# Patient Record
Sex: Female | Born: 1955 | Race: Black or African American | Hispanic: No | Smoking: Former smoker
Health system: Southern US, Community
[De-identification: ages and names within clinical notes are randomized; demographics above are authoritative.]

## PROBLEM LIST (undated history)

## (undated) DIAGNOSIS — E119 Type 2 diabetes mellitus without complications: Secondary | ICD-10-CM

## (undated) DIAGNOSIS — I10 Essential (primary) hypertension: Secondary | ICD-10-CM

## (undated) DIAGNOSIS — H269 Unspecified cataract: Secondary | ICD-10-CM

## (undated) DIAGNOSIS — G40909 Epilepsy, unspecified, not intractable, without status epilepticus: Secondary | ICD-10-CM

## (undated) DIAGNOSIS — Z9289 Personal history of other medical treatment: Secondary | ICD-10-CM

## (undated) DIAGNOSIS — M109 Gout, unspecified: Secondary | ICD-10-CM

## (undated) DIAGNOSIS — R569 Unspecified convulsions: Secondary | ICD-10-CM

## (undated) DIAGNOSIS — E785 Hyperlipidemia, unspecified: Secondary | ICD-10-CM

## (undated) HISTORY — PX: CATARACT EXTRACTION: SUR2

## (undated) HISTORY — PX: CATARACT EXTRACTION W/ INTRAOCULAR LENS  IMPLANT, BILATERAL: SHX1307

## (undated) HISTORY — PX: APPENDECTOMY: SHX54

## (undated) HISTORY — DX: Hyperlipidemia, unspecified: E78.5

## (undated) HISTORY — PX: TUBAL LIGATION: SHX77

## (undated) HISTORY — PX: ABDOMINAL HYSTERECTOMY: SHX81

## (undated) HISTORY — DX: Unspecified cataract: H26.9

---

## 1998-09-15 ENCOUNTER — Ambulatory Visit (HOSPITAL_COMMUNITY): Admission: RE | Admit: 1998-09-15 | Discharge: 1998-09-15 | Payer: Self-pay | Admitting: Internal Medicine

## 1998-09-15 ENCOUNTER — Encounter: Payer: Self-pay | Admitting: Internal Medicine

## 2001-05-31 ENCOUNTER — Emergency Department (HOSPITAL_COMMUNITY): Admission: EM | Admit: 2001-05-31 | Discharge: 2001-05-31 | Payer: Self-pay | Admitting: Emergency Medicine

## 2002-06-29 ENCOUNTER — Emergency Department (HOSPITAL_COMMUNITY): Admission: EM | Admit: 2002-06-29 | Discharge: 2002-06-30 | Payer: Self-pay | Admitting: Emergency Medicine

## 2003-03-01 ENCOUNTER — Other Ambulatory Visit: Admission: RE | Admit: 2003-03-01 | Discharge: 2003-03-01 | Payer: Self-pay | Admitting: Obstetrics and Gynecology

## 2005-04-05 ENCOUNTER — Inpatient Hospital Stay (HOSPITAL_COMMUNITY): Admission: EM | Admit: 2005-04-05 | Discharge: 2005-04-06 | Payer: Self-pay | Admitting: Emergency Medicine

## 2005-04-06 ENCOUNTER — Encounter (INDEPENDENT_AMBULATORY_CARE_PROVIDER_SITE_OTHER): Payer: Self-pay | Admitting: Cardiovascular Disease

## 2008-01-25 ENCOUNTER — Observation Stay (HOSPITAL_COMMUNITY): Admission: EM | Admit: 2008-01-25 | Discharge: 2008-01-26 | Payer: Self-pay | Admitting: Emergency Medicine

## 2008-02-02 ENCOUNTER — Ambulatory Visit: Payer: Self-pay | Admitting: *Deleted

## 2008-02-09 ENCOUNTER — Encounter (INDEPENDENT_AMBULATORY_CARE_PROVIDER_SITE_OTHER): Payer: Self-pay | Admitting: Internal Medicine

## 2008-02-09 ENCOUNTER — Ambulatory Visit: Payer: Self-pay | Admitting: Family Medicine

## 2008-03-10 ENCOUNTER — Ambulatory Visit: Payer: Self-pay | Admitting: Family Medicine

## 2008-03-12 ENCOUNTER — Ambulatory Visit: Payer: Self-pay | Admitting: Family Medicine

## 2008-04-01 ENCOUNTER — Ambulatory Visit: Payer: Self-pay | Admitting: Internal Medicine

## 2008-04-19 ENCOUNTER — Encounter: Payer: Self-pay | Admitting: Family Medicine

## 2008-04-19 ENCOUNTER — Ambulatory Visit: Payer: Self-pay | Admitting: Internal Medicine

## 2008-04-19 LAB — CONVERTED CEMR LAB
ALT: 10 units/L (ref 0–35)
Albumin: 4.2 g/dL (ref 3.5–5.2)
Basophils Relative: 0 % (ref 0–1)
Calcium: 8.7 mg/dL (ref 8.4–10.5)
Carbamazepine Lvl: 2.8 ug/mL — ABNORMAL LOW (ref 4.0–12.0)
Creatinine, Ser: 0.59 mg/dL (ref 0.40–1.20)
HCT: 37.3 % (ref 36.0–46.0)
Hemoglobin: 12 g/dL (ref 12.0–15.0)
Lymphs Abs: 2.3 10*3/uL (ref 0.7–4.0)
MCHC: 32.2 g/dL (ref 30.0–36.0)
MCV: 84.8 fL (ref 78.0–100.0)
Monocytes Absolute: 0.4 10*3/uL (ref 0.1–1.0)
Monocytes Relative: 6 % (ref 3–12)
Neutro Abs: 3.6 10*3/uL (ref 1.7–7.7)
Platelets: 298 10*3/uL (ref 150–400)
Potassium: 3.8 meq/L (ref 3.5–5.3)
RDW: 13.9 % (ref 11.5–15.5)
Sodium: 141 meq/L (ref 135–145)
Total Protein: 7 g/dL (ref 6.0–8.3)
WBC: 6.6 10*3/uL (ref 4.0–10.5)

## 2008-04-20 ENCOUNTER — Encounter: Payer: Self-pay | Admitting: Family Medicine

## 2008-06-14 ENCOUNTER — Ambulatory Visit: Payer: Self-pay | Admitting: *Deleted

## 2008-10-22 ENCOUNTER — Ambulatory Visit: Payer: Self-pay | Admitting: Family Medicine

## 2009-01-13 ENCOUNTER — Ambulatory Visit: Payer: Self-pay | Admitting: Family Medicine

## 2009-01-13 LAB — CONVERTED CEMR LAB
ALT: 25 units/L (ref 0–35)
Albumin: 4.4 g/dL (ref 3.5–5.2)
Alkaline Phosphatase: 71 units/L (ref 39–117)
BUN: 12 mg/dL (ref 6–23)
Basophils Absolute: 0 10*3/uL (ref 0.0–0.1)
Basophils Relative: 1 % (ref 0–1)
CO2: 25 meq/L (ref 19–32)
Calcium: 9.5 mg/dL (ref 8.4–10.5)
Carbamazepine Lvl: <0.3 ug/mL — ABNORMAL LOW (ref 4.0–12.0)
Chloride: 104 meq/L (ref 96–112)
Chloride: 107 meq/L (ref 96–112)
Eosinophils Relative: 2 % (ref 0–5)
Eosinophils Relative: 7 % — ABNORMAL HIGH (ref 0–5)
HCT: 41.2 % (ref 36.0–46.0)
HDL: 60 mg/dL (ref >39)
HDL: 79 mg/dL (ref >39)
Hemoglobin: 12.4 g/dL (ref 12.0–15.0)
LDL Cholesterol: 141 mg/dL — ABNORMAL HIGH (ref 0–99)
Lymphs Abs: 2.3 10*3/uL (ref 0.7–4.0)
MCHC: 30.1 g/dL (ref 30.0–36.0)
Monocytes Absolute: 0.3 10*3/uL (ref 0.1–1.0)
Monocytes Absolute: 0.5 10*3/uL (ref 0.1–1.0)
Monocytes Relative: 6 % (ref 3–12)
Neutro Abs: 2.4 10*3/uL (ref 1.7–7.7)
Neutro Abs: 3.1 10*3/uL (ref 1.7–7.7)
Neutrophils Relative %: 44 % (ref 43–77)
Neutrophils Relative %: 52 % (ref 43–77)
RBC: 4.4 M/uL (ref 3.87–5.11)
RDW: 13.6 % (ref 11.5–15.5)
RDW: 14.1 % (ref 11.5–15.5)
Sodium: 143 meq/L (ref 135–145)
TSH: 1.806 microintl units/mL (ref 0.350–4.50)
Total Bilirubin: 0.4 mg/dL (ref 0.3–1.2)
Total Protein: 7.2 g/dL (ref 6.0–8.3)
Triglycerides: 111 mg/dL (ref <150)
Triglycerides: 151 mg/dL — ABNORMAL HIGH (ref <150)
WBC: 6.1 10*3/uL (ref 4.0–10.5)

## 2009-02-04 ENCOUNTER — Ambulatory Visit: Payer: Self-pay | Admitting: Internal Medicine

## 2009-03-03 ENCOUNTER — Emergency Department (HOSPITAL_COMMUNITY): Admission: EM | Admit: 2009-03-03 | Discharge: 2009-03-03 | Payer: Self-pay | Admitting: Emergency Medicine

## 2009-03-18 ENCOUNTER — Ambulatory Visit: Payer: Self-pay | Admitting: Family Medicine

## 2009-03-24 HISTORY — PX: CHOLECYSTECTOMY: SHX55

## 2009-03-30 ENCOUNTER — Encounter (INDEPENDENT_AMBULATORY_CARE_PROVIDER_SITE_OTHER): Payer: Self-pay | Admitting: General Surgery

## 2009-03-30 ENCOUNTER — Inpatient Hospital Stay (HOSPITAL_COMMUNITY): Admission: EM | Admit: 2009-03-30 | Discharge: 2009-04-05 | Payer: Self-pay | Admitting: Emergency Medicine

## 2009-04-22 ENCOUNTER — Ambulatory Visit: Payer: Self-pay | Admitting: Internal Medicine

## 2009-06-29 ENCOUNTER — Ambulatory Visit (HOSPITAL_COMMUNITY): Admission: RE | Admit: 2009-06-29 | Discharge: 2009-06-29 | Payer: Self-pay | Admitting: Family Medicine

## 2009-06-29 ENCOUNTER — Ambulatory Visit: Payer: Self-pay | Admitting: Cardiovascular Disease

## 2009-06-29 ENCOUNTER — Encounter (INDEPENDENT_AMBULATORY_CARE_PROVIDER_SITE_OTHER): Payer: Self-pay | Admitting: Obstetrics & Gynecology

## 2009-07-06 ENCOUNTER — Ambulatory Visit: Payer: Self-pay | Admitting: Internal Medicine

## 2009-07-14 ENCOUNTER — Ambulatory Visit: Payer: Self-pay | Admitting: Internal Medicine

## 2009-07-18 ENCOUNTER — Ambulatory Visit: Payer: Self-pay | Admitting: Internal Medicine

## 2009-09-12 ENCOUNTER — Ambulatory Visit: Payer: Self-pay | Admitting: Family Medicine

## 2009-09-21 ENCOUNTER — Ambulatory Visit: Payer: Self-pay | Admitting: *Deleted

## 2009-12-19 ENCOUNTER — Emergency Department (HOSPITAL_COMMUNITY): Admission: EM | Admit: 2009-12-19 | Discharge: 2009-12-19 | Payer: Self-pay | Admitting: Emergency Medicine

## 2009-12-26 ENCOUNTER — Emergency Department (HOSPITAL_COMMUNITY): Admission: EM | Admit: 2009-12-26 | Discharge: 2009-12-26 | Payer: Self-pay | Admitting: Family Medicine

## 2009-12-26 ENCOUNTER — Observation Stay (HOSPITAL_COMMUNITY): Admission: EM | Admit: 2009-12-26 | Discharge: 2009-12-27 | Payer: Self-pay | Admitting: Emergency Medicine

## 2009-12-28 ENCOUNTER — Emergency Department (HOSPITAL_COMMUNITY): Admission: EM | Admit: 2009-12-28 | Discharge: 2009-12-28 | Payer: Self-pay | Admitting: Emergency Medicine

## 2010-01-10 ENCOUNTER — Ambulatory Visit: Payer: Self-pay | Admitting: Family Medicine

## 2010-01-12 ENCOUNTER — Ambulatory Visit: Payer: Self-pay | Admitting: Internal Medicine

## 2010-01-16 ENCOUNTER — Encounter (INDEPENDENT_AMBULATORY_CARE_PROVIDER_SITE_OTHER): Payer: Self-pay | Admitting: Adult Health

## 2010-01-16 ENCOUNTER — Ambulatory Visit: Payer: Self-pay | Admitting: Family Medicine

## 2010-01-16 LAB — CONVERTED CEMR LAB
AST: 36 units/L (ref 0–37)
Albumin: 4.8 g/dL (ref 3.5–5.2)
Alkaline Phosphatase: 75 units/L (ref 39–117)
BUN: 17 mg/dL (ref 6–23)
Chloride: 104 meq/L (ref 96–112)
Creatinine, Ser: 0.7 mg/dL (ref 0.40–1.20)
Hemoglobin: 12.2 g/dL (ref 12.0–15.0)
LDL Cholesterol: 70 mg/dL (ref 0–99)
Lymphs Abs: 2.7 10*3/uL (ref 0.7–4.0)
MCV: 83 fL (ref 78.0–100.0)
Monocytes Absolute: 0.4 10*3/uL (ref 0.1–1.0)
Neutrophils Relative %: 40 % — ABNORMAL LOW (ref 43–77)
Platelets: 344 10*3/uL (ref 150–400)
Potassium: 4.2 meq/L (ref 3.5–5.3)
RBC: 4.7 M/uL (ref 3.87–5.11)
RDW: 13.3 % (ref 11.5–15.5)
TSH: 0.731 microintl units/mL (ref 0.350–4.500)
WBC: 5.5 10*3/uL (ref 4.0–10.5)

## 2010-02-13 ENCOUNTER — Ambulatory Visit: Payer: Self-pay | Admitting: Internal Medicine

## 2010-03-01 ENCOUNTER — Encounter (INDEPENDENT_AMBULATORY_CARE_PROVIDER_SITE_OTHER): Payer: Self-pay | Admitting: Adult Health

## 2010-03-01 ENCOUNTER — Ambulatory Visit: Payer: Self-pay | Admitting: Internal Medicine

## 2010-03-01 LAB — CONVERTED CEMR LAB: TSH: 0.726 microintl units/mL (ref 0.350–4.500)

## 2010-03-22 ENCOUNTER — Ambulatory Visit: Payer: Self-pay | Admitting: Internal Medicine

## 2010-06-16 ENCOUNTER — Emergency Department (HOSPITAL_COMMUNITY): Admission: EM | Admit: 2010-06-16 | Discharge: 2010-06-16 | Payer: Self-pay | Admitting: Emergency Medicine

## 2010-07-19 ENCOUNTER — Emergency Department (HOSPITAL_COMMUNITY): Admission: EM | Admit: 2010-07-19 | Discharge: 2010-07-20 | Payer: Self-pay | Admitting: Emergency Medicine

## 2010-07-22 ENCOUNTER — Inpatient Hospital Stay (HOSPITAL_COMMUNITY): Admission: EM | Admit: 2010-07-22 | Discharge: 2010-07-23 | Payer: Self-pay | Admitting: Emergency Medicine

## 2010-08-14 ENCOUNTER — Ambulatory Visit: Payer: Self-pay | Admitting: Internal Medicine

## 2010-09-05 ENCOUNTER — Ambulatory Visit: Payer: Self-pay | Admitting: Internal Medicine

## 2010-09-05 LAB — CONVERTED CEMR LAB
BUN: 11 mg/dL (ref 6–23)
CO2: 27 meq/L (ref 19–32)
Calcium: 9.3 mg/dL (ref 8.4–10.5)
Chloride: 102 meq/L (ref 96–112)
Glucose, Bld: 143 mg/dL — ABNORMAL HIGH (ref 70–99)
Magnesium: 1.6 mg/dL (ref 1.5–2.5)
Potassium: 4.2 meq/L (ref 3.5–5.3)
Sodium: 141 meq/L (ref 135–145)

## 2010-09-09 ENCOUNTER — Emergency Department (HOSPITAL_COMMUNITY): Admission: EM | Admit: 2010-09-09 | Discharge: 2010-09-09 | Payer: Self-pay | Admitting: Family Medicine

## 2010-10-10 ENCOUNTER — Ambulatory Visit: Payer: Self-pay | Admitting: Internal Medicine

## 2011-01-15 ENCOUNTER — Encounter: Payer: Self-pay | Admitting: Family Medicine

## 2011-01-22 ENCOUNTER — Encounter (INDEPENDENT_AMBULATORY_CARE_PROVIDER_SITE_OTHER): Payer: Self-pay | Admitting: *Deleted

## 2011-01-22 LAB — CONVERTED CEMR LAB
AST: 13 units/L (ref 0–37)
Alkaline Phosphatase: 65 units/L (ref 39–117)
Calcium: 9.2 mg/dL (ref 8.4–10.5)
Carbamazepine Lvl: <0.3 ug/mL — ABNORMAL LOW (ref 4.0–12.0)
Chloride: 102 meq/L (ref 96–112)
Cholesterol: 214 mg/dL — ABNORMAL HIGH (ref 0–200)
Creatinine, Ser: 0.58 mg/dL (ref 0.40–1.20)
HDL: 57 mg/dL (ref >39)
Total Bilirubin: 0.3 mg/dL (ref 0.3–1.2)
Total CHOL/HDL Ratio: 3.8

## 2011-02-17 ENCOUNTER — Emergency Department (HOSPITAL_COMMUNITY)
Admission: EM | Admit: 2011-02-17 | Discharge: 2011-02-17 | Disposition: A | Discharge status: Home / Self Care | Payer: Medicare Other | Attending: Emergency Medicine | Admitting: Emergency Medicine

## 2011-02-17 DIAGNOSIS — E119 Type 2 diabetes mellitus without complications: Secondary | ICD-10-CM | POA: Insufficient documentation

## 2011-02-17 DIAGNOSIS — Z79899 Other long term (current) drug therapy: Secondary | ICD-10-CM | POA: Insufficient documentation

## 2011-02-17 DIAGNOSIS — K117 Disturbances of salivary secretion: Secondary | ICD-10-CM | POA: Insufficient documentation

## 2011-02-17 DIAGNOSIS — F329 Major depressive disorder, single episode, unspecified: Secondary | ICD-10-CM | POA: Insufficient documentation

## 2011-02-17 DIAGNOSIS — I10 Essential (primary) hypertension: Secondary | ICD-10-CM | POA: Insufficient documentation

## 2011-02-17 DIAGNOSIS — R5381 Other malaise: Secondary | ICD-10-CM | POA: Insufficient documentation

## 2011-02-17 DIAGNOSIS — R5383 Other fatigue: Secondary | ICD-10-CM | POA: Insufficient documentation

## 2011-02-17 DIAGNOSIS — E7800 Pure hypercholesterolemia, unspecified: Secondary | ICD-10-CM | POA: Insufficient documentation

## 2011-02-17 DIAGNOSIS — F3289 Other specified depressive episodes: Secondary | ICD-10-CM | POA: Insufficient documentation

## 2011-02-17 DIAGNOSIS — G40909 Epilepsy, unspecified, not intractable, without status epilepticus: Secondary | ICD-10-CM | POA: Insufficient documentation

## 2011-02-17 LAB — CBC
HCT: 35.9 % — ABNORMAL LOW (ref 36.0–46.0)
Hemoglobin: 11.7 g/dL — ABNORMAL LOW (ref 12.0–15.0)
MCHC: 32.6 g/dL (ref 30.0–36.0)
MCV: 81.2 fL (ref 78.0–100.0)
Platelets: 304 10*3/uL (ref 150–400)
WBC: 6.1 10*3/uL (ref 4.0–10.5)

## 2011-02-17 LAB — BASIC METABOLIC PANEL
BUN: 10 mg/dL (ref 6–23)
CO2: 23 mEq/L (ref 19–32)
GFR calc Af Amer: >60 mL/min (ref >60)
GFR calc non Af Amer: >60 mL/min (ref >60)

## 2011-02-17 LAB — POCT CARDIAC MARKERS
CKMB, poc: <1 ng/mL — ABNORMAL LOW (ref 1.0–8.0)
Troponin i, poc: <0.05 ng/mL (ref 0.00–0.09)

## 2011-02-17 LAB — GLUCOSE, CAPILLARY: Glucose-Capillary: 265 mg/dL — ABNORMAL HIGH (ref 70–99)

## 2011-02-17 LAB — CARBAMAZEPINE LEVEL, TOTAL: Carbamazepine Lvl: <2 ug/mL — ABNORMAL LOW (ref 4.0–12.0)

## 2011-02-17 LAB — DIFFERENTIAL: Neutrophils Relative %: 41 % — ABNORMAL LOW (ref 43–77)

## 2011-03-08 LAB — GLUCOSE, CAPILLARY: Glucose-Capillary: 153 mg/dL — ABNORMAL HIGH (ref 70–99)

## 2011-03-10 LAB — BASIC METABOLIC PANEL
Calcium: 9.6 mg/dL (ref 8.4–10.5)
GFR calc Af Amer: >60 mL/min (ref >60)
GFR calc non Af Amer: >60 mL/min (ref >60)
Sodium: 137 mEq/L (ref 135–145)

## 2011-03-10 LAB — GLUCOSE, CAPILLARY
Glucose-Capillary: 174 mg/dL — ABNORMAL HIGH (ref 70–99)
Glucose-Capillary: 180 mg/dL — ABNORMAL HIGH (ref 70–99)
Glucose-Capillary: 189 mg/dL — ABNORMAL HIGH (ref 70–99)
Glucose-Capillary: 210 mg/dL — ABNORMAL HIGH (ref 70–99)
Glucose-Capillary: 233 mg/dL — ABNORMAL HIGH (ref 70–99)

## 2011-03-10 LAB — CULTURE, BLOOD (ROUTINE X 2)

## 2011-03-10 LAB — HEMOGLOBIN A1C
Hgb A1c MFr Bld: 8.3 % — ABNORMAL HIGH (ref <5.7)
Mean Plasma Glucose: 192 mg/dL — ABNORMAL HIGH (ref <117)

## 2011-03-10 LAB — CARDIAC PANEL(CRET KIN+CKTOT+MB+TROPI)
CK, MB: 1.6 ng/mL (ref 0.3–4.0)
CK, MB: 1.7 ng/mL (ref 0.3–4.0)
Relative Index: 1.7 (ref 0.0–2.5)
Total CK: 103 U/L (ref 7–177)
Troponin I: 0.02 ng/mL (ref 0.00–0.06)

## 2011-03-10 LAB — URINALYSIS, ROUTINE W REFLEX MICROSCOPIC
Bilirubin Urine: NEGATIVE
Specific Gravity, Urine: 1.021 (ref 1.005–1.030)
Urobilinogen, UA: 0.2 mg/dL (ref 0.0–1.0)
pH: 5.5 (ref 5.0–8.0)

## 2011-03-10 LAB — HEPATIC FUNCTION PANEL
AST: 26 U/L (ref 0–37)
Albumin: 3.7 g/dL (ref 3.5–5.2)
Alkaline Phosphatase: 62 U/L (ref 39–117)
Total Bilirubin: 0.4 mg/dL (ref 0.3–1.2)

## 2011-03-10 LAB — DIFFERENTIAL
Lymphs Abs: 5.6 10*3/uL — ABNORMAL HIGH (ref 0.7–4.0)
Monocytes Relative: 10 % (ref 3–12)
Neutro Abs: 10.3 10*3/uL — ABNORMAL HIGH (ref 1.7–7.7)
Neutrophils Relative %: 57 % (ref 43–77)

## 2011-03-10 LAB — CBC
Hemoglobin: 10.9 g/dL — ABNORMAL LOW (ref 12.0–15.0)
RBC: 3.84 MIL/uL — ABNORMAL LOW (ref 3.87–5.11)
WBC: 18.1 10*3/uL — ABNORMAL HIGH (ref 4.0–10.5)

## 2011-03-10 LAB — URINE MICROSCOPIC-ADD ON

## 2011-03-10 LAB — CK TOTAL AND CKMB (NOT AT ARMC)
CK, MB: 1.8 ng/mL (ref 0.3–4.0)
Relative Index: 1.5 (ref 0.0–2.5)
Total CK: 119 U/L (ref 7–177)

## 2011-03-11 LAB — BASIC METABOLIC PANEL
BUN: 8 mg/dL (ref 6–23)
Calcium: 9.3 mg/dL (ref 8.4–10.5)
Chloride: 101 mEq/L (ref 96–112)
GFR calc Af Amer: >60 mL/min (ref >60)
GFR calc Af Amer: >60 mL/min (ref >60)
GFR calc non Af Amer: >60 mL/min (ref >60)
Glucose, Bld: 358 mg/dL — ABNORMAL HIGH (ref 70–99)
Potassium: 3.8 mEq/L (ref 3.5–5.1)
Sodium: 134 mEq/L — ABNORMAL LOW (ref 135–145)

## 2011-03-11 LAB — CBC
HCT: 35.7 % — ABNORMAL LOW (ref 36.0–46.0)
Hemoglobin: 13 g/dL (ref 12.0–15.0)
MCV: 84.8 fL (ref 78.0–100.0)
RBC: 4.21 MIL/uL (ref 3.87–5.11)
RBC: 4.64 MIL/uL (ref 3.87–5.11)
RDW: 13.4 % (ref 11.5–15.5)
WBC: 6.1 10*3/uL (ref 4.0–10.5)

## 2011-03-11 LAB — DIFFERENTIAL
Basophils Absolute: 0 10*3/uL (ref 0.0–0.1)
Eosinophils Absolute: 0.3 10*3/uL (ref 0.0–0.7)
Eosinophils Relative: 5 % (ref 0–5)
Lymphocytes Relative: 43 % (ref 12–46)
Lymphs Abs: 2.7 10*3/uL (ref 0.7–4.0)
Monocytes Absolute: 0.3 10*3/uL (ref 0.1–1.0)
Monocytes Relative: 7 % (ref 3–12)
Neutro Abs: 2.8 10*3/uL (ref 1.7–7.7)

## 2011-03-11 LAB — URINE MICROSCOPIC-ADD ON

## 2011-03-11 LAB — GLUCOSE, CAPILLARY
Glucose-Capillary: 250 mg/dL — ABNORMAL HIGH (ref 70–99)
Glucose-Capillary: 275 mg/dL — ABNORMAL HIGH (ref 70–99)
Glucose-Capillary: 384 mg/dL — ABNORMAL HIGH (ref 70–99)
Glucose-Capillary: 459 mg/dL — ABNORMAL HIGH (ref 70–99)

## 2011-03-11 LAB — URINALYSIS, ROUTINE W REFLEX MICROSCOPIC
Bilirubin Urine: NEGATIVE
Bilirubin Urine: NEGATIVE
Ketones, ur: NEGATIVE mg/dL
Leukocytes, UA: NEGATIVE
Nitrite: NEGATIVE
Nitrite: NEGATIVE
Specific Gravity, Urine: 1.016 (ref 1.005–1.030)
pH: 6 (ref 5.0–8.0)
pH: 6 (ref 5.0–8.0)

## 2011-04-04 LAB — GLUCOSE, CAPILLARY
Glucose-Capillary: 133 mg/dL — ABNORMAL HIGH (ref 70–99)
Glucose-Capillary: 146 mg/dL — ABNORMAL HIGH (ref 70–99)
Glucose-Capillary: 171 mg/dL — ABNORMAL HIGH (ref 70–99)
Glucose-Capillary: 172 mg/dL — ABNORMAL HIGH (ref 70–99)
Glucose-Capillary: 177 mg/dL — ABNORMAL HIGH (ref 70–99)
Glucose-Capillary: 186 mg/dL — ABNORMAL HIGH (ref 70–99)
Glucose-Capillary: 192 mg/dL — ABNORMAL HIGH (ref 70–99)
Glucose-Capillary: 192 mg/dL — ABNORMAL HIGH (ref 70–99)
Glucose-Capillary: 203 mg/dL — ABNORMAL HIGH (ref 70–99)
Glucose-Capillary: 204 mg/dL — ABNORMAL HIGH (ref 70–99)
Glucose-Capillary: 207 mg/dL — ABNORMAL HIGH (ref 70–99)
Glucose-Capillary: 209 mg/dL — ABNORMAL HIGH (ref 70–99)
Glucose-Capillary: 214 mg/dL — ABNORMAL HIGH (ref 70–99)
Glucose-Capillary: 215 mg/dL — ABNORMAL HIGH (ref 70–99)
Glucose-Capillary: 222 mg/dL — ABNORMAL HIGH (ref 70–99)
Glucose-Capillary: 225 mg/dL — ABNORMAL HIGH (ref 70–99)
Glucose-Capillary: 249 mg/dL — ABNORMAL HIGH (ref 70–99)
Glucose-Capillary: 261 mg/dL — ABNORMAL HIGH (ref 70–99)
Glucose-Capillary: 299 mg/dL — ABNORMAL HIGH (ref 70–99)
Glucose-Capillary: 304 mg/dL — ABNORMAL HIGH (ref 70–99)
Glucose-Capillary: 341 mg/dL — ABNORMAL HIGH (ref 70–99)

## 2011-04-04 LAB — URINALYSIS, ROUTINE W REFLEX MICROSCOPIC
Bilirubin Urine: NEGATIVE
Glucose, UA: NEGATIVE mg/dL
Ketones, ur: NEGATIVE mg/dL
Specific Gravity, Urine: 1.017 (ref 1.005–1.030)
pH: 6.5 (ref 5.0–8.0)

## 2011-04-04 LAB — CBC
HCT: 33.6 % — ABNORMAL LOW (ref 36.0–46.0)
HCT: 35.2 % — ABNORMAL LOW (ref 36.0–46.0)
Hemoglobin: 10.9 g/dL — ABNORMAL LOW (ref 12.0–15.0)
Hemoglobin: 11.1 g/dL — ABNORMAL LOW (ref 12.0–15.0)
Hemoglobin: 11.7 g/dL — ABNORMAL LOW (ref 12.0–15.0)
MCHC: 33.4 g/dL (ref 30.0–36.0)
MCHC: 34 g/dL (ref 30.0–36.0)
MCV: 84.7 fL (ref 78.0–100.0)
MCV: 84.7 fL (ref 78.0–100.0)
MCV: 85.3 fL (ref 78.0–100.0)
Platelets: 255 10*3/uL (ref 150–400)
Platelets: 264 10*3/uL (ref 150–400)
Platelets: 272 10*3/uL (ref 150–400)
RBC: 4.15 MIL/uL (ref 3.87–5.11)
RDW: 13 % (ref 11.5–15.5)
RDW: 13.7 % (ref 11.5–15.5)
RDW: 13.8 % (ref 11.5–15.5)
WBC: 7.6 10*3/uL (ref 4.0–10.5)

## 2011-04-04 LAB — COMPREHENSIVE METABOLIC PANEL
ALT: 157 U/L — ABNORMAL HIGH (ref 0–35)
ALT: 93 U/L — ABNORMAL HIGH (ref 0–35)
AST: 214 U/L — ABNORMAL HIGH (ref 0–37)
AST: 62 U/L — ABNORMAL HIGH (ref 0–37)
AST: 79 U/L — ABNORMAL HIGH (ref 0–37)
Albumin: 2.6 g/dL — ABNORMAL LOW (ref 3.5–5.2)
Albumin: 2.8 g/dL — ABNORMAL LOW (ref 3.5–5.2)
Albumin: 3.3 g/dL — ABNORMAL LOW (ref 3.5–5.2)
Albumin: 3.5 g/dL (ref 3.5–5.2)
Alkaline Phosphatase: 213 U/L — ABNORMAL HIGH (ref 39–117)
BUN: 5 mg/dL — ABNORMAL LOW (ref 6–23)
BUN: 7 mg/dL (ref 6–23)
BUN: 9 mg/dL (ref 6–23)
CO2: 26 mEq/L (ref 19–32)
Calcium: 8.3 mg/dL — ABNORMAL LOW (ref 8.4–10.5)
Calcium: 8.6 mg/dL (ref 8.4–10.5)
Calcium: 8.6 mg/dL (ref 8.4–10.5)
Chloride: 101 mEq/L (ref 96–112)
Chloride: 102 mEq/L (ref 96–112)
Creatinine, Ser: 0.57 mg/dL (ref 0.4–1.2)
Creatinine, Ser: 0.58 mg/dL (ref 0.4–1.2)
Creatinine, Ser: 0.75 mg/dL (ref 0.4–1.2)
GFR calc Af Amer: >60 mL/min (ref >60)
GFR calc Af Amer: >60 mL/min (ref >60)
GFR calc Af Amer: >60 mL/min (ref >60)
GFR calc non Af Amer: >60 mL/min (ref >60)
GFR calc non Af Amer: >60 mL/min (ref >60)
Glucose, Bld: 209 mg/dL — ABNORMAL HIGH (ref 70–99)
Glucose, Bld: 226 mg/dL — ABNORMAL HIGH (ref 70–99)
Potassium: 3.2 mEq/L — ABNORMAL LOW (ref 3.5–5.1)
Potassium: 3.5 mEq/L (ref 3.5–5.1)
Sodium: 133 mEq/L — ABNORMAL LOW (ref 135–145)
Sodium: 134 mEq/L — ABNORMAL LOW (ref 135–145)
Sodium: 138 mEq/L (ref 135–145)
Total Bilirubin: 0.4 mg/dL (ref 0.3–1.2)
Total Bilirubin: 0.6 mg/dL (ref 0.3–1.2)
Total Protein: 6.7 g/dL (ref 6.0–8.3)
Total Protein: 6.7 g/dL (ref 6.0–8.3)
Total Protein: 6.7 g/dL (ref 6.0–8.3)
Total Protein: 6.7 g/dL (ref 6.0–8.3)

## 2011-04-04 LAB — URINE MICROSCOPIC-ADD ON

## 2011-04-04 LAB — DIFFERENTIAL
Basophils Absolute: 0 10*3/uL (ref 0.0–0.1)
Eosinophils Relative: 3 % (ref 0–5)
Lymphocytes Relative: 22 % (ref 12–46)
Monocytes Absolute: 0.7 10*3/uL (ref 0.1–1.0)
Monocytes Relative: 10 % (ref 3–12)
Neutro Abs: 4.9 10*3/uL (ref 1.7–7.7)

## 2011-04-04 LAB — POCT I-STAT, CHEM 8
BUN: 7 mg/dL (ref 6–23)
Chloride: 103 mEq/L (ref 96–112)
HCT: 37 % (ref 36.0–46.0)
Sodium: 137 mEq/L (ref 135–145)
TCO2: 24 mmol/L (ref 0–100)

## 2011-04-04 LAB — POCT CARDIAC MARKERS: Myoglobin, poc: 43.9 ng/mL (ref 12–200)

## 2011-04-04 LAB — HEMOGLOBIN A1C
Hgb A1c MFr Bld: 8.3 % — ABNORMAL HIGH (ref 4.6–6.1)
Mean Plasma Glucose: 192 mg/dL

## 2011-04-04 LAB — LIPASE, BLOOD: Lipase: 16 U/L (ref 11–59)

## 2011-04-04 LAB — TYPE AND SCREEN
ABO/RH(D): O POS
Antibody Screen: NEGATIVE

## 2011-04-05 LAB — D-DIMER, QUANTITATIVE: D-Dimer, Quant: 0.25 ug/mL-FEU (ref 0.00–0.48)

## 2011-04-05 LAB — POCT I-STAT, CHEM 8
Calcium, Ion: 1.19 mmol/L (ref 1.12–1.32)
HCT: 38 % (ref 36.0–46.0)
TCO2: 25 mmol/L (ref 0–100)

## 2011-04-11 ENCOUNTER — Emergency Department (HOSPITAL_COMMUNITY)
Admission: EM | Admit: 2011-04-11 | Discharge: 2011-04-12 | Disposition: A | Discharge status: Home / Self Care | Payer: Medicare Other | Attending: Emergency Medicine | Admitting: Emergency Medicine

## 2011-04-11 DIAGNOSIS — G40909 Epilepsy, unspecified, not intractable, without status epilepticus: Secondary | ICD-10-CM | POA: Insufficient documentation

## 2011-04-11 DIAGNOSIS — R110 Nausea: Secondary | ICD-10-CM | POA: Insufficient documentation

## 2011-04-11 DIAGNOSIS — I10 Essential (primary) hypertension: Secondary | ICD-10-CM | POA: Insufficient documentation

## 2011-04-11 DIAGNOSIS — E7800 Pure hypercholesterolemia, unspecified: Secondary | ICD-10-CM | POA: Insufficient documentation

## 2011-04-11 DIAGNOSIS — E1169 Type 2 diabetes mellitus with other specified complication: Secondary | ICD-10-CM | POA: Insufficient documentation

## 2011-04-11 LAB — CBC
HCT: 36.4 % (ref 36.0–46.0)
MCHC: 32.4 g/dL (ref 30.0–36.0)
MCV: 80.4 fL (ref 78.0–100.0)
Platelets: 312 10*3/uL (ref 150–400)
RDW: 13.7 % (ref 11.5–15.5)

## 2011-04-11 LAB — DIFFERENTIAL
Eosinophils Absolute: 0.4 10*3/uL (ref 0.0–0.7)
Eosinophils Relative: 5 % (ref 0–5)
Lymphocytes Relative: 42 % (ref 12–46)
Lymphs Abs: 3.1 10*3/uL (ref 0.7–4.0)
Monocytes Absolute: 0.5 10*3/uL (ref 0.1–1.0)

## 2011-04-11 LAB — GLUCOSE, CAPILLARY: Glucose-Capillary: 401 mg/dL — ABNORMAL HIGH (ref 70–99)

## 2011-04-12 ENCOUNTER — Emergency Department (HOSPITAL_COMMUNITY): Payer: Medicare Other

## 2011-04-12 LAB — BASIC METABOLIC PANEL
BUN: 11 mg/dL (ref 6–23)
Calcium: 9.6 mg/dL (ref 8.4–10.5)
Creatinine, Ser: 0.7 mg/dL (ref 0.4–1.2)
GFR calc non Af Amer: >60 mL/min (ref >60)
Glucose, Bld: 391 mg/dL — ABNORMAL HIGH (ref 70–99)

## 2011-04-12 LAB — URINALYSIS, ROUTINE W REFLEX MICROSCOPIC
Bilirubin Urine: NEGATIVE
Glucose, UA: 500 mg/dL — AB
Ketones, ur: NEGATIVE mg/dL
Nitrite: NEGATIVE
Specific Gravity, Urine: 1.016 (ref 1.005–1.030)
pH: 6 (ref 5.0–8.0)

## 2011-04-12 LAB — POCT CARDIAC MARKERS: CKMB, poc: 1.6 ng/mL (ref 1.0–8.0)

## 2011-04-12 LAB — GLUCOSE, CAPILLARY: Glucose-Capillary: 332 mg/dL — ABNORMAL HIGH (ref 70–99)

## 2011-04-12 LAB — URINE MICROSCOPIC-ADD ON

## 2011-05-08 ENCOUNTER — Emergency Department (HOSPITAL_COMMUNITY)
Admission: EM | Admit: 2011-05-08 | Discharge: 2011-05-08 | Disposition: A | Discharge status: Home / Self Care | Payer: Medicare Other | Attending: Emergency Medicine | Admitting: Emergency Medicine

## 2011-05-08 ENCOUNTER — Ambulatory Visit: Payer: Medicaid Other | Admitting: *Deleted

## 2011-05-08 DIAGNOSIS — G40909 Epilepsy, unspecified, not intractable, without status epilepticus: Secondary | ICD-10-CM | POA: Insufficient documentation

## 2011-05-08 DIAGNOSIS — I10 Essential (primary) hypertension: Secondary | ICD-10-CM | POA: Insufficient documentation

## 2011-05-08 DIAGNOSIS — R350 Frequency of micturition: Secondary | ICD-10-CM | POA: Insufficient documentation

## 2011-05-08 DIAGNOSIS — R5383 Other fatigue: Secondary | ICD-10-CM | POA: Insufficient documentation

## 2011-05-08 DIAGNOSIS — R51 Headache: Secondary | ICD-10-CM | POA: Insufficient documentation

## 2011-05-08 DIAGNOSIS — R5381 Other malaise: Secondary | ICD-10-CM | POA: Insufficient documentation

## 2011-05-08 DIAGNOSIS — E1169 Type 2 diabetes mellitus with other specified complication: Secondary | ICD-10-CM | POA: Insufficient documentation

## 2011-05-08 LAB — POCT I-STAT, CHEM 8
Chloride: 103 mEq/L (ref 96–112)
HCT: 34 % — ABNORMAL LOW (ref 36.0–46.0)
HCT: 33 % — ABNORMAL LOW (ref 36.0–46.0)
Hemoglobin: 11.2 g/dL — ABNORMAL LOW (ref 12.0–15.0)
Potassium: 3.9 mEq/L (ref 3.5–5.1)
Potassium: 4.1 mEq/L (ref 3.5–5.1)
Sodium: 139 mEq/L (ref 135–145)

## 2011-05-08 LAB — GLUCOSE, CAPILLARY
Glucose-Capillary: 466 mg/dL — ABNORMAL HIGH (ref 70–99)
Glucose-Capillary: 393 mg/dL — ABNORMAL HIGH (ref 70–99)
Glucose-Capillary: 362 mg/dL — ABNORMAL HIGH (ref 70–99)

## 2011-05-08 LAB — URINALYSIS, ROUTINE W REFLEX MICROSCOPIC
Bilirubin Urine: NEGATIVE
Glucose, UA: >1000 mg/dL — AB
Ketones, ur: NEGATIVE mg/dL
Nitrite: NEGATIVE
Protein, ur: NEGATIVE mg/dL
pH: 6 (ref 5.0–8.0)

## 2011-05-08 LAB — BASIC METABOLIC PANEL
Calcium: 9.7 mg/dL (ref 8.4–10.5)
GFR calc Af Amer: >60 mL/min (ref >60)
GFR calc non Af Amer: >60 mL/min (ref >60)
Sodium: 134 mEq/L — ABNORMAL LOW (ref 135–145)

## 2011-05-08 LAB — URINE MICROSCOPIC-ADD ON

## 2011-05-08 NOTE — Consult Note (Signed)
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   HISTORY OF PRESENT ILLNESS:  This is a 55 year old African American lady  with a history of complex partial seizure disorder since 89 to 1993  following recurrence events.  Spells were described as staring and  inattentiveness followed by drowsiness and sedation.  The patient is not  aware that she has had a seizure, when she becomes lucid again.  She has  been having multiple seizures per week and it is not unusual for her to  have multiple seizures on a single day.  Unfortunately, the patient has  also continued to operate a motor vehicle from time to time.  She has  been followed at [facility].  She is on Tegretol 400 mg t.i.d.  No  recent Tegretol level has been obtained according to the patient and  family.   PAST MEDICAL HISTORY:  Remarkable for diabetes mellitus, hypertension,  hyperlipidemia, obesity, and complex partial seizure disorder.   CURRENT MEDICATIONS:  1. Amlodipine 10 mg per day.  2. Lisinopril 20 mg per day.  3. Hydrochlorothiazide 25 mg per day.  4. Tegretol 400 mg t.i.d.  5. Insulin sliding scale at current admission.   Outpatient medications:  Glucotrol, Norvasc, Flexeril p.r.n., lisinopril  as above, hydrochlorothiazide as above, tramadol p.r.n. pain, metformin  and Tegretol as above.   FAMILY HISTORY:  Positive for diabetes mellitus, but otherwise  noncontributory.   PHYSICAL EXAMINATION:  GENERAL:  This is an obese middle-aged appearing  lady who is alert and cooperative, in no acute distress.  She is well  oriented to time as  well as place.  Short-term and long-term memory are  normal.  Affect was appropriate.  HEENT:  Pupils, extraocular movements and visual fields were normal.  There was no facial weakness.  Hearing was normal.  Speech and  palliative movement were normal.  NEUROLOGIC:  Coordination was normal.  Strength and muscle tone were  normal throughout.  Deep tendon reflexes were normal and symmetrical  throughout except for absent ankle reflexes.  Plantar responses were  flexor.  Sensory examination was normal.  Carotid auscultation was  normal.   CLINICAL IMPRESSION:  Complex partial seizure disorder, now well  controlled, despite.   RECOMMENDATIONS:  1. We will obtain Tegretol level this afternoon.  2. We will increase Tegretol if level is subtherapeutic.  We will add      a second anticonvulsant medication if Tegretol level is normal.  3. Strongly recommended patient not to do any driving until seizures      are completely controlled for minimum of 6 months.   Thank you for asking me to evaluate Dawn Rice.      Noel Christmas, MD  Electronically Signed     CS/MEDQ  D:  04/03/2009  T:  04/04/2009  Job:  [id number]
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
## 2011-05-08 NOTE — Op Note (Signed)
Dawn Rice            ACCOUNT NO.:  [id number]   MEDICAL RECORD NO.:  [id number]          PATIENT TYPE:  INP   LOCATION:  5123                         FACILITY:  [facility]   PHYSICIAN:  Anselm Pancoast. Weatherly, M.D.DATE OF BIRTH:  [birth year]   DATE OF PROCEDURE:  03/30/2009  DATE OF DISCHARGE:                               OPERATIVE REPORT   PREOPERATIVE DIAGNOSIS:  Acute cholecystitis.   POSTOPERATIVE DIAGNOSIS:  Acute cholecystitis.   OPERATIONS:  Laparoscopic cholecystectomy with cholangiogram.   SURGEON:  Anselm Pancoast. Zachery Dakins, MD   ASSISTANT:  Wilmon Arms. Corliss Skains, MD   ANESTHESIA:  General anesthesia.   HISTORY:  Dawn Rice is a 55 year old overweight black female who  was admitted to the emergency room last evening with acute right upper  quadrant pain.  She is a diabetic.  She is on antihypertensive  medications.  Said she has had repeat episodes of pain, but had just not  thought to have this evaluated.  She was seen by Dr. Luisa Rice and  admitted approximately midnight, placed on antibiotics, kept n.p.o., and  I was asked to see this morning.  She is mildly tender in the right  upper quadrant and is in agreement to proceed on with a laparoscopic  gallbladder.  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   The patient has been on Cipro and was taken to the operative suite.  No  additional antibiotics and she had had a dose approximately 4 hours  earlier and the induction of general anesthesia, endotracheal tube, oral  tube into the stomach.  She is quite heavy and short and a small  incision was made below the umbilicus.  It was necessary to use  appendiceal instead of [employer].  The fascia could be picked up and  very carefully tried to enter into the peritoneal cavity and the Hasson  cannula was really not in the true peritoneal cavity, but was kind  extraperitoneal.  We withdrew  the Hasson, picked up the peritoneum, and  made a small opening and then put this cannula back in.  The exposure  was very poor.  We were getting some leakage around the umbilicus, could  not get really a good airtight closure.  The anesthesiologist going to  deepen the anesthesia and then we were able to put the upper 10 mL  trocar in under direct vision and the two lateral 5-mm trocars in the  appropriate position.  Still with 30-degree scope, the gallbladder was,  you could visualize it had inflammatory peel around it.  We really were  just not getting good exposure even with a 30-degree scope, and we felt  size ofpatient needed change location camera.  I went ahead and put a  suture to kind to close the fascia at the umbilicus, so I could use a  OptiView direct vision up about 2 inches higher and this would allow [place]  to get kind of over the fatty omentum and colon to get a better  exposure.  With this being done, still the visualization  was poor.  She  has got a fatty liver and we ultimately went and put a squiggly through  a fifth port, so we could push down on the omentum.  On trying to  dissect the gallbladder, we got a little rent  with the one of the  million dollar graspers, sucked out as these are numerous little tiny  cholesterol stones.  We could hold the little opening with the grasper  and then with the wiggly could see the proximal portion of the  gallbladder finally could get this encompassed with a right-angle, put a  clip on the junction of the cystic duct of gallbladder, small opening  and then a Cook catheter and a cholangiogram was obtained.  There is  flow that does go into the duodenum probably about a 2 cm cystic duct.  We do not see any obvious stones in the common bile duct.  I then  removed the catheter, triply clipped the cystic duct, and divided it.  We could visualize the cystic artery that was doubly clipped proximally,  singly, distally, and divided and  then the posterior branch of the  cystic artery could be visualized and this was doubly clipped,  proximally, singly, and then freed up the gallbladder from its bed.  On  the most distal portion, it was nearly intrahepatic and difficult, more  for the exposure than actually the inflammation of the gallbladder wall.  Finally, the gallbladder was freed, we put it in an EndoCatch bag.  We  irrigated and aspirated, saw the little stones that had fallen.  We  think we had aspirated and they do float and we then opened the stitch  that I had used to close the original incision that was a little bigger  than the OptiView and then could pull out this bag containing the  gallbladder.  The bigger stones was difficult to get through the fascia  and finally it slipped on throughout with the bag.  Next, I closed the  fascia at the umbilicus or below the umbilicus with 0 Prolene and then  placed the Blake drain through the lateral 5-mm port up in the  gallbladder fossa.  We had irrigated and aspirated and cauterized a  couple of little bleeders in the liver bed and it appear that the  hemostasis is good.  The drain was then placed through the most lateral  5-mm port and then the 5-mm ports withdrawn, OptiView port withdrawn,  and I did not put any other additional fascia sutures.  The subcutaneous  tissue was closed with 4-0 Vicryl, benzoin, and Steri-Strips.  I am  going to keep her on Cipro.  We will watch her diabetes.  She may not be  ready to go home tomorrow, but she will hopefully the following day and  we will remove the drain in the office probably on Monday.      Anselm Pancoast. Zachery Dakins, M.D.  Electronically Signed     WJW/MEDQ  D:  03/30/2009  T:  03/31/2009  Job:  [id number]

## 2011-05-08 NOTE — Consult Note (Signed)
Dawn Rice, Dawn Rice            ACCOUNT NO.:  [id number]   MEDICAL RECORD NO.:  [id number]          PATIENT TYPE:  INP   LOCATION:  5123                         FACILITY:  [facility]   PHYSICIAN:  Clovis Pu. Cornett, M.D.DATE OF BIRTH:  July 31, 1956   DATE OF CONSULTATION:  03/30/2009  DATE OF DISCHARGE:                                 CONSULTATION   REQUESTING PHYSICIAN:  Rhae Lerner. Margretta Ditty, M.D.   REASON FOR CONSULTATION:  Abdominal pain.   HISTORY OF PRESENT ILLNESS:  The patient is a 55 year old female with a  3-day history of right upper quadrant pain.  The pain is severe, located  in the right upper quadrant with radiation to her right upper back.  The  pain has been constant at 10/10, keeping her up at night.  Nothing seems  to make it better.  It is unclear if any sort of food has brought this  on.  She was seen tonight in the emergency room here at [facility].  I was asked to consult at the request of Dr. Margretta Ditty for this.  She is  complaining of the pain still being quite severe in right upper quadrant  and radiation to her right scapular region.  Nothing seems to make it  better or worse.  It is associated with some nausea.   PAST MEDICAL HISTORY:  1. Type 2 diabetes mellitus.  2. Hypertension.  3. Morbid obesity.  4. Seizure disorder.   PAST SURGICAL HISTORY:  None.   FAMILY HISTORY:  Noncontributory this admission.   SOCIAL HISTORY:  Denies tobacco or alcohol use.   ALLERGIES:  None.   MEDICATIONS:  1. Amlodipine 10 mg a day.  2. Lisinopril and hydrochlorothiazide 20/25 mg daily.   REVIEW OF SYSTEMS:  Negative x15 except for that as stated above.   PHYSICAL EXAMINATION:  VITAL SIGNS:  Temperature is 98.2, pulse 78,  blood pressure 151/91.  HEENT:  Extraocular movements are intact.  No evidence of jaundice.  NECK:  Supple, nontender.  Trachea midline.  PULMONARY:  Lungs clear to auscultation.  Chest wall motion normal.  CARDIOVASCULAR:  Regular rate and  rhythm without rub, murmur, or gallop.  EXTREMITIES:  Warm, well perfused.  Muscle tone and range of motion are  normal.  ABDOMEN:  Tender in the right upper quadrant.  Positive Murphy sign.  No  masses or hernia.  NEUROLOGIC:  Glasgow coma scale is 15.  Motor and sensory functions are  grossly intact.   DIAGNOSTIC STUDIES:  I reviewed her ultrasound report, which shows  gallstones with thickened gallbladder wall consistent with acute  cholecystitis.  Common duct not well visualized due to her obesity.   LABORATORY STUDIES:  White count is 7600 without left shift, hemoglobin  11.7, platelet count is 258,000.  Sodium 137, potassium 3.6, glucose  145, chloride 103, BUN 7, creatinine 0.7.  UA is normal.  Lipase is 16,  AST is 214, ALT is 285.   IMPRESSION:  1. Acute cholecystitis.  2. Diabetes mellitus type 2.  3. Hypertension.   PLAN:  She will be admitted for IV fluids, antibiotics.  She will  need  cholecystectomy in the morning.  I have discussed this with the patient  and she understands.      Thomas A. Cornett, M.D.  Electronically Signed     TAC/MEDQ  D:  03/30/2009  T:  03/30/2009  Job:  [id number]   cc:   Rhae Lerner. Margretta Ditty, M.D.

## 2011-05-08 NOTE — Discharge Summary (Signed)
[name], APPLE            ACCOUNT NO.:  [id number]   MEDICAL RECORD NO.:  [id number]          PATIENT TYPE:  INP   LOCATION:  3732                         FACILITY:  [facility]   PHYSICIAN:  Lucita Ferrara, MD         DATE OF BIRTH:  [birth year]   DATE OF ADMISSION:  01/24/2008  DATE OF DISCHARGE:                               DISCHARGE SUMMARY   This is a continuation of a previously dictated report.   For hyperlipidemia, the patient was started on Lipitor 20 mg p.o. daily.  LDL was checked.  LDL was 157.  Total cholesterol was 230.  Patient was advised on diet and exercise.   For DVT prophylaxis, patient was started on Lovenox.   Hospital course was otherwise unremarkable.  Patient was recommended to  follow up with her primary care doctor at [facility].   DISCHARGE MEDICATIONS:  Today, she is going to be discharged home with  the following medications:  1. Protonix 40 mg p.o. daily.  2. Aspirin 325 mg p.o. daily.  3. Norvasc 10 mg p.o. daily.  4. Lisinopril 20 mg p.o. daily.  5. Metformin 500 mg p.o. b.i.d.   Patient is advised to follow up with her primary care physician in  regards to diabetes control and potentially starting on an insulin  regimen at home.  This needs to be discussed in an outpatient setting.  Given her uncontrolled diabetes and hemoglobin A1C, which is greater  than 7.   ACTIVITY:  No restrictions of activities.  Patient is advised to  exercise.   DIET:  Heart-healthy, low cholesterol diet.   FOLLOW UP:  Patient is advised to follow up with primary care physician  in regards to the above issues.  Also follow up with cardiology in  regards to her stress test, which showed some changes, but no  recommendations on acute intervention in the hospital now.   On the day of discharge, the patient is hemodynamically stable.  Blood  pressure is 112/72.  Temperature 97.1, pulse 62, respirations 18, pulse  ox 97% on room air.   CBGs today are well  controlled at 149, 129.   PHYSICAL EXAMINATION:  HEENT:  Normocephalic and atraumatic.  Sclerae  are anicteric.  NECK:  Supple.  No JVD.  No carotid bruits.  CARDIOVASCULAR:  S1 and S2.  Regular rate and rhythm.  No murmur, rub or  clicks.  ABDOMEN:  Soft, nontender, nondistended.  Positive bowel sounds.  LUNGS:  Clear to auscultation bilaterally.  No rales, rhonchi or  wheezes.      Lucita Ferrara, MD  Electronically Signed     RR/MEDQ  D:  01/26/2008  T:  01/26/2008  Job:  [id number]

## 2011-05-08 NOTE — H&P (Signed)
NAMEROSALIA, Dawn Rice            ACCOUNT NO.:  [id number]   MEDICAL RECORD NO.:  [id number]          PATIENT TYPE:  EMS   LOCATION:  MAJO                         FACILITY:  [facility]   PHYSICIAN:  Thomasenia Bottoms, MDDATE OF BIRTH:  Apr 23, 1956   DATE OF ADMISSION:  01/24/2008  DATE OF DISCHARGE:                              HISTORY & PHYSICAL   CHIEF COMPLAINT:  Chest pain.   HISTORY OF PRESENT ILLNESS:  Dawn Rice is a 55 year old who  presents today after having approximately 20 hours of midsternal chest  pain radiating into her left shoulder with numbness going down her left  arm.  The pain started last night and continued all night and all  through the day.  The fact that it would not go away really worried the  patient, so she came in for evaluation.  She says the intensity of the  pain does ease and later strengthened, but she was not painfree at all  until she was given nitroglycerin here in the emergency department.  She  denies any fever, no cough.  No nausea, no shortness of breath, and she  has never had any pain like this before.   PAST MEDICAL HISTORY:  1. Hypertension.  2. Diabetes mellitus which is diet controlled.  3. Obesity.  4. Epilepsy.  5. History of hysterectomy.   The patient had a negative nuclear medicine stress test in April of  2006.   MEDICATIONS ON ADMISSION:  1. Amlodipine 10 mg p.o. daily.  2. Lisinopril/HCTZ 20/25 mg once daily.  3. She is also prescribed Tegretol which she takes b.i.d.      intermittently.  She does say she takes her up blood pressure      medications every day.   FAMILY HISTORY:  Significant for no history of coronary artery disease  but history of diabetes.   SOCIAL HISTORY:  She does smoke cigarettes but no alcohol or illicit  drug use.  She is unemployed.   REVIEW OF SYSTEMS:  CONSTITUTIONAL:  She denies any night sweats,  fevers.  HEENT:  No headache, no blurred vision.  No sore throat.  CARDIOVASCULAR:  She  had the chest pains, as mentioned above.  No lower  extremity edema.  RESPIRATORY:  No hemoptysis.  No shortness of breath.  No wheezing.  GI:  No abdominal pain.  No diarrhea or constipation.  Has  not vomited blood or seen any blood in her stool.  GU:  No hematuria.  MUSCULOSKELETAL:  She denies any joint pains.  INTEGUMENTARY:  No open  lesions or rashes.  She does, however, have a small square area  approximately 3 x 3 cm.  It looks to be a patch of eczema, but she says  it swells periodically and sometimes is numb, though it is not at this  time.  NEUROLOGIC:  She is independent and has no asymmetric weakness.  No slurred speech.  She does have the history of epilepsy.  Her last  seizure was approximately 1 month ago.  She does not like taking her  seizure medication.  MUSCULOSKELETAL:  No significant joint pains.  All  other systems reviewed and are negative.   PHYSICAL EXAMINATION:  VITAL SIGNS:  In the emergency department, her  temperature was 97.8, blood pressure 161/101, pulse 91, respiratory rate  18, O2 saturation 98% on room air.  GENERAL:  The patient is in no acute distress.  HEENT:  Normocephalic, atraumatic.  Pupils are equal and round.  Her  sclerae are nonicteric.  Oral mucosa moist.  Oropharynx within normal  limits.  NECK:  Supple.  No lymphadenopathy, no thyromegaly, no jugular venous  distention.  CARDIAC:  Regular rate and rhythm with no murmurs, gallops, or rubs.  LUNGS:  Diminished breath sounds throughout, but they are otherwise  clear to auscultation with no wheezes, rhonchi, or rales.  ABDOMEN:  Obese, nontender, nondistended.  Normoactive bowel sounds.  No  masses are appreciated.  EXTREMITIES:  No evidence of clubbing, cyanosis, or edema.  NEUROLOGIC:  She is alert and oriented x3.  She is cooperative and  appropriate.  Her cranial nerves II-XII are intact grossly.  She has 5/5  strength in her upper and lower extremities.  Her sensory exam is intact   grossly in her upper and lower extremity.  She has normal muscle tone  and bulk.  MUSCULOSKELETAL:  Excellent range of motion in all of her joints.  No  evidence of synovitis.  SKIN:  Intact with no open lesions or rashes.  She does have the square  patch of what appears to be eczema on the lateral surface of her left  shin, as mentioned above.   LABORATORY DATA:  Her white count is 6.3, hemoglobin 12.9, hematocrit  38.9, platelet count is 293.  Sodium is 140, potassium 3.5, chloride  107, BUN 10, creatinine 0.7, glucose is 141.   Chest x-ray report is not yet resulted.  Her EKG reveals normal sinus  rhythm with a rate of 86.  There is no evidence of ST segment elevation  or depression.   ASSESSMENT AND PLAN:  1. Atypical chest pain in patient with multiple cardiac risk factors,      including hypertension, diabetes, obesity, tobacco abuse.  The pain      is atypical in its extended duration with normal troponin.  We will      admit her to the hospital overnight and rule her out for myocardial      infarction.  No further testing planned at this juncture.  2. Hypertension.  Her blood pressure was certainly high on arrival      here in the emergency department.  We will continue her medications      and follow.  3. Diabetes, diet-controlled.  Check a hemoglobin A1c and put her on a      diabetic diet here in the hospital.  4. Epilepsy.  The patient is not compliant with her medications and      cannot even remember the dose of her Tegretol.  I have encouraged      her to take her medication as prescribed.  5. Obesity.  The patient is not looking forward to a time where she      may have to take medication for her diabetes, so I counseled her to      the try to lose weight.  6. Tobacco abuse.  She should also be counseled to quit smoking.      Thomasenia Bottoms, MD  Electronically Signed     CVC/MEDQ  D:  01/25/2008  T:  01/25/2008  Job:  [id number]  cc:   [facility]

## 2011-05-08 NOTE — Consult Note (Signed)
NAMEKERIANN, RANKIN            ACCOUNT NO.:  [id number]   MEDICAL RECORD NO.:  [id number]          PATIENT TYPE:  INP   LOCATION:  5123                         FACILITY:  [facility]   PHYSICIAN:  Petra Kuba, M.D.    DATE OF BIRTH:  25-Mar-1956   DATE OF CONSULTATION:  04/01/2009  DATE OF DISCHARGE:                                 CONSULTATION   We were asked to see Ms. Oguin today in consultation for elevated  LFTs and common bile duct stone, status post laparoscopic  cholecystectomy.   HISTORY OF PRESENT ILLNESS:  This is a 55 year old female admitted  through the ED on March 30, 2009, who was found to have acute  cholecystitis.  She underwent laparoscopic cholecystectomy on March 30, 2009.  Her surgery was reported to be long and difficult secondary to  body habitus as well as a very thick-walled gallbladder.  The patient  reports that she has had lower back pain for a long time.  It began to  radiate upward.  She also developed abdominal pain this past weekend,  decided to come to the emergency department on March 30, 2009.  She has  had no emesis, no diarrhea.  She is usually slightly constipated and she  has had no unexpected changes in weight.  The patient has never had a  colonoscopy.   PAST MEDICAL HISTORY:  Significant for:  1. Type 2 diabetes.  2. Hypertension.  3. Hyperlipidemia.  4. Morbid obesity.  5. Seizure disorder.  She has silent seizures.  6. She is status post hysterectomy.   CURRENT MEDICATIONS:  Glucotrol, Norvasc, Flexeril, lisinopril with  hydrochlorothiazide, tramadol, metformin, and carbamazepine.   ALLERGIES:  She has no known drug allergies.   REVIEW OF SYSTEMS:  Significant for headache and back pain.  No  shortness of breath, palpitations, or fever.   SOCIAL HISTORY:  Positive for occasional alcohol.  She is an ex-smoker.  She smoked for approximately 10 years.   FAMILY HISTORY:  Negative for gallbladder disease.  Negative for colon  cancer that she knows of.   PHYSICAL EXAMINATION:  GENERAL:  She is alert and oriented in no  apparent distress, but does appear slightly anxious.  CARDIOVASCULAR:  Sounds tachy with no obvious murmur or rub.  ABDOMEN:  Distended, tender, and quiet.   LABORATORY DATA:  Her white count has gone from 9.3-12.1 over the last 2  days.  Her hemoglobin is 10.9, hematocrit 32.1, platelets 264,000.  LFTs  are as follows.  AST 79, ALT 157, alk phos 213, total bilirubin 4.8,  potassium today is 3.2.  On radiological exam, her intraoperative  cholangiogram showed distal common bile duct stone.   ASSESSMENT:  Dr. Carman Ching has seen and examined the patient,  collected history, and reviewed her chart.  His impression is this is a  55 year old female status post laparoscopic cholecystectomy who had a  positive intraoperative cholangiogram and now has a common bile duct  stone.  Plan for endoscopic retrograde cholangiopancreatography at 1  p.m. today with Dr. Vida Rigger.   Thanks very much for this consultation.  Stephani Police, PA    ______________________________  Petra Kuba, M.D.    MLY/MEDQ  D:  04/01/2009  T:  04/02/2009  Job:  [id number]   cc:   Petra Kuba, M.D.  Anselm Pancoast. Zachery Dakins, M.D.

## 2011-05-08 NOTE — Discharge Summary (Signed)
NAMEARVIS, MIGUEZ            ACCOUNT NO.:  [id number]   MEDICAL RECORD NO.:  [id number]          PATIENT TYPE:  INP   LOCATION:  3732                         FACILITY:  [facility]   PHYSICIAN:  Lucita Ferrara, MD         DATE OF BIRTH:  1956/07/24   DATE OF ADMISSION:  01/25/2008  DATE OF DISCHARGE:  01/26/2008                               DISCHARGE SUMMARY   ADMISSION DIAGNOSES:  1. Chest pain, typical in nature.  2. Diabetes.  3. Hyperlipidemia.  4. Deep venous thrombosis and gastrointestinal prophylaxis.   DISCHARGE DIAGNOSES:  Atypical type of chest pain, status post Myoview  found to be negative.  Myoview showed no significant ischemia,  __________ of the inferior wall and apex.  Inferior wall hypokinesis,  ejection fraction of 72%, diabetes type 2, hyperlipidemia, deep venous  thrombosis.   BRIEF HISTORY OF PRESENT ILLNESS:  1. Ms. Sans is a 55 year old African-American obese female, who      came to [facility], with chest pain located in the      left-sided precordial area, radiating to left arm, associated with      shortness of breath but no diaphoresis.  Given her multiple risk      factors including diabetes and hyperlipidemia, the patient was      admitted to the hospital, and Myoview as above was negative.  2. For her diabetes, the patient was assessed for control, and      hemoglobin A1c was ordered.  Hemoglobin A1c was 7, showing that she      has uncontrolled diabetes.  The patient's medications were      adjusted.   Dictation Ended At [facility].      Lucita Ferrara, MD  Electronically Signed     RR/MEDQ  D:  01/26/2008  T:  01/26/2008  Job:  [id number]

## 2011-05-08 NOTE — Op Note (Signed)
NAMEMarland Kitchen  Dawn Rice, Dawn Rice            ACCOUNT NO.:  [id number]   MEDICAL RECORD NO.:  [id number]          PATIENT TYPE:  INP   LOCATION:  5123                         FACILITY:  [facility]   PHYSICIAN:  Petra Kuba, M.D.    DATE OF BIRTH:  03-29-1956   DATE OF PROCEDURE:  DATE OF DISCHARGE:                               OPERATIVE REPORT   SURGEON:  Petra Kuba, MD   PROCEDURES:  1. Endoscopic retrograde cholangiopancreatography.  2. Sphincterotomy.  3. Stone extraction.   INDICATIONS:  The patient with positive intraop cholangiogram worrisome  for CBD stone, increased liver test, fever, and abdominal pain, status  post cholecystectomy.  Consent was signed after risks, benefits,  methods, options thoroughly discussed by both the surgical team, my PA  Algis Downs, and myself prior to sedation.   MEDICINES USED:  Fentanyl 150 mcg, Versed 12.5 mg, glucagon 1.5 mg  during the procedure.   PROCEDURE:  A side-viewing therapeutic video duodenoscope was inserted  by indirect vision into the stomach and advanced through the pylorus  into the duodenum and normal-appearing ampulla was brought into view.  Using the triple-lumen sphincterotome, deep selective cannulation was  obtained.  There was no PD injections or wire advancements.  The CBD was  normal and probable some small stones were seen on initial  cholangiogram.  We went ahead and proceeded with a small-to-medium-sized  sphincterotomy in a customary fashion; however, with increased spasm  despite glucagon probably could not make it as big as we would have  liked.  We went ahead and exchanged the sphincterotome for the  adjustable 12- to 15-mm balloon and proceeded with multiple balloon  pullthroughs with multiple small stones being delivered on multiple  times.  The 12-mm balloon pulled through the patent sphincterotomy  source with very minimal if any resistance.  After 2 negative balloon  pullthroughs, we went ahead and proceeded  with an occlusion  cholangiogram in the customary fashion.  A few tiny air bubble seemed to  remain but no obvious significant stones.  We went ahead and proceeded  with 2 more balloon pullthroughs without any further stones being  delivered.  We elected to stop the procedure at this junction.  The  scope and balloon and wire were removed.  The patient tolerated the  procedure well.  There was no obvious immediate complication.   ENDOSCOPIC DIAGNOSES:  1. Normal ampulla.  2. No PD injections or wire advancements.  3. Positive common bile duct stones and normal-appearing, otherwise,      common bile duct status post medium sphincterotomy and multiple 12-      mm balloon pullthroughs with multiple stones being delivered.  4. Negative occlusion cholangiogram after multiple stones removed and      multiple other balloon pullthroughs.   PLAN:  Observe for delayed complications.  If signs of residual stones  still, re-ERCP p.r.n.  Happy to see back p.r.n., otherwise, return care  to the surgeons for the customary postop surgical care and slowly  advance diet tomorrow if doing well and repeat labs as well.  ______________________________  Petra Kuba, M.D.     MEM/MEDQ  D:  04/01/2009  T:  04/02/2009  Job:  [id number]   cc:   Anselm Pancoast. Zachery Dakins, M.D.  [facility]
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
## 2011-05-23 ENCOUNTER — Ambulatory Visit: Payer: Medicaid Other | Admitting: Dietician

## 2011-09-13 LAB — CBC
MCHC: 33.2
RBC: 4.59
WBC: 6.3

## 2011-09-13 LAB — I-STAT 8, (EC8 V) (CONVERTED LAB)
BUN: 10
Chloride: 107
Glucose, Bld: 141 — ABNORMAL HIGH
pCO2, Ven: 43.6 — ABNORMAL LOW
pH, Ven: 7.347 — ABNORMAL HIGH

## 2011-09-13 LAB — DIFFERENTIAL
Basophils Absolute: 0.2 — ABNORMAL HIGH
Eosinophils Relative: 6 — ABNORMAL HIGH
Lymphocytes Relative: 39
Lymphs Abs: 2.4
Monocytes Absolute: 0.3
Monocytes Relative: 4

## 2011-09-13 LAB — POCT CARDIAC MARKERS
CKMB, poc: <1 — ABNORMAL LOW
Myoglobin, poc: 35.7
Operator id: 151321
Troponin i, poc: <0.05

## 2011-09-13 LAB — POCT I-STAT CREATININE: Operator id: 151321

## 2011-09-14 LAB — CK TOTAL AND CKMB (NOT AT ARMC)
CK, MB: 1.2
CK, MB: 1.4
Relative Index: 1.4
Relative Index: INVALID
Total CK: 101
Total CK: 94

## 2011-09-14 LAB — CBC
HCT: 39.8
Hemoglobin: 12.9
Platelets: 303
RDW: 14.5
WBC: 6.3

## 2011-09-14 LAB — COMPREHENSIVE METABOLIC PANEL
ALT: 23
Albumin: 3.9
Alkaline Phosphatase: 71
BUN: 11
Chloride: 104
Glucose, Bld: 92
Potassium: 3.9
Sodium: 138
Total Bilirubin: 0.5
Total Protein: 7.1

## 2011-09-14 LAB — TSH: TSH: 1.579

## 2011-09-14 LAB — LIPID PANEL
LDL Cholesterol: 157 — ABNORMAL HIGH
Triglycerides: 128

## 2011-09-14 LAB — HEMOGLOBIN A1C: Mean Plasma Glucose: 172

## 2011-09-14 LAB — PHOSPHORUS: Phosphorus: 4.6

## 2011-09-14 LAB — TROPONIN I
Troponin I: <0.01
Troponin I: <0.01
Troponin I: <0.01

## 2011-09-30 ENCOUNTER — Emergency Department (HOSPITAL_COMMUNITY)
Admission: EM | Admit: 2011-09-30 | Discharge: 2011-09-30 | Disposition: A | Discharge status: Home / Self Care | Payer: Medicare Other | Attending: Emergency Medicine | Admitting: Emergency Medicine

## 2011-09-30 DIAGNOSIS — F329 Major depressive disorder, single episode, unspecified: Secondary | ICD-10-CM | POA: Insufficient documentation

## 2011-09-30 DIAGNOSIS — I10 Essential (primary) hypertension: Secondary | ICD-10-CM | POA: Insufficient documentation

## 2011-09-30 DIAGNOSIS — R51 Headache: Secondary | ICD-10-CM | POA: Insufficient documentation

## 2011-09-30 DIAGNOSIS — F3289 Other specified depressive episodes: Secondary | ICD-10-CM | POA: Insufficient documentation

## 2011-09-30 DIAGNOSIS — E119 Type 2 diabetes mellitus without complications: Secondary | ICD-10-CM | POA: Insufficient documentation

## 2011-09-30 DIAGNOSIS — G40909 Epilepsy, unspecified, not intractable, without status epilepticus: Secondary | ICD-10-CM | POA: Insufficient documentation

## 2011-09-30 DIAGNOSIS — Z79899 Other long term (current) drug therapy: Secondary | ICD-10-CM | POA: Insufficient documentation

## 2011-10-06 ENCOUNTER — Emergency Department (HOSPITAL_COMMUNITY)
Admission: EM | Admit: 2011-10-06 | Discharge: 2011-10-06 | Disposition: A | Discharge status: Home / Self Care | Payer: Medicare Other | Attending: Emergency Medicine | Admitting: Emergency Medicine

## 2011-10-06 DIAGNOSIS — F329 Major depressive disorder, single episode, unspecified: Secondary | ICD-10-CM | POA: Insufficient documentation

## 2011-10-06 DIAGNOSIS — M79609 Pain in unspecified limb: Secondary | ICD-10-CM | POA: Insufficient documentation

## 2011-10-06 DIAGNOSIS — G40909 Epilepsy, unspecified, not intractable, without status epilepticus: Secondary | ICD-10-CM | POA: Insufficient documentation

## 2011-10-06 DIAGNOSIS — M25519 Pain in unspecified shoulder: Secondary | ICD-10-CM | POA: Insufficient documentation

## 2011-10-06 DIAGNOSIS — E119 Type 2 diabetes mellitus without complications: Secondary | ICD-10-CM | POA: Insufficient documentation

## 2011-10-06 DIAGNOSIS — I10 Essential (primary) hypertension: Secondary | ICD-10-CM | POA: Insufficient documentation

## 2011-10-06 DIAGNOSIS — F3289 Other specified depressive episodes: Secondary | ICD-10-CM | POA: Insufficient documentation

## 2011-10-06 DIAGNOSIS — Z79899 Other long term (current) drug therapy: Secondary | ICD-10-CM | POA: Insufficient documentation

## 2011-10-06 LAB — DIFFERENTIAL
Basophils Absolute: 0 10*3/uL (ref 0.0–0.1)
Lymphocytes Relative: 41 % (ref 12–46)
Lymphs Abs: 3.1 10*3/uL (ref 0.7–4.0)
Neutro Abs: 3.4 10*3/uL (ref 1.7–7.7)

## 2011-10-06 LAB — CBC
HCT: 33 % — ABNORMAL LOW (ref 36.0–46.0)
Hemoglobin: 11 g/dL — ABNORMAL LOW (ref 12.0–15.0)
MCV: 81.9 fL (ref 78.0–100.0)
RBC: 4.03 MIL/uL (ref 3.87–5.11)
RDW: 14.8 % (ref 11.5–15.5)
WBC: 7.4 10*3/uL (ref 4.0–10.5)

## 2011-10-06 LAB — BASIC METABOLIC PANEL
CO2: 27 mEq/L (ref 19–32)
Calcium: 10.1 mg/dL (ref 8.4–10.5)
Chloride: 98 mEq/L (ref 96–112)
Glucose, Bld: 130 mg/dL — ABNORMAL HIGH (ref 70–99)
Potassium: 3.5 mEq/L (ref 3.5–5.1)
Sodium: 137 mEq/L (ref 135–145)

## 2011-10-06 LAB — POCT I-STAT TROPONIN I

## 2011-10-23 ENCOUNTER — Emergency Department (HOSPITAL_COMMUNITY): Payer: Medicare Other

## 2011-10-23 ENCOUNTER — Encounter (HOSPITAL_COMMUNITY): Payer: Self-pay | Admitting: Radiology

## 2011-10-23 ENCOUNTER — Emergency Department (HOSPITAL_COMMUNITY)
Admission: EM | Admit: 2011-10-23 | Discharge: 2011-10-23 | Disposition: A | Discharge status: Home / Self Care | Payer: Medicare Other | Attending: Emergency Medicine | Admitting: Emergency Medicine

## 2011-10-23 DIAGNOSIS — Y92009 Unspecified place in unspecified non-institutional (private) residence as the place of occurrence of the external cause: Secondary | ICD-10-CM | POA: Insufficient documentation

## 2011-10-23 DIAGNOSIS — F3289 Other specified depressive episodes: Secondary | ICD-10-CM | POA: Insufficient documentation

## 2011-10-23 DIAGNOSIS — Z79899 Other long term (current) drug therapy: Secondary | ICD-10-CM | POA: Insufficient documentation

## 2011-10-23 DIAGNOSIS — F329 Major depressive disorder, single episode, unspecified: Secondary | ICD-10-CM | POA: Insufficient documentation

## 2011-10-23 DIAGNOSIS — E119 Type 2 diabetes mellitus without complications: Secondary | ICD-10-CM | POA: Insufficient documentation

## 2011-10-23 DIAGNOSIS — S0990XA Unspecified injury of head, initial encounter: Secondary | ICD-10-CM | POA: Insufficient documentation

## 2011-10-23 DIAGNOSIS — R51 Headache: Secondary | ICD-10-CM | POA: Insufficient documentation

## 2011-10-23 DIAGNOSIS — I10 Essential (primary) hypertension: Secondary | ICD-10-CM | POA: Insufficient documentation

## 2011-10-23 DIAGNOSIS — G40909 Epilepsy, unspecified, not intractable, without status epilepticus: Secondary | ICD-10-CM | POA: Insufficient documentation

## 2011-10-23 DIAGNOSIS — W108XXA Fall (on) (from) other stairs and steps, initial encounter: Secondary | ICD-10-CM | POA: Insufficient documentation

## 2011-10-23 HISTORY — DX: Epilepsy, unspecified, not intractable, without status epilepticus: G40.909

## 2011-10-23 HISTORY — DX: Essential (primary) hypertension: I10

## 2012-03-07 ENCOUNTER — Other Ambulatory Visit: Payer: Self-pay | Admitting: Family Medicine

## 2012-03-07 ENCOUNTER — Ambulatory Visit
Admission: RE | Admit: 2012-03-07 | Discharge: 2012-03-07 | Disposition: A | Discharge status: Home / Self Care | Payer: Medicare Other | Source: Ambulatory Visit | Attending: Family Medicine | Admitting: Family Medicine

## 2012-03-07 DIAGNOSIS — R609 Edema, unspecified: Secondary | ICD-10-CM

## 2012-03-07 DIAGNOSIS — R52 Pain, unspecified: Secondary | ICD-10-CM

## 2012-08-20 ENCOUNTER — Emergency Department (HOSPITAL_COMMUNITY): Payer: No Typology Code available for payment source

## 2012-08-20 ENCOUNTER — Emergency Department (HOSPITAL_COMMUNITY)
Admission: EM | Admit: 2012-08-20 | Discharge: 2012-08-20 | Disposition: A | Discharge status: Home / Self Care | Payer: No Typology Code available for payment source | Attending: Emergency Medicine | Admitting: Emergency Medicine

## 2012-08-20 ENCOUNTER — Encounter (HOSPITAL_COMMUNITY): Payer: Self-pay | Admitting: Emergency Medicine

## 2012-08-20 DIAGNOSIS — Z79899 Other long term (current) drug therapy: Secondary | ICD-10-CM | POA: Insufficient documentation

## 2012-08-20 DIAGNOSIS — M545 Low back pain, unspecified: Secondary | ICD-10-CM | POA: Insufficient documentation

## 2012-08-20 DIAGNOSIS — E119 Type 2 diabetes mellitus without complications: Secondary | ICD-10-CM | POA: Insufficient documentation

## 2012-08-20 DIAGNOSIS — I10 Essential (primary) hypertension: Secondary | ICD-10-CM | POA: Insufficient documentation

## 2012-08-20 DIAGNOSIS — M25569 Pain in unspecified knee: Secondary | ICD-10-CM | POA: Insufficient documentation

## 2012-08-20 DIAGNOSIS — Z9089 Acquired absence of other organs: Secondary | ICD-10-CM | POA: Insufficient documentation

## 2012-08-20 DIAGNOSIS — G40909 Epilepsy, unspecified, not intractable, without status epilepticus: Secondary | ICD-10-CM | POA: Insufficient documentation

## 2012-08-20 DIAGNOSIS — Y92410 Unspecified street and highway as the place of occurrence of the external cause: Secondary | ICD-10-CM | POA: Insufficient documentation

## 2012-08-20 MED ORDER — ONDANSETRON 4 MG PO TBDP
4.0000 mg | ORAL_TABLET | Freq: Once | ORAL | Status: AC
  Administered 2012-08-20: 4 mg via ORAL
  Filled 2012-08-20: qty 1

## 2012-08-20 MED ORDER — DIAZEPAM 5 MG PO TABS
5.0000 mg | ORAL_TABLET | Freq: Once | ORAL | Status: AC
  Administered 2012-08-20: 5 mg via ORAL
  Filled 2012-08-20: qty 1

## 2012-08-20 MED ORDER — CYCLOBENZAPRINE HCL 10 MG PO TABS: 10.0000 mg | ORAL_TABLET | Freq: Two times a day (BID) | ORAL | Status: AC | PRN

## 2012-08-20 MED ORDER — OXYCODONE-ACETAMINOPHEN 5-325 MG PO TABS: 1.0000 | ORAL_TABLET | Freq: Four times a day (QID) | ORAL | Status: AC | PRN

## 2012-08-20 MED ORDER — MORPHINE SULFATE 4 MG/ML IJ SOLN
4.0000 mg | Freq: Once | INTRAMUSCULAR | Status: AC
  Administered 2012-08-20: 4 mg via INTRAMUSCULAR
  Filled 2012-08-20: qty 1

## 2012-08-20 MED ORDER — OXYCODONE-ACETAMINOPHEN 5-325 MG PO TABS: 1.0000 | ORAL_TABLET | Freq: Once | ORAL | Status: DC

## 2012-08-20 MED ORDER — DIAZEPAM 5 MG PO TABS
ORAL_TABLET | ORAL | Status: AC
  Filled 2012-08-20: qty 1

## 2012-08-20 NOTE — ED Notes (Signed)
MD at bedside. 

## 2012-08-20 NOTE — ED Notes (Signed)
Pt back to room from being out of the department; family at bedside
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
## 2012-08-20 NOTE — ED Notes (Signed)
Informed [place], MD of pt's complaints of her left shoulder area is now hurting her

## 2012-08-20 NOTE — ED Notes (Signed)
Patient transported to X-ray 

## 2012-08-20 NOTE — ED Notes (Signed)
C-collar removed by PA upon exam

## 2012-08-20 NOTE — ED Provider Notes (Signed)
Medical screening examination/treatment/procedure(s) were performed by non-physician practitioner and as supervising physician I was immediately available for consultation/collaboration.   Loren Racer, MD 08/20/12 1407
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
## 2012-08-20 NOTE — ED Notes (Signed)
Maintaining C-spine immobilization pt was taken off the backboard.  Complains lower back tenderness.

## 2012-12-01 ENCOUNTER — Ambulatory Visit: Payer: Medicare Other | Admitting: *Deleted

## 2013-02-12 ENCOUNTER — Encounter: Payer: Medicare Other | Attending: Internal Medicine | Admitting: Dietician

## 2013-02-12 ENCOUNTER — Encounter: Payer: Self-pay | Admitting: Dietician

## 2013-02-12 VITALS — Ht 62.0 in | Wt 221.1 lb

## 2013-02-12 DIAGNOSIS — I10 Essential (primary) hypertension: Secondary | ICD-10-CM | POA: Insufficient documentation

## 2013-02-12 DIAGNOSIS — Z713 Dietary counseling and surveillance: Secondary | ICD-10-CM | POA: Insufficient documentation

## 2013-02-12 DIAGNOSIS — E669 Obesity, unspecified: Secondary | ICD-10-CM | POA: Insufficient documentation

## 2013-02-12 DIAGNOSIS — E119 Type 2 diabetes mellitus without complications: Secondary | ICD-10-CM

## 2013-02-12 NOTE — Progress Notes (Signed)
Medical Nutrition Therapy:  Appt start time: 1115 end time:  1215.   Assessment:  Primary concerns today: Wants a diet to help with weight loss.  History of DM for 10 years, and no previous diabetes education. Has lost about 4 lbs since her MD visit.  Frustrated with weight and its impact on her blood glucose.  Has been decreasing her food intake.  Has noted a decrease in appetite, but is not able to effectively lose weight.  HYPERGLYCEMIA:  Gives S/S of increased thirst and urinary frequency.  HYPOGLYCEMIA: Notes some dizziness at times.  FOOT SELF-EXAM:  Examines feet daily.  Review of the use of lotion and what to specifically look for.  C/O issues with fungus under her toenails.  She is looking for a "a safe approach to treat the fungus.  DILATED EYE EXAM:  No recent dilated eye exam.  BLOOD GLUCOSE MONITORING: Monitoring fasting levels :  Range= 150-161-148-124 mg  And PC dinner: range in the 140's  MEDICATIONS: Completed med review.  Janumet 500 mg with evening meal for glucose control.   DIETARY INTAKE:  Usual eating pattern includes 1 meals and 1-2 snacks per day.  Everyday foods include fruit, starch, protein, vegetables.  Avoided foods include regular soda.    24-hr recall:  B ( AM): 9:00 apple or orange and take medications.  water  Starting the Boost for Diabetics 1 bottle Snk ( AM): water  L ( PM): sometimes, often skips meals. Snk ( PM): sip on water D ( PM): 5:00-7:00 salad mixed greens with cheese 1.5 cup and spare ribs (2 )  Snk ( PM): 1/2 apple Beverages: 6-7 bottles of water per day  Usual physical activity: none. Walk on going places.  Walking around in stores. No regular exercise regimen.      Estimated energy needs:  HT: 62 in  WT: 221.6 lb  BMI: 40.5  Adj WT: 145-150 (69 kg) 1300-1400 calories 150-155 g carbohydrates 100-105 g protein 36-38 g fat  Progress Towards Goal(s):  No progress.   Nutritional Diagnosis:  [place]-2.1 Inpaired nutrition utilization  As related to glucose.  As evidenced by history of type 2 diabetes for 10 years with A1C at 7.0%, and continuining to gain weight, having tingling and burning in anlkes and feet which comes and goes..    Intervention:  Nutrition Review of DM and the implications of diet,exercise, weight loss on glucose control.  Pumice stone to remove the dead, dry skin from your feet.  Dark toenails, go to the [facility] web site for treatment of toenail fungus.  Read your food labels  Try to have fiber in your foods.  Aim for 2 gm fiber per slice of bread, and 3+ gm fiber in cereals and grains.  Try to keep the sugar in each serving to (0-9 gm) per serving for the majority of the day.  Try to use a lean protein at all meals and snacks.  Serving size at meals= palm of hand and about 1 oz of protein for snacks.  Try to bake, broil, roast, grill, stew, steam, proteins and other foods.  Avoid frying.  For fats, aim for 1-2 servings of added fat at each meal and 1 for snacks.  Use the food label for serving sizes.  Try to keep your carb intake to 30-45 gm at each meal and 15 gm for snacks.  Use the yellow card as a guide.  Remember to always try to have a free vegetable (non-starchy) when  possible.  Get measuring cups and practice measuring your starchy foods especially.  Aim to become more physically active.  Walking is a good place to start.  Exercise/Activity decreases blood glucose levels.   Try to monitor at other times during the day.  Try doing a post-meal glucose.  Check 2 hours after the first bite of a meal or large snack.  Goal is to be 80-160 mg at 2 hours.  Add the more complex carbs (those with fiber and protein) into your diet.  These will help with giving your energy, and include the whole grains, the beans, peas, lentils, non-starchy veggies, the fruits.  You will just monitor your serving size.  Plan to F/U in 8 weeks.  Handouts given during visit include:  Living Well with  Diabetes  Yellow Card with diet prescription  Carb Counting Guide.  Monitoring/Evaluation:  Dietary intake, exercise, blood glucose levels, and body weight in 8-12 weeks.Marland Kitchen

## 2013-02-12 NOTE — Patient Instructions (Addendum)
   Pumice stone to remove the dead, dry skin from your feet.  Dark toenails, go to the [facility] web site for treatment of toenail fungus.  Read your food labels  Try to have fiber in your foods.  Aim for 2 gm fiber per slice of bread, and 3+ gm fiber in cereals and grains.  Try to keep the sugar in each serving to (0-9 gm) per serving for the majority of the day.  Try to use a lean protein at all meals and snacks.  Serving size at meals= palm of hand and about 1 oz of protein for snacks.  Try to bake, broil, roast, grill, stew, steam, proteins and other foods.  Avoid frying.  For fats, aim for 1-2 servings of added fat at each meal and 1 for snacks.  Use the food label for serving sizes.  Try to keep your carb intake to 30-45 gm at each meal and 15 gm for snacks.  Use the yellow card as a guide.  Remember to always try to have a free vegetable (non-starchy) when possible.  Get measuring cups and practice measuring your starchy foods especially.  Aim to become more physically active.  Walking is a good place to start.  Exercise/Activity decreases blood glucose levels.   Try to monitor at other times during the day.  Try doing a post-meal glucose.  Check 2 hours after the first bite of a meal or large snack.  Goal is to be 80-160 mg at 2 hours.  Add the more complex carbs (those with fiber and protein) into your diet.  These will help with giving your energy, and include the whole grains, the beans, peas, lentils, non-starchy veggies, the fruits.  You will just monitor your serving size.  Plan to F/U in 8 weeks.

## 2013-02-16 ENCOUNTER — Encounter: Payer: Self-pay | Admitting: Dietician

## 2013-04-08 ENCOUNTER — Ambulatory Visit: Payer: Medicare Other | Admitting: Dietician

## 2013-04-13 ENCOUNTER — Encounter: Payer: Self-pay | Admitting: *Deleted

## 2013-04-13 ENCOUNTER — Encounter (HOSPITAL_BASED_OUTPATIENT_CLINIC_OR_DEPARTMENT_OTHER): Payer: Self-pay | Admitting: *Deleted

## 2013-08-25 ENCOUNTER — Encounter (HOSPITAL_COMMUNITY): Payer: Self-pay | Admitting: Emergency Medicine

## 2013-08-25 ENCOUNTER — Emergency Department (HOSPITAL_COMMUNITY)
Admission: EM | Admit: 2013-08-25 | Discharge: 2013-08-25 | Disposition: A | Discharge status: Home / Self Care | Payer: Medicare Other | Attending: Emergency Medicine | Admitting: Emergency Medicine

## 2013-08-25 DIAGNOSIS — E785 Hyperlipidemia, unspecified: Secondary | ICD-10-CM | POA: Insufficient documentation

## 2013-08-25 DIAGNOSIS — M6530 Trigger finger, unspecified finger: Secondary | ICD-10-CM | POA: Insufficient documentation

## 2013-08-25 DIAGNOSIS — I10 Essential (primary) hypertension: Secondary | ICD-10-CM | POA: Insufficient documentation

## 2013-08-25 DIAGNOSIS — Z87891 Personal history of nicotine dependence: Secondary | ICD-10-CM | POA: Insufficient documentation

## 2013-08-25 DIAGNOSIS — G40909 Epilepsy, unspecified, not intractable, without status epilepticus: Secondary | ICD-10-CM | POA: Insufficient documentation

## 2013-08-25 DIAGNOSIS — E119 Type 2 diabetes mellitus without complications: Secondary | ICD-10-CM | POA: Insufficient documentation

## 2013-08-25 DIAGNOSIS — Z90710 Acquired absence of both cervix and uterus: Secondary | ICD-10-CM | POA: Insufficient documentation

## 2013-08-25 DIAGNOSIS — Z9889 Other specified postprocedural states: Secondary | ICD-10-CM | POA: Insufficient documentation

## 2013-08-25 DIAGNOSIS — R209 Unspecified disturbances of skin sensation: Secondary | ICD-10-CM | POA: Insufficient documentation

## 2013-08-25 DIAGNOSIS — Z79899 Other long term (current) drug therapy: Secondary | ICD-10-CM | POA: Insufficient documentation

## 2013-08-25 MED ORDER — MELOXICAM 15 MG PO TABS: 15.0000 mg | ORAL_TABLET | Freq: Every day | ORAL | Status: DC

## 2013-08-25 MED ORDER — OXYCODONE-ACETAMINOPHEN 5-325 MG PO TABS: 1.0000 | ORAL_TABLET | ORAL | Status: DC | PRN

## 2013-08-25 NOTE — ED Notes (Signed)
Pt here with c/i left hand pain , no injury

## 2013-08-25 NOTE — ED Notes (Signed)
Pt here with c/o left hand pain that had no injury related to the pain , pt able to make a fist and wiggle all of her fingers

## 2013-08-25 NOTE — ED Provider Notes (Signed)
CSN: [id number]     Arrival date & time 08/25/13  1625 History  This chart was scribed for non-physician practitioner, Arthor Captain, PA-C, working with Toy Baker, MD by Clydene Laming, ED Scribe. This patient was seen in room TR07C/TR07C and the patient's care was started at 5:04PM.   Chief Complaint  Patient presents with  . Hand Pain    The history is provided by the patient. No language interpreter was used.   HPI Comments: Dawn Rice is a 57 y.o. female who presents to the Emergency Department complaining of gradually worsening, constant, moderate, non-radiating left hand pain described as soreness over the past month. She denies any injury to the hand. Pt reports there is "a lot" of pain which began early last month, and that the pain occasionally wakes her from sleep. She also reports some intermittent numbness in the fingers of her left hand, but states she has a history of diabetic neuropathy. Pt states that she has difficulty lifting and grasping objects, secondary to pain, but states she can hold very lightweight items. Pt denies having a history if similar hand pain. Pt denies neck problems.    Past Medical History  Diagnosis Date  . Hypertension   . Diabetes mellitus   . Epilepsy   . S/P cholecystectomy   . H/O: hysterectomy   . Hyperlipidemia    Past Surgical History  Procedure Laterality Date  . Cholecystectomy     Family History  Problem Relation Age of Onset  . Diabetes Mother   . Hypertension Mother   . Diabetes Father    History  Substance Use Topics  . Smoking status: Former Smoker    Types: Cigarettes  . Smokeless tobacco: Never Used  . Alcohol Use: Yes   OB History   Grav Para Term Preterm Abortions TAB SAB Ect Mult Living                 Review of Systems A complete 10 system review of systems was obtained and all systems are negative except as noted in the HPI and PMH.   Allergies  Review of patient's allergies indicates no known  allergies.  Home Medications   Current Outpatient Rx  Name  Route  Sig  Dispense  Refill  . albuterol (PROVENTIL HFA;VENTOLIN HFA) 108 (90 BASE) MCG/ACT inhaler   Inhalation   Inhale 2 puffs into the lungs every 4 (four) hours as needed. For shortness of breath         . atorvastatin (LIPITOR) 20 MG tablet   Oral   Take 20 mg by mouth daily.         . carbamazepine (CARBATROL) 200 MG 12 hr capsule   Oral   Take 200 mg by mouth 2 (two) times daily.         . cyclobenzaprine (FLEXERIL) 10 MG tablet   Oral   Take 10 mg by mouth 3 (three) times daily as needed for muscle spasms.         Marland Kitchen ibuprofen (ADVIL,MOTRIN) 200 MG tablet   Oral   Take 400 mg by mouth every 6 (six) hours as needed. For pain         . indapamide (LOZOL) 1.25 MG tablet   Oral   Take 1.25 mg by mouth daily.         . Liraglutide (VICTOZA) 18 MG/3ML SOLN   Subcutaneous   Inject 1.2 mg into the skin daily.         Marland Kitchen  metoprolol tartrate (LOPRESSOR) 25 MG tablet   Oral   Take 25 mg by mouth 2 (two) times daily.         . sitaGLIPtan-metformin (JANUMET) 50-1000 MG per tablet   Oral   Take 1 tablet by mouth 2 (two) times daily with a meal.         . valsartan-hydrochlorothiazide (DIOVAN-HCT) 320-25 MG per tablet   Oral   Take 1 tablet by mouth daily.          Triage Vitals: BP 125/68  Pulse 93  Temp(Src) 98.6 F (37 C) (Oral)  Resp 18  SpO2 96%  Physical Exam  Nursing note and vitals reviewed. Constitutional: She is oriented to person, place, and time. She appears well-developed and well-nourished. No distress.  HENT:  Head: Normocephalic and atraumatic.  Eyes: EOM are normal.  Neck: Neck supple. No tracheal deviation present.  Cardiovascular: Normal rate and intact distal pulses.   Cap refill less than 3 seconds.  Pulmonary/Chest: Effort normal. No respiratory distress.  Musculoskeletal: Normal range of motion.  Tender to palpation along the left flexor tendon of the third  and fourth digits of the left hand.  This is especially painful in the left fourth digit.  Patient has locking of the fourth digit with full flexion and asked to manually unlock the digit to extension.  Neurological: She is alert and oriented to person, place, and time.  Skin: Skin is warm and dry.  Psychiatric: She has a normal mood and affect. Her behavior is normal.    ED Course  Procedures (including critical care time) DIAGNOSTIC STUDIES: Oxygen Saturation is 96% on RA, normal by my interpretation.    COORDINATION OF CARE: 6:12 PM-Discussed treatment plan which includes prescriptions for Mobic and Percocet, along with plan to receive a splint for the hand and pt agrees.  Labs Review Labs Reviewed - No data to display Imaging Review No results found.  MDM   1. Trigger finger    Patient with trigger finger of the left fourth digit.  She likely has associated tendinitis secondary to an inflammatory process.  The patient will be begun on anti-inflammatories.  She has been placed in a splint to keep her from fully flexing the fingers, ice several times a day and followup with a hand specialist. The patient appears reasonably screened and/or stabilized for discharge and I doubt any other medical condition or other [facility] requiring further screening, evaluation, or treatment in the ED at this time prior to discharge.  I personally performed the services described in this documentation, which was scribed in my presence. The recorded information has been reviewed and is accurate.      Arthor Captain, PA-C 08/27/13 [phone]

## 2013-08-28 NOTE — ED Provider Notes (Signed)
Medical screening examination/treatment/procedure(s) were performed by non-physician practitioner and as supervising physician I was immediately available for consultation/collaboration.  Liya Strollo T Chloris Marcoux, MD 08/28/13 0801 

## 2013-09-24 ENCOUNTER — Emergency Department (HOSPITAL_COMMUNITY)
Admission: EM | Admit: 2013-09-24 | Discharge: 2013-09-24 | Disposition: A | Discharge status: Home / Self Care | Payer: Medicare Other | Attending: Emergency Medicine | Admitting: Emergency Medicine

## 2013-09-24 ENCOUNTER — Emergency Department (HOSPITAL_COMMUNITY): Payer: Medicare Other

## 2013-09-24 ENCOUNTER — Encounter (HOSPITAL_COMMUNITY): Payer: Self-pay | Admitting: Adult Health

## 2013-09-24 DIAGNOSIS — I10 Essential (primary) hypertension: Secondary | ICD-10-CM | POA: Insufficient documentation

## 2013-09-24 DIAGNOSIS — Z862 Personal history of diseases of the blood and blood-forming organs and certain disorders involving the immune mechanism: Secondary | ICD-10-CM | POA: Insufficient documentation

## 2013-09-24 DIAGNOSIS — G40909 Epilepsy, unspecified, not intractable, without status epilepticus: Secondary | ICD-10-CM | POA: Insufficient documentation

## 2013-09-24 DIAGNOSIS — Z87891 Personal history of nicotine dependence: Secondary | ICD-10-CM | POA: Insufficient documentation

## 2013-09-24 DIAGNOSIS — Z8639 Personal history of other endocrine, nutritional and metabolic disease: Secondary | ICD-10-CM | POA: Insufficient documentation

## 2013-09-24 DIAGNOSIS — Z79899 Other long term (current) drug therapy: Secondary | ICD-10-CM | POA: Insufficient documentation

## 2013-09-24 DIAGNOSIS — R0789 Other chest pain: Secondary | ICD-10-CM | POA: Insufficient documentation

## 2013-09-24 DIAGNOSIS — E119 Type 2 diabetes mellitus without complications: Secondary | ICD-10-CM | POA: Insufficient documentation

## 2013-09-24 LAB — BASIC METABOLIC PANEL
CO2: 24 mEq/L (ref 19–32)
Calcium: 9.3 mg/dL (ref 8.4–10.5)
Glucose, Bld: 111 mg/dL — ABNORMAL HIGH (ref 70–99)
Potassium: 3.8 mEq/L (ref 3.5–5.1)
Sodium: 140 mEq/L (ref 135–145)

## 2013-09-24 LAB — CBC
HCT: 33.1 % — ABNORMAL LOW (ref 36.0–46.0)
Hemoglobin: 11 g/dL — ABNORMAL LOW (ref 12.0–15.0)
MCH: 26.1 pg (ref 26.0–34.0)
RBC: 4.22 MIL/uL (ref 3.87–5.11)

## 2013-09-24 LAB — POCT I-STAT TROPONIN I: Troponin i, poc: 0 ng/mL (ref 0.00–0.08)

## 2013-09-24 MED ORDER — ASPIRIN 81 MG PO CHEW
324.0000 mg | CHEWABLE_TABLET | Freq: Once | ORAL | Status: AC
  Administered 2013-09-24: 324 mg via ORAL
  Filled 2013-09-24: qty 4

## 2013-09-24 MED ORDER — GI COCKTAIL ~~LOC~~
30.0000 mL | Freq: Once | ORAL | Status: AC
  Administered 2013-09-24: 30 mL via ORAL
  Filled 2013-09-24: qty 30

## 2013-09-24 MED ORDER — NITROGLYCERIN 0.4 MG SL SUBL: 0.4000 mg | SUBLINGUAL_TABLET | SUBLINGUAL | Status: DC | PRN

## 2013-09-24 NOTE — ED Notes (Signed)
Presents with sternal chest pressure described as pressure and sharp. Pain does not radiate, began while walking this afternoon, pain is intermittent assocaited with SOB, diaphoresis. Nothing makes pain better, nothing makes pain worse. Denies nausea. Pain rated 10/10

## 2013-09-24 NOTE — ED Provider Notes (Signed)
CSN: [id number]     Arrival date & time 09/24/13  1937 History   First MD Initiated Contact with Patient 09/24/13 1952     Chief Complaint  Patient presents with  . Chest Pain   (Consider location/radiation/quality/duration/timing/severity/associated sxs/prior Treatment) HPI This is a 57 year old female with history of hypertension, diabetes, and hyperlipidemia who presents with chest pain.  The patient states that the pain started this morning. It radiated upwards. It is sharp and sternal. She states that it comes and goes. There is no association with food. Patient denies any shortness of breath but does state that she had some sweating this afternoon. She denies any cough or fever. She's currently chest pain-free. She has had symptoms like this in the past. She denies any radiation of the pain.  Patient has no family history of early heart disease. She has no personal history of heart disease.  Patient denies any association with exertion.    Her story to me is somewhat different than her story to the nurse in triage but I questioned the patient several times regarding details of her history.Review of triage note reveals the patient was complaining of 10 out of 10 pain in triage. She denies that now. She also denies shortness of breath or onset of symptoms with exertion.  Past Medical History  Diagnosis Date  . Hypertension   . Diabetes mellitus   . Epilepsy   . S/P cholecystectomy   . H/O: hysterectomy   . Hyperlipidemia    Past Surgical History  Procedure Laterality Date  . Cholecystectomy     Family History  Problem Relation Age of Onset  . Diabetes Mother   . Hypertension Mother   . Diabetes Father    History  Substance Use Topics  . Smoking status: Former Smoker    Types: Cigarettes  . Smokeless tobacco: Never Used  . Alcohol Use: Yes   OB History   Grav Para Term Preterm Abortions TAB SAB Ect Mult Living                 Review of Systems  Constitutional: Negative  for fever.  Respiratory: Negative for cough, chest tightness and shortness of breath.   Cardiovascular: Positive for chest pain.  Gastrointestinal: Negative for nausea, vomiting and abdominal pain.  Genitourinary: Negative for dysuria.  Musculoskeletal: Negative for back pain.  Skin: Negative for rash.  Neurological: Negative for headaches.  Psychiatric/Behavioral: Negative for confusion.  All other systems reviewed and are negative.    Allergies  Review of patient's allergies indicates no known allergies.  Home Medications   Current Outpatient Rx  Name  Route  Sig  Dispense  Refill  . albuterol (PROVENTIL HFA;VENTOLIN HFA) 108 (90 BASE) MCG/ACT inhaler   Inhalation   Inhale 2 puffs into the lungs 2 (two) times daily as needed for wheezing or shortness of breath. For shortness of breath         . indapamide (LOZOL) 1.25 MG tablet   Oral   Take 1.25 mg by mouth daily.         Marland Kitchen levocetirizine (XYZAL) 5 MG tablet   Oral   Take 5 mg by mouth at bedtime.         . metoprolol tartrate (LOPRESSOR) 25 MG tablet   Oral   Take 25 mg by mouth 2 (two) times daily.         Marland Kitchen oxyCODONE-acetaminophen (PERCOCET) 5-325 MG per tablet   Oral   Take 1-2 tablets by mouth  every 4 (four) hours as needed for pain.   20 tablet   0   . pregabalin (LYRICA) 50 MG capsule   Oral   Take 50 mg by mouth 3 (three) times daily.         . SitaGLIPtin-MetFORMIN HCl (JANUMET XR) [phone] MG TB24   Oral   Take 1 tablet by mouth daily with supper.         . valsartan-hydrochlorothiazide (DIOVAN-HCT) 320-25 MG per tablet   Oral   Take 1 tablet by mouth daily.         Marland Kitchen CARBAMAZEPINE PO   Oral   Take 500 mg by mouth daily.          BP 137/90  Pulse 71  Temp(Src) 98.5 F (36.9 C) (Oral)  Resp 15  SpO2 97% Physical Exam  Nursing note and vitals reviewed. Constitutional: She is oriented to person, place, and time. She appears well-developed and well-nourished. No distress.   HENT:  Head: Normocephalic and atraumatic.  Neck: Neck supple.  Cardiovascular: Normal rate, regular rhythm and normal heart sounds.   No murmur heard. Pulmonary/Chest: Effort normal and breath sounds normal. No respiratory distress. She has no wheezes.  Abdominal: Soft. Bowel sounds are normal. She exhibits no distension. There is no tenderness.  Musculoskeletal: She exhibits no edema.  Neurological: She is alert and oriented to person, place, and time.  Skin: Skin is warm and dry.  Psychiatric: She has a normal mood and affect.    ED Course  Procedures (including critical care time) Labs Review Labs Reviewed  CBC - Abnormal; Notable for the following:    Hemoglobin 11.0 (*)    HCT 33.1 (*)    RDW 16.4 (*)    All other components within normal limits  BASIC METABOLIC PANEL - Abnormal; Notable for the following:    Glucose, Bld 111 (*)    All other components within normal limits  POCT I-STAT TROPONIN I    Medications  nitroGLYCERIN (NITROSTAT) SL tablet 0.4 mg (not administered)  aspirin chewable tablet 324 mg (324 mg Oral Given 09/24/13 2007)  gi cocktail (Maalox,Lidocaine,Donnatal) (30 mLs Oral Given 09/24/13 2026)    Imaging Review Dg Chest Portable 1 View  09/24/2013   CLINICAL DATA:  Chest pain. Diabetes and hypertension.  EXAM: PORTABLE CHEST - 1 VIEW  COMPARISON:  03/1911  FINDINGS: The heart size and mediastinal contours are within normal limits. Both lungs are clear. The visualized skeletal structures are unremarkable.  IMPRESSION: No active disease.   Electronically Signed   By: Myles Rosenthal M.D.   On: 09/24/2013 20:24   EKG independently reviewed by myself: Normal sinus rhythm with a rate of 90, no evidence of acute ST elevation, T-wave inversions noted in V3 which are unchanged from prior T-wave flattening in the anterior leads this is also unchanged.  MDM   1. Chest pain, atypical     This a 82 are old female who presents with chest pain. On my evaluation,  the patient was chest pain-free.  Patient had onset of symptoms greater than 6 hours prior to arrival. Initial vital signs are notable for a blood pressure of 166/107. Chest pain is described to me is somewhat atypical. It is sharp and radiates upwards. EKG is unchanged from prior. Chest x-ray is unremarkable. Initial troponin is negative which is greater than 6 hours from onset of symptoms. Patient has a TIMI risk score of 1 for greater than 3 cardiac risk factors.  Patient was  given a full dose aspirin as well as a GI cocktail. Patient's story is somewhat atypical for ACS. She has been asymptomatic while in the emergency department. I reviewed the nursing notes that detailed a different story from triage.  At this time I cannot fully rule out ischemic disease but the patient has been asymptomatic for me and provided a different history. She's greater than 6 hours out from onset of symptoms and her troponin is negative.  Patient was given cardiology followup. She is to call tomorrow to set up for stress testing. She has any return of symptoms including chest pain, shortness of breath, diaphoresis, or any other symptoms she is to return.  After history, exam, and medical workup I feel the patient has been appropriately medically screened and is safe for discharge home. Pertinent diagnoses were discussed with the patient. Patient was given return precautions.  Shon Baton, MD 09/25/13 [phone]

## 2014-04-16 ENCOUNTER — Emergency Department (HOSPITAL_COMMUNITY)
Admission: EM | Admit: 2014-04-16 | Discharge: 2014-04-17 | Disposition: A | Discharge status: Home / Self Care | Payer: Medicare Other | Attending: Emergency Medicine | Admitting: Emergency Medicine

## 2014-04-16 ENCOUNTER — Encounter (HOSPITAL_COMMUNITY): Payer: Self-pay | Admitting: Emergency Medicine

## 2014-04-16 DIAGNOSIS — M79602 Pain in left arm: Secondary | ICD-10-CM

## 2014-04-16 DIAGNOSIS — Z880 Allergy status to penicillin: Secondary | ICD-10-CM | POA: Insufficient documentation

## 2014-04-16 DIAGNOSIS — Z87891 Personal history of nicotine dependence: Secondary | ICD-10-CM | POA: Insufficient documentation

## 2014-04-16 DIAGNOSIS — Z79899 Other long term (current) drug therapy: Secondary | ICD-10-CM | POA: Insufficient documentation

## 2014-04-16 DIAGNOSIS — I10 Essential (primary) hypertension: Secondary | ICD-10-CM | POA: Insufficient documentation

## 2014-04-16 DIAGNOSIS — Z9089 Acquired absence of other organs: Secondary | ICD-10-CM | POA: Insufficient documentation

## 2014-04-16 DIAGNOSIS — Z90710 Acquired absence of both cervix and uterus: Secondary | ICD-10-CM | POA: Insufficient documentation

## 2014-04-16 DIAGNOSIS — G40909 Epilepsy, unspecified, not intractable, without status epilepticus: Secondary | ICD-10-CM | POA: Insufficient documentation

## 2014-04-16 DIAGNOSIS — M79609 Pain in unspecified limb: Secondary | ICD-10-CM | POA: Insufficient documentation

## 2014-04-16 DIAGNOSIS — E119 Type 2 diabetes mellitus without complications: Secondary | ICD-10-CM | POA: Insufficient documentation

## 2014-04-16 LAB — URINE MICROSCOPIC-ADD ON

## 2014-04-16 LAB — I-STAT TROPONIN, ED: Troponin i, poc: 0 ng/mL (ref 0.00–0.08)

## 2014-04-16 LAB — URINALYSIS, ROUTINE W REFLEX MICROSCOPIC
Bilirubin Urine: NEGATIVE
Glucose, UA: NEGATIVE mg/dL
Ketones, ur: NEGATIVE mg/dL
NITRITE: NEGATIVE
PH: 6 (ref 5.0–8.0)
Protein, ur: NEGATIVE mg/dL
SPECIFIC GRAVITY, URINE: 1.018 (ref 1.005–1.030)
UROBILINOGEN UA: 0.2 mg/dL (ref 0.0–1.0)

## 2014-04-16 LAB — CBC WITH DIFFERENTIAL/PLATELET
Basophils Absolute: 0 10*3/uL (ref 0.0–0.1)
Basophils Relative: 0 % (ref 0–1)
EOS ABS: 0.4 10*3/uL (ref 0.0–0.7)
Eosinophils Relative: 5 % (ref 0–5)
HCT: 34.4 % — ABNORMAL LOW (ref 36.0–46.0)
HEMOGLOBIN: 10.9 g/dL — AB (ref 12.0–15.0)
LYMPHS ABS: 3.4 10*3/uL (ref 0.7–4.0)
Lymphocytes Relative: 43 % (ref 12–46)
MCH: 25.1 pg — AB (ref 26.0–34.0)
MCHC: 31.7 g/dL (ref 30.0–36.0)
MCV: 79.1 fL (ref 78.0–100.0)
MONOS PCT: 8 % (ref 3–12)
Monocytes Absolute: 0.6 10*3/uL (ref 0.1–1.0)
NEUTROS ABS: 3.4 10*3/uL (ref 1.7–7.7)
NEUTROS PCT: 44 % (ref 43–77)
PLATELETS: 375 10*3/uL (ref 150–400)
RBC: 4.35 MIL/uL (ref 3.87–5.11)
RDW: 16.5 % — ABNORMAL HIGH (ref 11.5–15.5)
WBC: 7.8 10*3/uL (ref 4.0–10.5)

## 2014-04-16 LAB — BASIC METABOLIC PANEL
BUN: 14 mg/dL (ref 6–23)
CHLORIDE: 105 meq/L (ref 96–112)
CO2: 26 mEq/L (ref 19–32)
Calcium: 10 mg/dL (ref 8.4–10.5)
Creatinine, Ser: 0.73 mg/dL (ref 0.50–1.10)
GLUCOSE: 160 mg/dL — AB (ref 70–99)
POTASSIUM: 3.9 meq/L (ref 3.7–5.3)
SODIUM: 144 meq/L (ref 137–147)

## 2014-04-16 NOTE — ED Notes (Signed)
Introduced myself to the patient and did bedside reporting with Lanora ManisElizabeth, PO, RN.

## 2014-04-16 NOTE — ED Provider Notes (Signed)
I saw and evaluated the patient, reviewed the resident's note and I agree with the findings and plan.   EKG Interpretation   Date/Time:  Friday April 16 2014 18:26:44 EDT Ventricular Rate:  82 PR Interval:  160 QRS Duration: 98 QT Interval:  392 QTC Calculation: 457 R Axis:   -17 Text Interpretation:  Normal sinus rhythm Anterior infarct , age  undetermined Abnormal ECG No significant change was found Confirmed by  [facility]  [facility], TREY ([id number]) on 04/16/2014 10:43:3536 PM      58 year old female presenting with left arm pain.  No other symptoms including no chest pain, shortness of breath, nausea, diaphoresis. On exam, well-appearing, nontoxic, normal respiratory effort, normal perfusion, left arm nontender to palpation, full-strength, 2+ peripheral pulses, good range of motion.  History is not consistent with ACS.  Plan discharge with supportive care and outpatient followup.  Clinical Impression: 1. Left arm pain       Candyce ChurnJohn David Shirly Bartosiewicz III, MD 04/17/14 [phone]

## 2014-04-16 NOTE — ED Notes (Addendum)
Patient in no acute distress and ambulatory. Patient was driving at 12- noon, and had sudden onset of left arm shooting pain from upper arm down to fingers. Describes pain as 6/10 shooting tingling pain. Patient denies SOB, CP, blurry vision, N/V/D. Patient endorses one episode of "dizziness" described more as a lightheaded sensation "i felt like i was going to pass out"  sts "didnt last long" but unable to give exact time frame. Patient denies second episode of dizziness. Patient describes pain as constant without relief. Patient radial pulses 2+ equal sensation.    Dr. Christain SacramentoBeaver at bedside.

## 2014-04-16 NOTE — ED Provider Notes (Signed)
CSN: [id number]     Arrival date & time 04/16/14  1755 History   First MD Initiated Contact with Patient 04/16/14 2116     Chief Complaint  Patient presents with  . Arm Pain     (Consider location/radiation/quality/duration/timing/severity/associated sxs/prior Treatment) The history is provided by the patient.   history of present illness: 58 year-old female with history of diabetes hypertension who presents with chief complaint left arm pain onset of symptoms was 10 hours prior arrival. Patient reports she was driving in the car when she had onset of pain starting mid humerus and radiating to the left fourth and fifth digits. Pain is described as "shooting and tingling".  It has been constant and unchanged since onset. She denies any exacerbation of the pain with movement of the arm. No history of trauma. No chest pain or shortness of breath. No loss of sensation or weakness in the arm.  Past Medical History  Diagnosis Date  . Hypertension   . Diabetes mellitus   . Epilepsy   . S/P cholecystectomy   . H/O: hysterectomy   . Hyperlipidemia    Past Surgical History  Procedure Laterality Date  . Cholecystectomy     Family History  Problem Relation Age of Onset  . Diabetes Mother   . Hypertension Mother   . Diabetes Father    History  Substance Use Topics  . Smoking status: Former Smoker    Types: Cigarettes  . Smokeless tobacco: Never Used  . Alcohol Use: Yes   OB History   Grav Para Term Preterm Abortions TAB SAB Ect Mult Living                 Review of Systems  Constitutional: Negative for fever and chills.  HENT: Negative for congestion.   Eyes: Negative for pain.  Respiratory: Negative for shortness of breath.   Cardiovascular: Negative for chest pain.  Gastrointestinal: Negative for nausea, vomiting, abdominal pain, diarrhea and constipation.  Genitourinary: Negative for dysuria.  Musculoskeletal: Negative for back pain.       Left arm pain  Skin: Negative for  rash and wound.  Neurological: Negative for weakness, numbness and headaches.  All other systems reviewed and are negative.     Allergies  Penicillins  Home Medications   Prior to Admission medications   Medication Sig Start Date End Date Taking? Authorizing Provider  albuterol (PROVENTIL HFA;VENTOLIN HFA) 108 (90 BASE) MCG/ACT inhaler Inhale 2 puffs into the lungs 2 (two) times daily as needed for wheezing or shortness of breath. For shortness of breath   Yes Historical Provider, MD  Biotin 5000 MCG CAPS Take 5,000 mcg by mouth daily.   Yes Historical Provider, MD  CARBAMAZEPINE PO Take 500 mg by mouth daily.   Yes Historical Provider, MD  indapamide (LOZOL) 1.25 MG tablet Take 1.25 mg by mouth daily.   Yes Historical Provider, MD  levocetirizine (XYZAL) 5 MG tablet Take 5 mg by mouth at bedtime.   Yes Historical Provider, MD  metoprolol tartrate (LOPRESSOR) 25 MG tablet Take 25 mg by mouth 2 (two) times daily.   Yes Historical Provider, MD  oxyCODONE-acetaminophen (PERCOCET) 5-325 MG per tablet Take 1-2 tablets by mouth every 4 (four) hours as needed for pain. 08/25/13  Yes Arthor CaptainAbigail Harris, PA-C  SitaGLIPtin-MetFORMIN HCl (JANUMET XR) [phone] MG TB24 Take 1 tablet by mouth daily with supper.   Yes Historical Provider, MD  valsartan-hydrochlorothiazide (DIOVAN-HCT) 320-25 MG per tablet Take 1 tablet by mouth daily.  Yes Historical Provider, MD   BP 152/91  Pulse 73  Temp(Src) 97.4 F (36.3 C) (Oral)  Resp 16  SpO2 98% Physical Exam  Nursing note and vitals reviewed. Constitutional: She is oriented to person, place, and time. She appears well-developed and well-nourished. No distress.  HENT:  Head: Normocephalic and atraumatic.  Eyes: Conjunctivae are normal.  Neck: Neck supple.  Cardiovascular: Normal rate, regular rhythm, normal heart sounds and intact distal pulses.   Pulmonary/Chest: Effort normal and breath sounds normal. She has no wheezes. She has no rales.  Abdominal:  Soft. She exhibits no distension. There is no tenderness.  Musculoskeletal: Normal range of motion.       Left shoulder: Normal. She exhibits normal range of motion, no tenderness, no deformity, no pain and normal pulse.       Left elbow: Normal.       Left wrist: Normal.       Cervical back: Normal.       Left upper arm: Normal.       Left forearm: Normal.       Left hand: Normal. She exhibits normal range of motion and normal capillary refill. Normal sensation noted. Normal strength noted.  Neurological: She is alert and oriented to person, place, and time. She has normal strength. No cranial nerve deficit or sensory deficit. Coordination and gait normal. GCS eye subscore is 4. GCS verbal subscore is 5. GCS motor subscore is 6.  Reflex Scores:      Tricep reflexes are 2+ on the left side.      Bicep reflexes are 2+ on the left side.      Brachioradialis reflexes are 2+ on the left side. Skin: Skin is warm and dry.    ED Course  Procedures (including critical care time) Labs Review Labs Reviewed  CBC WITH DIFFERENTIAL - Abnormal; Notable for the following:    Hemoglobin 10.9 (*)    HCT 34.4 (*)    MCH 25.1 (*)    RDW 16.5 (*)    All other components within normal limits  BASIC METABOLIC PANEL - Abnormal; Notable for the following:    Glucose, Bld 160 (*)    All other components within normal limits  URINALYSIS, ROUTINE W REFLEX MICROSCOPIC - Abnormal; Notable for the following:    APPearance CLOUDY (*)    Hgb urine dipstick SMALL (*)    Leukocytes, UA MODERATE (*)    All other components within normal limits  URINE MICROSCOPIC-ADD ON - Abnormal; Notable for the following:    Squamous Epithelial / LPF MANY (*)    Bacteria, UA MANY (*)    All other components within normal limits  I-STAT TROPOININ, ED    Imaging Review No results found.   EKG Interpretation   Date/Time:  Friday April 16 2014 18:26:44 EDT Ventricular Rate:  82 PR Interval:  160 QRS Duration: 98 QT  Interval:  392 QTC Calculation: 457 R Axis:   -17 Text Interpretation:  Normal sinus rhythm Anterior infarct , age  undetermined Abnormal ECG No significant change was found Confirmed by  [facility]  MD, TREY ([id number]) on 04/16/2014 10:43:36 PM      MDM   Final diagnoses:  Left arm pain    58 year-old female with history of hypertension, diabetes and hyperlipidemia who presents with 10 hours of constant shooting left arm pain with associated intermittent tingling paresthesia of the left fourth and fifth digits. AF VSS. Exam of the neck, left shoulder, left arm, left  elbow, and left hand all normal. Patient with normal sensation, strength, reflexes, and pulses throughout.  Sensation not consistent with CVA. Due to patient's PMH, obtained labs including troponin which were all negative. As patient symptoms are very atypical for ACS and symptoms have been constant for greater than 10 hours a do not feel additional troponins indicated.  Possible peripheral neuropathy. Patient instructed to follow with PCP. Strong return precautions given for any worsening of symptoms.  Cherre Robins, MD 04/17/14 [phone]

## 2014-04-16 NOTE — ED Notes (Signed)
The pt is c/o lt arm pain with dizziness today.  No sob no nausea.  No known injury

## 2014-04-17 NOTE — Discharge Instructions (Signed)

## 2014-04-17 NOTE — ED Notes (Signed)
Patient is alert and orientedx4.  Patient was explained discharge instructions and they understood them with no questions.   

## 2014-04-17 NOTE — ED Provider Notes (Signed)
I saw and evaluated the patient, reviewed the resident's note and I agree with the findings and plan.   EKG Interpretation   Date/Time:  Friday April 16 2014 18:26:44 EDT Ventricular Rate:  82 PR Interval:  160 QRS Duration: 98 QT Interval:  392 QTC Calculation: 457 R Axis:   -17 Text Interpretation:  Normal sinus rhythm Anterior infarct , age  undetermined Abnormal ECG No significant change was found Confirmed by  [facility]  [facility], TREY ([id number]) on 04/16/2014 10:43:36 PM        Jonny RuizJohn Young Berryavid Yarethzy Croak III, MD 04/17/14 [phone]

## 2014-09-27 ENCOUNTER — Emergency Department (HOSPITAL_COMMUNITY)
Admission: EM | Admit: 2014-09-27 | Discharge: 2014-09-27 | Disposition: A | Discharge status: Home / Self Care | Payer: Medicare Other | Attending: Emergency Medicine | Admitting: Emergency Medicine

## 2014-09-27 ENCOUNTER — Encounter (HOSPITAL_COMMUNITY): Payer: Self-pay | Admitting: Emergency Medicine

## 2014-09-27 DIAGNOSIS — M79671 Pain in right foot: Secondary | ICD-10-CM

## 2014-09-27 DIAGNOSIS — Z79899 Other long term (current) drug therapy: Secondary | ICD-10-CM | POA: Diagnosis not present

## 2014-09-27 DIAGNOSIS — Z880 Allergy status to penicillin: Secondary | ICD-10-CM | POA: Diagnosis not present

## 2014-09-27 DIAGNOSIS — Z9089 Acquired absence of other organs: Secondary | ICD-10-CM | POA: Insufficient documentation

## 2014-09-27 DIAGNOSIS — I10 Essential (primary) hypertension: Secondary | ICD-10-CM | POA: Insufficient documentation

## 2014-09-27 DIAGNOSIS — Z87891 Personal history of nicotine dependence: Secondary | ICD-10-CM | POA: Insufficient documentation

## 2014-09-27 DIAGNOSIS — G40909 Epilepsy, unspecified, not intractable, without status epilepticus: Secondary | ICD-10-CM | POA: Diagnosis not present

## 2014-09-27 DIAGNOSIS — Z90710 Acquired absence of both cervix and uterus: Secondary | ICD-10-CM | POA: Insufficient documentation

## 2014-09-27 DIAGNOSIS — E785 Hyperlipidemia, unspecified: Secondary | ICD-10-CM | POA: Insufficient documentation

## 2014-09-27 DIAGNOSIS — E119 Type 2 diabetes mellitus without complications: Secondary | ICD-10-CM | POA: Diagnosis not present

## 2014-09-27 LAB — CBG MONITORING, ED: Glucose-Capillary: 157 mg/dL — ABNORMAL HIGH (ref 70–99)

## 2014-09-27 MED ORDER — MORPHINE SULFATE 4 MG/ML IJ SOLN
6.0000 mg | Freq: Once | INTRAMUSCULAR | Status: AC
  Administered 2014-09-27: 6 mg via INTRAMUSCULAR
  Filled 2014-09-27: qty 2

## 2014-09-27 MED ORDER — IBUPROFEN 400 MG PO TABS
400.0000 mg | ORAL_TABLET | Freq: Once | ORAL | Status: AC
Start: 2014-09-27 — End: 2014-09-27
  Administered 2014-09-27: 400 mg via ORAL
  Filled 2014-09-27: qty 1

## 2014-09-27 MED ORDER — OXYCODONE-ACETAMINOPHEN 5-325 MG PO TABS: 1.0000 | ORAL_TABLET | ORAL | Status: DC | PRN

## 2014-09-27 NOTE — Discharge Instructions (Signed)

## 2014-09-27 NOTE — ED Notes (Addendum)
C/o R foot pain and swelling, onset yesterday morning, felt fine going to bed, but woke with pain and hurt more to walk on it, (denies: injury or other sx), no obvious swelling, redness or wounds, CMS intact. H/o DM. Took BC around 2200.

## 2014-10-03 ENCOUNTER — Encounter (HOSPITAL_COMMUNITY): Payer: Self-pay | Admitting: Emergency Medicine

## 2014-10-03 ENCOUNTER — Other Ambulatory Visit: Payer: Self-pay

## 2014-10-03 ENCOUNTER — Emergency Department (HOSPITAL_COMMUNITY): Payer: Medicare Other

## 2014-10-03 DIAGNOSIS — Z90710 Acquired absence of both cervix and uterus: Secondary | ICD-10-CM | POA: Diagnosis not present

## 2014-10-03 DIAGNOSIS — Z9089 Acquired absence of other organs: Secondary | ICD-10-CM | POA: Insufficient documentation

## 2014-10-03 DIAGNOSIS — Z79899 Other long term (current) drug therapy: Secondary | ICD-10-CM | POA: Diagnosis not present

## 2014-10-03 DIAGNOSIS — Z880 Allergy status to penicillin: Secondary | ICD-10-CM | POA: Insufficient documentation

## 2014-10-03 DIAGNOSIS — I10 Essential (primary) hypertension: Secondary | ICD-10-CM | POA: Insufficient documentation

## 2014-10-03 DIAGNOSIS — G40909 Epilepsy, unspecified, not intractable, without status epilepticus: Secondary | ICD-10-CM | POA: Diagnosis not present

## 2014-10-03 DIAGNOSIS — Z87891 Personal history of nicotine dependence: Secondary | ICD-10-CM | POA: Diagnosis not present

## 2014-10-03 DIAGNOSIS — E119 Type 2 diabetes mellitus without complications: Secondary | ICD-10-CM | POA: Insufficient documentation

## 2014-10-03 NOTE — ED Notes (Signed)
Patient presents stating she took her BP and it was elevated.  Noticed she was getting a headache and not feeling right.

## 2014-10-04 ENCOUNTER — Emergency Department (HOSPITAL_COMMUNITY)
Admission: EM | Admit: 2014-10-04 | Discharge: 2014-10-04 | Disposition: A | Discharge status: Home / Self Care | Payer: Medicare Other | Attending: Emergency Medicine | Admitting: Emergency Medicine

## 2014-10-04 DIAGNOSIS — I10 Essential (primary) hypertension: Secondary | ICD-10-CM

## 2014-10-04 LAB — COMPREHENSIVE METABOLIC PANEL
ALK PHOS: 90 U/L (ref 39–117)
ALT: 23 U/L (ref 0–35)
ANION GAP: 17 — AB (ref 5–15)
AST: 26 U/L (ref 0–37)
Albumin: 3.8 g/dL (ref 3.5–5.2)
BUN: 15 mg/dL (ref 6–23)
CHLORIDE: 99 meq/L (ref 96–112)
CO2: 23 meq/L (ref 19–32)
Calcium: 10 mg/dL (ref 8.4–10.5)
Creatinine, Ser: 0.92 mg/dL (ref 0.50–1.10)
GFR, EST AFRICAN AMERICAN: 80 mL/min — AB (ref >90)
GFR, EST NON AFRICAN AMERICAN: 69 mL/min — AB (ref >90)
GLUCOSE: 117 mg/dL — AB (ref 70–99)
POTASSIUM: 3.9 meq/L (ref 3.7–5.3)
Sodium: 139 mEq/L (ref 137–147)
Total Protein: 8 g/dL (ref 6.0–8.3)

## 2014-10-04 LAB — CBC WITH DIFFERENTIAL/PLATELET
Basophils Absolute: 0 10*3/uL (ref 0.0–0.1)
Basophils Relative: 0 % (ref 0–1)
Eosinophils Absolute: 0.3 10*3/uL (ref 0.0–0.7)
Eosinophils Relative: 4 % (ref 0–5)
HCT: 31.7 % — ABNORMAL LOW (ref 36.0–46.0)
HEMOGLOBIN: 10.2 g/dL — AB (ref 12.0–15.0)
LYMPHS PCT: 42 % (ref 12–46)
Lymphs Abs: 3.2 10*3/uL (ref 0.7–4.0)
MCH: 24.6 pg — ABNORMAL LOW (ref 26.0–34.0)
MCHC: 32.2 g/dL (ref 30.0–36.0)
MCV: 76.6 fL — ABNORMAL LOW (ref 78.0–100.0)
MONOS PCT: 7 % (ref 3–12)
Monocytes Absolute: 0.6 10*3/uL (ref 0.1–1.0)
NEUTROS ABS: 3.6 10*3/uL (ref 1.7–7.7)
NEUTROS PCT: 47 % (ref 43–77)
Platelets: 369 10*3/uL (ref 150–400)
RBC: 4.14 MIL/uL (ref 3.87–5.11)
RDW: 16.3 % — ABNORMAL HIGH (ref 11.5–15.5)
WBC: 7.8 10*3/uL (ref 4.0–10.5)

## 2014-10-04 NOTE — ED Provider Notes (Signed)
CSN: [id number]     Arrival date & time 10/03/14  2258 History   First MD Initiated Contact with Patient 10/04/14 0155     Chief Complaint  Patient presents with  . Hypertension     (Consider location/radiation/quality/duration/timing/severity/associated sxs/prior Treatment) HPI Dawn Rice is a 58 y.o. female with a past medical history of hypertension coming in with elevated blood pressure. Patient states she had some nuts around 4 PM.  She subsequently began developing palpitations, and shortness of breath. She also states she felt sick as if she was nauseous but no emesis. She had no diaphoresis and no chest pain. She then took her blood pressure and it was 180s over 110s. She wanted to be evaluated. She is currently denying any symptoms. All prior symptoms had resolved. She has never had an allergic reaction to nuts prior to this. She is currently denying any neurologic symptoms, chest pain, or shortness of breath.  10 Systems reviewed and are negative for acute change except as noted in the HPI.     Past Medical History  Diagnosis Date  . Hypertension   . Diabetes mellitus   . Epilepsy   . S/P cholecystectomy   . H/O: hysterectomy   . Hyperlipidemia    Past Surgical History  Procedure Laterality Date  . Cholecystectomy    . Abdominal hysterectomy     Family History  Problem Relation Age of Onset  . Diabetes Mother   . Hypertension Mother   . Diabetes Father    History  Substance Use Topics  . Smoking status: Former Smoker    Types: Cigarettes  . Smokeless tobacco: Never Used  . Alcohol Use: Yes   OB History   Grav Para Term Preterm Abortions TAB SAB Ect Mult Living                 Review of Systems    Allergies  Penicillins  Home Medications   Prior to Admission medications   Medication Sig Start Date End Date Taking? Authorizing Provider  albuterol (PROVENTIL HFA;VENTOLIN HFA) 108 (90 BASE) MCG/ACT inhaler Inhale 2 puffs into the lungs 2  (two) times daily as needed for wheezing or shortness of breath. For shortness of breath    Historical Provider, MD  Biotin 5000 MCG CAPS Take 5,000 mcg by mouth daily.    Historical Provider, MD  CARBAMAZEPINE PO Take 500 mg by mouth daily.    Historical Provider, MD  indapamide (LOZOL) 1.25 MG tablet Take 1.25 mg by mouth daily.    Historical Provider, MD  levocetirizine (XYZAL) 5 MG tablet Take 5 mg by mouth at bedtime.    Historical Provider, MD  metoprolol tartrate (LOPRESSOR) 25 MG tablet Take 25 mg by mouth 2 (two) times daily.    Historical Provider, MD  oxyCODONE-acetaminophen (PERCOCET) 5-325 MG per tablet Take 1-2 tablets by mouth every 4 (four) hours as needed for pain. 08/25/13   Arthor CaptainAbigail Harris, PA-C  oxyCODONE-acetaminophen (PERCOCET/ROXICET) 5-325 MG per tablet Take 1-2 tablets by mouth every 4 (four) hours as needed for moderate pain or severe pain. 09/27/14   Raeford RazorStephen Kohut, MD  SitaGLIPtin-MetFORMIN HCl (JANUMET XR) [phone] MG TB24 Take 1 tablet by mouth daily with supper.    Historical Provider, MD  valsartan-hydrochlorothiazide (DIOVAN-HCT) 320-25 MG per tablet Take 1 tablet by mouth daily.    Historical Provider, MD   BP 189/85  Pulse 68  Temp(Src) 97.5 F (36.4 C) (Oral)  Resp 18  Ht 5\' 2"  (1.575 m)  Wt 214 lb (97.07 kg)  BMI 39.13 kg/m2  SpO2 98% Physical Exam  Nursing note and vitals reviewed. Constitutional: She is oriented to person, place, and time. She appears well-developed and well-nourished. No distress.  HENT:  Head: Normocephalic and atraumatic.  Nose: Nose normal.  Mouth/Throat: Oropharynx is clear and moist. No oropharyngeal exudate.  Eyes: Conjunctivae and EOM are normal. Pupils are equal, round, and reactive to light. No scleral icterus.  Left iris has 2 different colors.  Neck: Normal range of motion. Neck supple. No JVD present. No tracheal deviation present. No thyromegaly present.  Cardiovascular: Normal rate, regular rhythm and normal heart sounds.   Exam reveals no gallop and no friction rub.   No murmur heard. Pulmonary/Chest: Effort normal and breath sounds normal. No respiratory distress. She has no wheezes. She exhibits no tenderness.  Abdominal: Soft. Bowel sounds are normal. She exhibits no distension and no mass. There is no tenderness. There is no rebound and no guarding.  Musculoskeletal: Normal range of motion. She exhibits no edema and no tenderness.  Lymphadenopathy:    She has no cervical adenopathy.  Neurological: She is alert and oriented to person, place, and time. No cranial nerve deficit. She exhibits normal muscle tone.  Skin: Skin is warm and dry. No rash noted. She is not diaphoretic. No erythema. No pallor.    ED Course  Procedures (including critical care time) Labs Review Labs Reviewed  CBC WITH DIFFERENTIAL - Abnormal; Notable for the following:    Hemoglobin 10.2 (*)    HCT 31.7 (*)    MCV 76.6 (*)    MCH 24.6 (*)    RDW 16.3 (*)    All other components within normal limits  COMPREHENSIVE METABOLIC PANEL - Abnormal; Notable for the following:    Glucose, Bld 117 (*)    Total Bilirubin <0.2 (*)    GFR calc non Af Amer 69 (*)    GFR calc Af Amer 80 (*)    Anion gap 17 (*)    All other components within normal limits    Imaging Review Dg Chest 2 View  10/04/2014   CLINICAL DATA:  Hypertension.  Initial encounter.  EXAM: CHEST  2 VIEW  COMPARISON:  09/24/2013  FINDINGS: Normal heart size. Stable mild aortic tortuosity. No acute infiltrate or edema. No effusion or pneumothorax. No acute osseous findings. Cholecystectomy.  IMPRESSION: No active cardiopulmonary disease.   Electronically Signed   By: Tiburcio PeaJonathan  Watts M.D.   On: 10/04/2014 00:11     EKG Interpretation None      MDM   Final diagnoses:  Essential hypertension    Patient presents emergency department out of concern for high blood pressure. Laboratory values were taken in triage and did not reveal any cause for high blood pressure.  She is currently asymptomatic. It is difficult to tell if the patient had an acute allergic reaction today. She is advised to no longer take not on the sole being seen by primary care physician. She is also advised to get a closer followup appointment within 3 days for management of her blood pressure. Patient was encouraged for diet and exercise. Her vital signs remain within her normal limits and she is asymptomatic. Patient safe for discharge.    Tomasita CrumbleAdeleke Dyllen Menning, MD 10/04/14 [phone]

## 2014-10-04 NOTE — Discharge Instructions (Signed)
How to Take Your Blood Pressure Ms. Dawn Rice, you were seen in the emergency department for high blood pressure. You are not showing any signs of stroke or heart attack. Please followup with your regular doctor within the next 3 days for continued treatment of her high blood pressure. If any of your symptoms worsen, or you develop confusion chest pain or shortness of breath, come back to the emergency department immediately for repeat evaluation. Thank you. HOW DO I GET A BLOOD PRESSURE MACHINE?  You can buy an electronic home blood pressure machine at your local pharmacy. Insurance will sometimes cover the cost if you have a prescription.  Ask your doctor what type of machine is best for you. There are different machines for your arm and your wrist.  If you decide to buy a machine to check your blood pressure on your arm, first check the size of your arm so you can buy the right size cuff. To check the size of your arm:   Use a measuring tape that shows both inches and centimeters.   Wrap the measuring tape around the upper-middle part of your arm. You may need someone to help you measure.   Write down your arm measurement in both inches and centimeters.   To measure your blood pressure correctly, it is important to have the right size cuff.   If your arm is up to 13 inches (up to 34 centimeters), get an adult cuff size.  If your arm is 13 to 17 inches (35 to 44 centimeters), get a large adult cuff size.    If your arm is 17 to 20 inches (45 to 52 centimeters), get an adult thigh cuff.  WHAT DO THE NUMBERS MEAN?   There are two numbers that make up your blood pressure. For example: 120/80.  The first number (120 in our example) is called the "systolic pressure." It is a measure of the pressure in your blood vessels when your heart is pumping blood.  The second number (80 in our example) is called the "diastolic pressure." It is a measure of the pressure in your blood vessels  when your heart is resting between beats.  Your doctor will tell you what your blood pressure should be. WHAT SHOULD I DO BEFORE I CHECK MY BLOOD PRESSURE?   Try to rest or relax for at least 30 minutes before you check your blood pressure.  Do not smoke.  Do not have any drinks with caffeine, such as:  Soda.  Coffee.  Tea.  Check your blood pressure in a quiet room.  Sit down and stretch out your arm on a table. Keep your arm at about the level of your heart. Let your arm relax.  Make sure that your legs are not crossed. HOW DO I CHECK MY BLOOD PRESSURE?  Follow the directions that came with your machine.  Make sure you remove any tight-fighting clothing from your arm or wrist. Wrap the cuff around your upper arm or wrist. You should be able to fit a finger between the cuff and your arm. If you cannot fit a finger between the cuff and your arm, it is too tight and should be removed and rewrapped.  Some units require you to manually pump up the arm cuff.  Automatic units inflate the cuff when you press a button.  Cuff deflation is automatic in both models.  After the cuff is inflated, the unit measures your blood pressure and pulse. The readings are shown on  a monitor. Hold still and breathe normally while the cuff is inflated.  Getting a reading takes less than a minute.  Some models store readings in a memory. Some provide a printout of readings. If your machine does not store your readings, keep a written record.  Take readings with you to your next visit with your doctor. Document Released: 11/22/2008 Document Revised: 04/26/2014 Document Reviewed: 02/04/2014 [facility] Patient Information 2015 [place], [place]. This information is not intended to replace advice given to you by your health care provider. Make sure you discuss any questions you have with your health care provider. Hypertension Hypertension is another name for high blood pressure. High blood pressure forces  your heart to work harder to pump blood. A blood pressure reading has two numbers, which includes a higher number over a lower number (example: 110/72). HOME CARE   Have your blood pressure rechecked by your doctor.  Only take medicine as told by your doctor. Follow the directions carefully. The medicine does not work as well if you skip doses. Skipping doses also puts you at risk for problems.  Do not smoke.  Monitor your blood pressure at home as told by your doctor. GET HELP IF:  You think you are having a reaction to the medicine you are taking.  You have repeat headaches or feel dizzy.  You have puffiness (swelling) in your ankles.  You have trouble with your vision. GET HELP RIGHT AWAY IF:   You get a very bad headache and are confused.  You feel weak, numb, or faint.  You get chest or belly (abdominal) pain.  You throw up (vomit).  You cannot breathe very well. MAKE SURE YOU:   Understand these instructions.  Will watch your condition.  Will get help right away if you are not doing well or get worse. Document Released: 05/28/2008 Document Revised: 12/15/2013 Document Reviewed: 10/02/2013 [facility] Patient Information 2015 [place], [place]. This information is not intended to replace advice given to you by your health care provider. Make sure you discuss any questions you have with your health care provider.

## 2014-10-06 NOTE — ED Provider Notes (Signed)
CSN: [id number]     Arrival date & time 09/27/14  0051 History   First MD Initiated Contact with Patient 09/27/14 0159     Chief Complaint  Patient presents with  . Foot Pain     (Consider location/radiation/quality/duration/timing/severity/associated sxs/prior Treatment) HPI  57yF with R foot pain. Denies trauma. Noticed yesterday morning when getting out of bed. Mild pain at rest. Worse with movement and ambulation. Appears swollen. NO rash. No fever or chills. No numbness/tingling. Feels like her strength is fine.   Past Medical History  Diagnosis Date  . Hypertension   . Diabetes mellitus   . Epilepsy   . S/P cholecystectomy   . H/O: hysterectomy   . Hyperlipidemia    Past Surgical History  Procedure Laterality Date  . Cholecystectomy    . Abdominal hysterectomy     Family History  Problem Relation Age of Onset  . Diabetes Mother   . Hypertension Mother   . Diabetes Father    History  Substance Use Topics  . Smoking status: Former Smoker    Types: Cigarettes  . Smokeless tobacco: Never Used  . Alcohol Use: Yes   OB History   Grav Para Term Preterm Abortions TAB SAB Ect Mult Living                 Review of Systems  All systems reviewed and negative, other than as noted in HPI.   Allergies  Penicillins  Home Medications   Prior to Admission medications   Medication Sig Start Date End Date Taking? Authorizing Provider  albuterol (PROVENTIL HFA;VENTOLIN HFA) 108 (90 BASE) MCG/ACT inhaler Inhale 2 puffs into the lungs 2 (two) times daily as needed for wheezing or shortness of breath. For shortness of breath    Historical Provider, MD  Biotin 5000 MCG CAPS Take 5,000 mcg by mouth daily.    Historical Provider, MD  CARBAMAZEPINE PO Take 500 mg by mouth daily.    Historical Provider, MD  metoprolol tartrate (LOPRESSOR) 25 MG tablet Take 25 mg by mouth 2 (two) times daily.    Historical Provider, MD  Multiple Vitamin (MULTIVITAMIN WITH MINERALS) TABS tablet  Take 1 tablet by mouth daily.    Historical Provider, MD  Omega-3 Fatty Acids (FISH OIL PO) Take 1 tablet by mouth daily.    Historical Provider, MD  oxyCODONE-acetaminophen (PERCOCET/ROXICET) 5-325 MG per tablet Take 1-2 tablets by mouth every 4 (four) hours as needed for moderate pain or severe pain. 09/27/14   Raeford RazorStephen Markham Dumlao, MD  SitaGLIPtin-MetFORMIN HCl (JANUMET XR) [phone] MG TB24 Take 1 tablet by mouth daily with supper.    Historical Provider, MD  valsartan-hydrochlorothiazide (DIOVAN-HCT) 320-25 MG per tablet Take 1 tablet by mouth daily.    Historical Provider, MD   BP 168/98  Pulse 85  Temp(Src) 98.1 F (36.7 C) (Oral)  Resp 18  SpO2 100% Physical Exam  Nursing note and vitals reviewed. Constitutional: She appears well-developed and well-nourished. No distress.  HENT:  Head: Normocephalic and atraumatic.  Eyes: Conjunctivae are normal. Right eye exhibits no discharge. Left eye exhibits no discharge.  Neck: Neck supple.  Cardiovascular: Normal rate, regular rhythm and normal heart sounds.  Exam reveals no gallop and no friction rub.   No murmur heard. Pulmonary/Chest: Effort normal and breath sounds normal. No respiratory distress.  Abdominal: Soft. She exhibits no distension and no mass. There is no tenderness.  Musculoskeletal: She exhibits no edema and no tenderness.  Right foot grossly normal appearance and symmetric as  compared to the left. No swelling. No concerning skin changes. Palpable DP pulse. Good cap refill in the toes. Good strength with plantar and dorsiflexion. Sensation intact to light touch.  Neurological: She is alert.  Skin: Skin is warm and dry.  Psychiatric: She has a normal mood and affect. Her behavior is normal. Thought content normal.    ED Course  Procedures (including critical care time) Labs Review Labs Reviewed  CBG MONITORING, ED - Abnormal; Notable for the following:    Glucose-Capillary 157 (*)    All other components within normal limits     Imaging Review No results found.   EKG Interpretation None      MDM   Final diagnoses:  Right foot pain    58 year old female with atraumatic right foot pain. Physical exam is unremarkable. Very low suspicion for fracture. Imaging deferred. No clinical signs of DVT. Doubt infectious etiology. Plan stenotic treatment. Return precautions discussed.    Raeford RazorStephen Zarinah Oviatt, MD 10/06/14 [phone]

## 2015-01-22 ENCOUNTER — Encounter (HOSPITAL_COMMUNITY): Payer: Self-pay | Admitting: Cardiology

## 2015-01-22 ENCOUNTER — Emergency Department (HOSPITAL_COMMUNITY)
Admission: EM | Admit: 2015-01-22 | Discharge: 2015-01-22 | Disposition: A | Discharge status: Home / Self Care | Payer: Medicare Other | Attending: Emergency Medicine | Admitting: Emergency Medicine

## 2015-01-22 DIAGNOSIS — M109 Gout, unspecified: Secondary | ICD-10-CM | POA: Diagnosis not present

## 2015-01-22 DIAGNOSIS — Z90710 Acquired absence of both cervix and uterus: Secondary | ICD-10-CM | POA: Diagnosis not present

## 2015-01-22 DIAGNOSIS — Z880 Allergy status to penicillin: Secondary | ICD-10-CM | POA: Diagnosis not present

## 2015-01-22 DIAGNOSIS — Z79899 Other long term (current) drug therapy: Secondary | ICD-10-CM | POA: Insufficient documentation

## 2015-01-22 DIAGNOSIS — G40909 Epilepsy, unspecified, not intractable, without status epilepticus: Secondary | ICD-10-CM | POA: Diagnosis not present

## 2015-01-22 DIAGNOSIS — R2241 Localized swelling, mass and lump, right lower limb: Secondary | ICD-10-CM | POA: Insufficient documentation

## 2015-01-22 DIAGNOSIS — E119 Type 2 diabetes mellitus without complications: Secondary | ICD-10-CM | POA: Insufficient documentation

## 2015-01-22 DIAGNOSIS — I10 Essential (primary) hypertension: Secondary | ICD-10-CM | POA: Diagnosis not present

## 2015-01-22 DIAGNOSIS — E785 Hyperlipidemia, unspecified: Secondary | ICD-10-CM | POA: Diagnosis not present

## 2015-01-22 DIAGNOSIS — M79671 Pain in right foot: Secondary | ICD-10-CM | POA: Diagnosis present

## 2015-01-22 DIAGNOSIS — Z87891 Personal history of nicotine dependence: Secondary | ICD-10-CM | POA: Insufficient documentation

## 2015-01-22 HISTORY — DX: Gout, unspecified: M10.9

## 2015-01-22 MED ORDER — OXYCODONE-ACETAMINOPHEN 5-325 MG PO TABS: 1.0000 | ORAL_TABLET | Freq: Four times a day (QID) | ORAL | Status: DC | PRN

## 2015-01-22 MED ORDER — INDOMETHACIN 25 MG PO CAPS: 25.0000 mg | ORAL_CAPSULE | Freq: Three times a day (TID) | ORAL | Status: DC | PRN

## 2015-01-22 MED ORDER — OXYCODONE-ACETAMINOPHEN 5-325 MG PO TABS
1.0000 | ORAL_TABLET | Freq: Once | ORAL | Status: AC
  Administered 2015-01-22: 1 via ORAL
  Filled 2015-01-22: qty 1

## 2015-01-22 NOTE — Discharge Instructions (Signed)
Please read and follow all provided instructions.  Your diagnoses today include:  1. Right foot pain     Tests performed today include:  Vital signs. See below for your results today.   Medications prescribed:   Percocet (oxycodone/acetaminophen) - narcotic pain medication  DO NOT drive or perform any activities that require you to be awake and alert because this medicine can make you drowsy. BE VERY CAREFUL not to take multiple medicines containing Tylenol (also called acetaminophen). Doing so can lead to an overdose which can damage your liver and cause liver failure and possibly death.   Indomethacin - anti-inflammatory medicine  You have been prescribed an anti-inflammatory medication or NSAID. Take with food. Take smallest effective dose for the shortest duration needed for your pain. Stop taking if you experience stomach pain or vomiting.   Take any prescribed medications only as directed.  Home care instructions:   Follow any educational materials contained in this packet  Follow R.I.C.E. Protocol:  R - rest your injury   I  - use ice on injury without applying directly to skin  C - compress injury with bandage or splint  E - elevate the injury as much as possible  Follow-up instructions: Please follow-up with your primary care provider or your foot doctor if you continue to have significant pain in 3 days.    Return instructions:   Please return if your toes or feet are numb or tingling, appear gray or blue, or you have severe pain (also elevate the leg and loosen splint or wrap if you were given one)  Please return to the Emergency Department if you experience worsening symptoms.   Please return if you have any other emergent concerns.  Additional Information:  Your vital signs today were: BP 162/103 mmHg   Pulse 87   Temp(Src) 98 F (36.7 C)   Resp 18   Ht 5\' 2"  (1.575 m)   Wt 209 lb (94.802 kg)   BMI 38.22 kg/m2   SpO2 96% If your blood pressure (BP)  was elevated above 135/85 this visit, please have this repeated by your doctor within one month. --------------

## 2015-01-22 NOTE — ED Notes (Signed)
Pt reports bilateral foot pain for the past week. States that she has hx of gout.

## 2015-01-22 NOTE — ED Provider Notes (Signed)
CSN: [id number]     Arrival date & time 01/22/15  1124 History  This chart was scribed for Dawn BleacherJosh Akari Defelice, PA-C, working with Tilden FossaElizabeth Rees, MD by Chestine SporeSoijett Blue, ED Scribe. The patient was seen in room TR10C/TR10C at 1:40 PM.    Chief Complaint  Patient presents with  . Foot Pain     Patient is a 59 y.o. female presenting with lower extremity pain. The history is provided by the patient. No language interpreter was used.  Foot Pain    HPI Comments: Dawn BanterLinda F Rice is a 59 y.o. female with a hx of gout who presents to the Emergency Department complaining of right foot pain onset 1 week. She was dx with gout in October 2015 by blood work at her PCP. She notes that her symptoms began in her toes and then spread to the top of her right foot. She denies eating seafood or red meat. She reports that she eats leafy greens a lot (Collard greens). She notes that she is a diabetic and her sugars are typically in the 100's. She notes that the highest her sugar has been is in the 200 range. She reports that she is taking allopurinol for her gout. She states that she is having associated symptoms of joint swelling. She states that she has tried Ibuprofen PM with no relief for her symptoms. She denies knee pain, and any other symptoms.   Past Medical History  Diagnosis Date  . Hypertension   . Diabetes mellitus   . Epilepsy   . S/P cholecystectomy   . H/O: hysterectomy   . Hyperlipidemia   . Gout    Past Surgical History  Procedure Laterality Date  . Cholecystectomy    . Abdominal hysterectomy     Family History  Problem Relation Age of Onset  . Diabetes Mother   . Hypertension Mother   . Diabetes Father    History  Substance Use Topics  . Smoking status: Former Smoker    Types: Cigarettes  . Smokeless tobacco: Never Used  . Alcohol Use: Yes   OB History    No data available     Review of Systems  Constitutional: Negative for fever, chills and activity change.  Musculoskeletal:  Positive for joint swelling, arthralgias and gait problem. Negative for myalgias, back pain and neck pain.  Skin: Negative for wound.  Neurological: Negative for weakness and numbness.      Allergies  Penicillins  Home Medications   Prior to Admission medications   Medication Sig Start Date End Date Taking? Authorizing Provider  albuterol (PROVENTIL HFA;VENTOLIN HFA) 108 (90 BASE) MCG/ACT inhaler Inhale 2 puffs into the lungs 2 (two) times daily as needed for wheezing or shortness of breath. For shortness of breath    Historical Provider, MD  Biotin 5000 MCG CAPS Take 5,000 mcg by mouth daily.    Historical Provider, MD  CARBAMAZEPINE PO Take 500 mg by mouth daily.    Historical Provider, MD  metoprolol tartrate (LOPRESSOR) 25 MG tablet Take 25 mg by mouth 2 (two) times daily.    Historical Provider, MD  Multiple Vitamin (MULTIVITAMIN WITH MINERALS) TABS tablet Take 1 tablet by mouth daily.    Historical Provider, MD  Omega-3 Fatty Acids (FISH OIL PO) Take 1 tablet by mouth daily.    Historical Provider, MD  oxyCODONE-acetaminophen (PERCOCET/ROXICET) 5-325 MG per tablet Take 1-2 tablets by mouth every 4 (four) hours as needed for moderate pain or severe pain. 09/27/14   Raeford RazorStephen Kohut, MD  SitaGLIPtin-MetFORMIN HCl (JANUMET XR) [phone] MG TB24 Take 1 tablet by mouth daily with supper.    Historical Provider, MD  valsartan-hydrochlorothiazide (DIOVAN-HCT) 320-25 MG per tablet Take 1 tablet by mouth daily.    Historical Provider, MD   BP 162/103 mmHg  Pulse 87  Temp(Src) 98 F (36.7 C)  Resp 18  Ht  (1.575 m)  Wt 209 lb (94.802 kg)  BMI 38.22 kg/m2  SpO2 96%  Physical Exam  Constitutional: She is oriented to person, place, and time. She appears well-developed and well-nourished. No distress.  HENT:  Head: Normocephalic and atraumatic.  Eyes: EOM are normal. Pupils are equal, round, and reactive to light.  Neck: Normal range of motion. Neck supple. No tracheal deviation  present.  Cardiovascular: Normal rate.  Exam reveals no decreased pulses.   Pulmonary/Chest: Effort normal. No respiratory distress.  Musculoskeletal: Normal range of motion. She exhibits tenderness. She exhibits no edema.       Right knee: Normal.       Right ankle: She exhibits normal range of motion and no swelling. Tenderness.       Right lower leg: Normal.       Right foot: There is tenderness. There is normal range of motion, no bony tenderness and no swelling.       Feet:  Neurological: She is alert and oriented to person, place, and time. No sensory deficit.  Motor, sensation, and vascular distal to the injury is fully intact.   Skin: Skin is warm and dry.  Psychiatric: She has a normal mood and affect. Her behavior is normal.  Nursing note and vitals reviewed.   ED Course  Procedures (including critical care time) DIAGNOSTIC STUDIES: Oxygen Saturation is 96% on room air, normal by my interpretation.    COORDINATION OF CARE: 1:44 PM-Discussed treatment plan which includes pain medication and indomethacin Rx, F/U with podiatrist with pt at bedside and pt agreed to plan.   Labs Review Labs Reviewed - No data to display  Imaging Review No results found.   EKG Interpretation None       Vital signs reviewed and are as follows: Filed Vitals:   01/22/15 1129  BP: 162/103  Pulse: 87  Temp: 98 F (36.7 C)  Resp: 18   Delay in seeing patient as she was accidentally discharged out of system on arrival.   MDM   Final diagnoses:  Right foot pain   Patient with history of gout -- sounds like she had elevated uric acid test, no definitive confirmation by joint aspiration. Will treat as gout with pain medications and indomethacin. Pt told to discontinue ibuprofen. Will avoid prednisone given DM. She has podiatrist to follow-up with regarding this.   I personally performed the services described in this documentation, which was scribed in my presence. The recorded  information has been reviewed and is accurate.    Renne Crigler, PA-C 01/22/15 1357  Tilden Fossa, MD 01/22/15 [phone]

## 2015-07-22 ENCOUNTER — Other Ambulatory Visit: Payer: Self-pay | Admitting: Family

## 2015-07-22 ENCOUNTER — Ambulatory Visit
Admission: RE | Admit: 2015-07-22 | Discharge: 2015-07-22 | Disposition: A | Discharge status: Home / Self Care | Payer: Medicare Other | Source: Ambulatory Visit | Attending: Family | Admitting: Family

## 2015-07-22 DIAGNOSIS — M25511 Pain in right shoulder: Secondary | ICD-10-CM

## 2015-09-03 ENCOUNTER — Encounter (HOSPITAL_COMMUNITY): Payer: Self-pay | Admitting: Emergency Medicine

## 2015-09-03 ENCOUNTER — Emergency Department (HOSPITAL_COMMUNITY): Payer: Medicare Other

## 2015-09-03 ENCOUNTER — Emergency Department (HOSPITAL_COMMUNITY)
Admission: EM | Admit: 2015-09-03 | Discharge: 2015-09-03 | Disposition: A | Discharge status: Home / Self Care | Payer: Medicare Other | Attending: Emergency Medicine | Admitting: Emergency Medicine

## 2015-09-03 DIAGNOSIS — R079 Chest pain, unspecified: Secondary | ICD-10-CM

## 2015-09-03 DIAGNOSIS — Z87891 Personal history of nicotine dependence: Secondary | ICD-10-CM | POA: Insufficient documentation

## 2015-09-03 DIAGNOSIS — Z8739 Personal history of other diseases of the musculoskeletal system and connective tissue: Secondary | ICD-10-CM | POA: Insufficient documentation

## 2015-09-03 DIAGNOSIS — Z7982 Long term (current) use of aspirin: Secondary | ICD-10-CM | POA: Insufficient documentation

## 2015-09-03 DIAGNOSIS — E119 Type 2 diabetes mellitus without complications: Secondary | ICD-10-CM | POA: Insufficient documentation

## 2015-09-03 DIAGNOSIS — Z79899 Other long term (current) drug therapy: Secondary | ICD-10-CM | POA: Insufficient documentation

## 2015-09-03 DIAGNOSIS — I10 Essential (primary) hypertension: Secondary | ICD-10-CM | POA: Diagnosis not present

## 2015-09-03 DIAGNOSIS — Z9049 Acquired absence of other specified parts of digestive tract: Secondary | ICD-10-CM | POA: Insufficient documentation

## 2015-09-03 DIAGNOSIS — G40909 Epilepsy, unspecified, not intractable, without status epilepticus: Secondary | ICD-10-CM | POA: Insufficient documentation

## 2015-09-03 DIAGNOSIS — Z880 Allergy status to penicillin: Secondary | ICD-10-CM | POA: Diagnosis not present

## 2015-09-03 LAB — CBC
HEMATOCRIT: 33.4 % — AB (ref 36.0–46.0)
HEMOGLOBIN: 10 g/dL — AB (ref 12.0–15.0)
MCH: 22.1 pg — ABNORMAL LOW (ref 26.0–34.0)
MCHC: 29.9 g/dL — ABNORMAL LOW (ref 30.0–36.0)
MCV: 73.7 fL — AB (ref 78.0–100.0)
PLATELETS: 373 10*3/uL (ref 150–400)
RBC: 4.53 MIL/uL (ref 3.87–5.11)
RDW: 17.5 % — ABNORMAL HIGH (ref 11.5–15.5)
WBC: 7.7 10*3/uL (ref 4.0–10.5)

## 2015-09-03 LAB — I-STAT TROPONIN, ED: Troponin i, poc: 0 ng/mL (ref 0.00–0.08)

## 2015-09-03 LAB — BASIC METABOLIC PANEL
Anion gap: 11 (ref 5–15)
BUN: 12 mg/dL (ref 6–20)
CHLORIDE: 105 mmol/L (ref 101–111)
CO2: 23 mmol/L (ref 22–32)
Calcium: 9.4 mg/dL (ref 8.9–10.3)
Creatinine, Ser: 0.8 mg/dL (ref 0.44–1.00)
GFR calc non Af Amer: >60 mL/min (ref >60)
Glucose, Bld: 109 mg/dL — ABNORMAL HIGH (ref 65–99)
POTASSIUM: 3.8 mmol/L (ref 3.5–5.1)
SODIUM: 139 mmol/L (ref 135–145)

## 2015-09-03 MED ORDER — HYDROCODONE-ACETAMINOPHEN 5-325 MG PO TABS: 1.0000 | ORAL_TABLET | Freq: Four times a day (QID) | ORAL | Status: DC | PRN

## 2015-09-03 MED ORDER — KETOROLAC TROMETHAMINE 30 MG/ML IJ SOLN
30.0000 mg | Freq: Once | INTRAMUSCULAR | Status: AC
  Administered 2015-09-03: 30 mg via INTRAVENOUS
  Filled 2015-09-03: qty 1

## 2015-09-03 NOTE — Discharge Instructions (Signed)
Ibuprofen 600 mg every 6 hours as needed for pain.  Hydrocodone as needed for pain not relieved with ibuprofen.  Follow-up with cardiology next week if not improving. The contact information has been provided for you to call to arrange this appointment.   Chest Pain (Nonspecific) It is often hard to give a specific diagnosis for the cause of chest pain. There is always a chance that your pain could be related to something serious, such as a heart attack or a blood clot in the lungs. You need to follow up with your health care provider for further evaluation. CAUSES   Heartburn.  Pneumonia or bronchitis.  Anxiety or stress.  Inflammation around your heart (pericarditis) or lung (pleuritis or pleurisy).  A blood clot in the lung.  A collapsed lung (pneumothorax). It can develop suddenly on its own (spontaneous pneumothorax) or from trauma to the chest.  Shingles infection (herpes zoster virus). The chest wall is composed of bones, muscles, and cartilage. Any of these can be the source of the pain.  The bones can be bruised by injury.  The muscles or cartilage can be strained by coughing or overwork.  The cartilage can be affected by inflammation and become sore (costochondritis). DIAGNOSIS  Lab tests or other studies may be needed to find the cause of your pain. Your health care provider may have you take a test called an ambulatory electrocardiogram (ECG). An ECG records your heartbeat patterns over a 24-hour period. You may also have other tests, such as:  Transthoracic echocardiogram (TTE). During echocardiography, sound waves are used to evaluate how blood flows through your heart.  Transesophageal echocardiogram (TEE).  Cardiac monitoring. This allows your health care provider to monitor your heart rate and rhythm in real time.  Holter monitor. This is a portable device that records your heartbeat and can help diagnose heart arrhythmias. It allows your health care provider  to track your heart activity for several days, if needed.  Stress tests by exercise or by giving medicine that makes the heart beat faster. TREATMENT   Treatment depends on what may be causing your chest pain. Treatment may include:  Acid blockers for heartburn.  Anti-inflammatory medicine.  Pain medicine for inflammatory conditions.  Antibiotics if an infection is present.  You may be advised to change lifestyle habits. This includes stopping smoking and avoiding alcohol, caffeine, and chocolate.  You may be advised to keep your head raised (elevated) when sleeping. This reduces the chance of acid going backward from your stomach into your esophagus. Most of the time, nonspecific chest pain will improve within 2-3 days with rest and mild pain medicine.  HOME CARE INSTRUCTIONS   If antibiotics were prescribed, take them as directed. Finish them even if you start to feel better.  For the next few days, avoid physical activities that bring on chest pain. Continue physical activities as directed.  Do not use any tobacco products, including cigarettes, chewing tobacco, or electronic cigarettes.  Avoid drinking alcohol.  Only take medicine as directed by your health care provider.  Follow your health care provider's suggestions for further testing if your chest pain does not go away.  Keep any follow-up appointments you made. If you do not go to an appointment, you could develop lasting (chronic) problems with pain. If there is any problem keeping an appointment, call to reschedule. SEEK MEDICAL CARE IF:   Your chest pain does not go away, even after treatment.  You have a rash with blisters on your  chest.  You have a fever. SEEK IMMEDIATE MEDICAL CARE IF:   You have increased chest pain or pain that spreads to your arm, neck, jaw, back, or abdomen.  You have shortness of breath.  You have an increasing cough, or you cough up blood.  You have severe back or abdominal  pain.  You feel nauseous or vomit.  You have severe weakness.  You faint.  You have chills. This is an emergency. Do not wait to see if the pain will go away. Get medical help at once. Call your local emergency services (911 in [place] Do not drive yourself to the hospital. MAKE SURE YOU:   Understand these instructions.  Will watch your condition.  Will get help right away if you are not doing well or get worse. Document Released: 09/19/2005 Document Revised: 12/15/2013 Document Reviewed: 07/15/2008 [facility] Patient Information 2015 [place], [place]. This information is not intended to replace advice given to you by your health care provider. Make sure you discuss any questions you have with your health care provider.  Chest Pain (Nonspecific) It is often hard to give a specific diagnosis for the cause of chest pain. There is always a chance that your pain could be related to something serious, such as a heart attack or a blood clot in the lungs. You need to follow up with your health care provider for further evaluation. CAUSES   Heartburn.  Pneumonia or bronchitis.  Anxiety or stress.  Inflammation around your heart (pericarditis) or lung (pleuritis or pleurisy).  A blood clot in the lung.  A collapsed lung (pneumothorax). It can develop suddenly on its own (spontaneous pneumothorax) or from trauma to the chest.  Shingles infection (herpes zoster virus). The chest wall is composed of bones, muscles, and cartilage. Any of these can be the source of the pain.  The bones can be bruised by injury.  The muscles or cartilage can be strained by coughing or overwork.  The cartilage can be affected by inflammation and become sore (costochondritis). DIAGNOSIS  Lab tests or other studies may be needed to find the cause of your pain. Your health care provider may have you take a test called an ambulatory electrocardiogram (ECG). An ECG records your heartbeat patterns over a 24-hour  period. You may also have other tests, such as:  Transthoracic echocardiogram (TTE). During echocardiography, sound waves are used to evaluate how blood flows through your heart.  Transesophageal echocardiogram (TEE).  Cardiac monitoring. This allows your health care provider to monitor your heart rate and rhythm in real time.  Holter monitor. This is a portable device that records your heartbeat and can help diagnose heart arrhythmias. It allows your health care provider to track your heart activity for several days, if needed.  Stress tests by exercise or by giving medicine that makes the heart beat faster. TREATMENT   Treatment depends on what may be causing your chest pain. Treatment may include:  Acid blockers for heartburn.  Anti-inflammatory medicine.  Pain medicine for inflammatory conditions.  Antibiotics if an infection is present.  You may be advised to change lifestyle habits. This includes stopping smoking and avoiding alcohol, caffeine, and chocolate.  You may be advised to keep your head raised (elevated) when sleeping. This reduces the chance of acid going backward from your stomach into your esophagus. Most of the time, nonspecific chest pain will improve within 2-3 days with rest and mild pain medicine.  HOME CARE INSTRUCTIONS   If antibiotics were prescribed, take  them as directed. Finish them even if you start to feel better.  For the next few days, avoid physical activities that bring on chest pain. Continue physical activities as directed.  Do not use any tobacco products, including cigarettes, chewing tobacco, or electronic cigarettes.  Avoid drinking alcohol.  Only take medicine as directed by your health care provider.  Follow your health care provider's suggestions for further testing if your chest pain does not go away.  Keep any follow-up appointments you made. If you do not go to an appointment, you could develop lasting (chronic) problems with  pain. If there is any problem keeping an appointment, call to reschedule. SEEK MEDICAL CARE IF:   Your chest pain does not go away, even after treatment.  You have a rash with blisters on your chest.  You have a fever. SEEK IMMEDIATE MEDICAL CARE IF:   You have increased chest pain or pain that spreads to your arm, neck, jaw, back, or abdomen.  You have shortness of breath.  You have an increasing cough, or you cough up blood.  You have severe back or abdominal pain.  You feel nauseous or vomit.  You have severe weakness.  You faint.  You have chills. This is an emergency. Do not wait to see if the pain will go away. Get medical help at once. Call your local emergency services (911 in [place] Do not drive yourself to the hospital. MAKE SURE YOU:   Understand these instructions.  Will watch your condition.  Will get help right away if you are not doing well or get worse. Document Released: 09/19/2005 Document Revised: 12/15/2013 Document Reviewed: 07/15/2008 [facility] Patient Information 2015 [place], [place]. This information is not intended to replace advice given to you by your health care provider. Make sure you discuss any questions you have with your health care provider.

## 2015-09-03 NOTE — ED Provider Notes (Signed)
CSN: [id number]     Arrival date & time 09/03/15  0410 History   First MD Initiated Contact with Patient 09/03/15 [phone]     Chief Complaint  Patient presents with  . Chest Pain     (Consider location/radiation/quality/duration/timing/severity/associated sxs/prior Treatment) HPI Comments: Patient is a 59 year old female with history of hypertension, diabetes. She presents for evaluation of chest tightness that woke her from sleep at approximately 3 AM. She denies to me she is experiencing any shortness of breath, nausea, diaphoresis, or radiation to the arm or jaw.  Patient is a 59 y.o. female presenting with chest pain. The history is provided by the patient.  Chest Pain Pain location:  Substernal area Pain quality: tightness   Pain radiates to:  Does not radiate Pain radiates to the back: no   Pain severity:  Moderate Onset quality:  Sudden Duration:  3 hours Timing:  Constant Progression:  Unchanged Chronicity:  New Relieved by:  Nothing Worsened by:  Nothing tried Ineffective treatments:  None tried   Past Medical History  Diagnosis Date  . Hypertension   . Diabetes mellitus   . Epilepsy   . S/P cholecystectomy   . H/O: hysterectomy   . Hyperlipidemia   . Gout    Past Surgical History  Procedure Laterality Date  . Cholecystectomy    . Abdominal hysterectomy     Family History  Problem Relation Age of Onset  . Diabetes Mother   . Hypertension Mother   . Diabetes Father    Social History  Substance Use Topics  . Smoking status: Former Smoker    Types: Cigarettes  . Smokeless tobacco: Never Used  . Alcohol Use: Yes   OB History    No data available     Review of Systems  Cardiovascular: Positive for chest pain.  All other systems reviewed and are negative.     Allergies  Penicillins  Home Medications   Prior to Admission medications   Medication Sig Start Date End Date Taking? Authorizing Provider  aspirin 81 MG chewable tablet Chew 81 mg by  mouth daily.   Yes Historical Provider, MD  atorvastatin (LIPITOR) 80 MG tablet Take 80 mg by mouth daily. 06/26/15  Yes Historical Provider, MD  carbamazepine (CARBATROL) 200 MG 12 hr capsule Take 200 mg by mouth daily. 08/19/15  Yes Historical Provider, MD  lisinopril (PRINIVIL,ZESTRIL) 40 MG tablet Take 40 mg by mouth daily. 07/05/15  Yes Historical Provider, MD  metoprolol tartrate (LOPRESSOR) 25 MG tablet Take 25 mg by mouth 2 (two) times daily.   Yes Historical Provider, MD  SitaGLIPtin-MetFORMIN HCl (JANUMET XR) [phone] MG TB24 Take 1 tablet by mouth daily with supper.   Yes Historical Provider, MD  indomethacin (INDOCIN) 25 MG capsule Take 1-2 capsules (25-50 mg total) by mouth 3 (three) times daily as needed for mild pain or moderate pain. Patient not taking: Reported on 09/03/2015 01/22/15   Renne Crigler, PA-C  oxyCODONE-acetaminophen (PERCOCET/ROXICET) 5-325 MG per tablet Take 1-2 tablets by mouth every 6 (six) hours as needed for severe pain. Patient not taking: Reported on 09/03/2015 01/22/15   Renne Crigler, PA-C   BP 144/48 mmHg  Pulse 75  Temp(Src) 98.1 F (36.7 C) (Oral)  Resp 15  SpO2 100% Physical Exam  Constitutional: She is oriented to person, place, and time. She appears well-developed and well-nourished. No distress.  HENT:  Head: Normocephalic and atraumatic.  Mouth/Throat: Oropharynx is clear and moist.  Neck: Normal range of motion. Neck supple.  Cardiovascular: Normal rate and regular rhythm.  Exam reveals no gallop and no friction rub.   No murmur heard. Pulmonary/Chest: Effort normal and breath sounds normal. No respiratory distress. She has no wheezes. She has no rales.  Abdominal: Soft. Bowel sounds are normal. She exhibits no distension. There is no tenderness.  Musculoskeletal: Normal range of motion. She exhibits no edema.  Neurological: She is alert and oriented to person, place, and time.  Skin: Skin is warm and dry. She is not diaphoretic.  Nursing note  and vitals reviewed.   ED Course  Procedures (including critical care time) Labs Review Labs Reviewed  CBC - Abnormal; Notable for the following:    Hemoglobin 10.0 (*)    HCT 33.4 (*)    MCV 73.7 (*)    MCH 22.1 (*)    MCHC 29.9 (*)    RDW 17.5 (*)    All other components within normal limits  BASIC METABOLIC PANEL  I-STAT TROPOININ, ED    Imaging Review Dg Chest 2 View  09/03/2015   CLINICAL DATA:  Chest tightness and left arm numbness. Shortness of breath since last afternoon.  EXAM: CHEST  2 VIEW  COMPARISON:  10/03/2014  FINDINGS: The heart size and mediastinal contours are within normal limits. Both lungs are clear. The visualized skeletal structures are unremarkable. Surgical clips in the right upper quadrant.  IMPRESSION: No active cardiopulmonary disease.   Electronically Signed   By: Burman Nieves M.D.   On: 09/03/2015 05:40   I have personally reviewed and evaluated these images and lab results as part of my medical decision-making.   EKG Interpretation   Date/Time:  Saturday September 03 2015 04:15:24 EDT Ventricular Rate:  78 PR Interval:  160 QRS Duration: 94 QT Interval:  392 QTC Calculation: 446 R Axis:   -13 Text Interpretation:  Normal sinus rhythm Possible Anterior infarct , age  undetermined Abnormal ECG Confirmed by Lorrie Strauch  MD, Adelaide Pfefferkorn ([id number]) on  09/03/2015 6:18:02 AM      MDM   Final diagnoses:  None    EKG is unchanged and troponin is negative. She is feeling better after Toradol. I highly suspect this is a musculoskeletal discomfort do not believe it is cardiac in nature. She will be discharged with anti-inflammatory medication and when necessary follow-up with cardiology if not improving.    Geoffery Lyons, MD 09/03/15 [phone]

## 2015-09-03 NOTE — ED Notes (Signed)
Patient returned from xray, dr. Judd Lien at the bedside.

## 2015-09-03 NOTE — ED Notes (Addendum)
Pt reports tightness across chest and L arm tingling that woke her up approx 3 hours ago.  Also reports mild sob.  Denies nausea and vomiting.

## 2015-12-09 ENCOUNTER — Ambulatory Visit
Admission: RE | Admit: 2015-12-09 | Discharge: 2015-12-09 | Disposition: A | Discharge status: Home / Self Care | Payer: Medicare Other | Source: Ambulatory Visit | Attending: Internal Medicine | Admitting: Internal Medicine

## 2015-12-09 ENCOUNTER — Other Ambulatory Visit: Payer: Self-pay | Admitting: Internal Medicine

## 2015-12-09 DIAGNOSIS — M79641 Pain in right hand: Secondary | ICD-10-CM

## 2015-12-09 DIAGNOSIS — M79642 Pain in left hand: Secondary | ICD-10-CM

## 2016-02-15 ENCOUNTER — Emergency Department (HOSPITAL_COMMUNITY): Payer: Medicare Other

## 2016-02-15 ENCOUNTER — Emergency Department (HOSPITAL_COMMUNITY)
Admission: EM | Admit: 2016-02-15 | Discharge: 2016-02-15 | Disposition: A | Discharge status: Home / Self Care | Payer: Medicare Other | Attending: Emergency Medicine | Admitting: Emergency Medicine

## 2016-02-15 ENCOUNTER — Encounter (HOSPITAL_COMMUNITY): Payer: Self-pay | Admitting: Adult Health

## 2016-02-15 DIAGNOSIS — Z880 Allergy status to penicillin: Secondary | ICD-10-CM | POA: Insufficient documentation

## 2016-02-15 DIAGNOSIS — G40909 Epilepsy, unspecified, not intractable, without status epilepticus: Secondary | ICD-10-CM | POA: Diagnosis not present

## 2016-02-15 DIAGNOSIS — Z79899 Other long term (current) drug therapy: Secondary | ICD-10-CM | POA: Insufficient documentation

## 2016-02-15 DIAGNOSIS — Z7982 Long term (current) use of aspirin: Secondary | ICD-10-CM | POA: Diagnosis not present

## 2016-02-15 DIAGNOSIS — R079 Chest pain, unspecified: Secondary | ICD-10-CM | POA: Insufficient documentation

## 2016-02-15 DIAGNOSIS — E785 Hyperlipidemia, unspecified: Secondary | ICD-10-CM | POA: Diagnosis not present

## 2016-02-15 DIAGNOSIS — M109 Gout, unspecified: Secondary | ICD-10-CM | POA: Diagnosis not present

## 2016-02-15 DIAGNOSIS — E119 Type 2 diabetes mellitus without complications: Secondary | ICD-10-CM | POA: Diagnosis not present

## 2016-02-15 DIAGNOSIS — R0602 Shortness of breath: Secondary | ICD-10-CM | POA: Diagnosis not present

## 2016-02-15 DIAGNOSIS — I10 Essential (primary) hypertension: Secondary | ICD-10-CM | POA: Diagnosis not present

## 2016-02-15 DIAGNOSIS — Z87891 Personal history of nicotine dependence: Secondary | ICD-10-CM | POA: Diagnosis not present

## 2016-02-15 DIAGNOSIS — Z7984 Long term (current) use of oral hypoglycemic drugs: Secondary | ICD-10-CM | POA: Insufficient documentation

## 2016-02-15 DIAGNOSIS — M79602 Pain in left arm: Secondary | ICD-10-CM | POA: Diagnosis not present

## 2016-02-15 LAB — CBC
HCT: 34.3 % — ABNORMAL LOW (ref 36.0–46.0)
Hemoglobin: 10.4 g/dL — ABNORMAL LOW (ref 12.0–15.0)
MCH: 22.7 pg — AB (ref 26.0–34.0)
MCHC: 30.3 g/dL (ref 30.0–36.0)
MCV: 74.9 fL — AB (ref 78.0–100.0)
Platelets: 364 10*3/uL (ref 150–400)
RBC: 4.58 MIL/uL (ref 3.87–5.11)
RDW: 17.7 % — ABNORMAL HIGH (ref 11.5–15.5)
WBC: 9.9 10*3/uL (ref 4.0–10.5)

## 2016-02-15 LAB — I-STAT TROPONIN, ED
TROPONIN I, POC: 0 ng/mL (ref 0.00–0.08)
TROPONIN I, POC: 0 ng/mL (ref 0.00–0.08)
Troponin i, poc: 0 ng/mL (ref 0.00–0.08)

## 2016-02-15 LAB — BASIC METABOLIC PANEL
Anion gap: 14 (ref 5–15)
BUN: 14 mg/dL (ref 6–20)
CALCIUM: 9.8 mg/dL (ref 8.9–10.3)
CO2: 20 mmol/L — AB (ref 22–32)
CREATININE: 0.85 mg/dL (ref 0.44–1.00)
Chloride: 106 mmol/L (ref 101–111)
GFR calc non Af Amer: >60 mL/min (ref >60)
Glucose, Bld: 111 mg/dL — ABNORMAL HIGH (ref 65–99)
Potassium: 4.1 mmol/L (ref 3.5–5.1)
Sodium: 140 mmol/L (ref 135–145)

## 2016-02-15 NOTE — ED Notes (Signed)
Presents with left arm pain began 2/21, pain is running from shoulder to elbow. Left sided chest pain began this evening while lying down , pain described as tightening. Denies SOB, nausea. Endorses feeling light headed. Pt is alert and oriented. Pain is chest is rated 6/10 and pain the arm is constant and described as sharp.

## 2016-02-15 NOTE — Discharge Instructions (Signed)
Restart your  Aspirin daily today please  If you have increased chest pain / shortness of breath or other concerning or changing symptoms, please return to the ER immediately for recheck  Call the heart doctor today - you MUST be seen in next week for recheck as you may need a stress test

## 2016-02-15 NOTE — ED Notes (Signed)
Pt ambulated to restroom. 

## 2016-02-15 NOTE — ED Provider Notes (Signed)
CSN: [id number]     Arrival date & time 02/15/16  0051 History   First MD Initiated Contact with Patient 02/15/16 [phone]     Chief Complaint  Patient presents with  . Chest Pain     (Consider location/radiation/quality/duration/timing/severity/associated sxs/prior Treatment) HPI Comments: 60 year old female, she has a history of hypertension, diabetes, hypercholesterolemia, she has a prior history of smoking, she reports that she has had a negative stress test within the last 7 or 8 years but she cannot remember the date. She reports that last night and all day yesterday she was having pain in her left arm between the elbow and the shoulder, she is unsure exactly what makes this better or worse but states that it is overall very constant. In addition to that at about midnight she developed a central chest abnormal feeling which is poorly described, as occurred while she was laying down, currently she is having ongoing pain in her chest, it is sharp, she denies any associated fevers or coughing, she occasionally feels short of breath chronically and states this may be related to prior lung disease when she was younger. She has no risk factors for pulmonary embolism, she has not had surgery, travel, trauma, injury, swelling of the legs. She denies a history of dyspnea or pain on exertion.  She has been taking her antihypertensive and diabetic medications as prescribed, she has not been taking aspirin stating that she ran out a while ago and has not taken it in over a month  Patient is a 60 y.o. female presenting with chest pain. The history is provided by the patient.  Chest Pain   Past Medical History  Diagnosis Date  . Hypertension   . Diabetes mellitus   . Epilepsy (HCC)   . S/P cholecystectomy   . H/O: hysterectomy   . Hyperlipidemia   . Gout    Past Surgical History  Procedure Laterality Date  . Cholecystectomy    . Abdominal hysterectomy     Family History  Problem Relation Age of  Onset  . Diabetes Mother   . Hypertension Mother   . Diabetes Father    Social History  Substance Use Topics  . Smoking status: Former Smoker    Types: Cigarettes  . Smokeless tobacco: Never Used  . Alcohol Use: Yes   OB History    No data available     Review of Systems  Cardiovascular: Positive for chest pain.  All other systems reviewed and are negative.     Allergies  Penicillins  Home Medications   Prior to Admission medications   Medication Sig Start Date End Date Taking? Authorizing Provider  allopurinol (ZYLOPRIM) 100 MG tablet Take 100 mg by mouth daily.   Yes Historical Provider, MD  aspirin 81 MG chewable tablet Chew 81 mg by mouth daily.   Yes Historical Provider, MD  atorvastatin (LIPITOR) 80 MG tablet Take 80 mg by mouth daily. 06/26/15  Yes Historical Provider, MD  carbamazepine (CARBATROL) 200 MG 12 hr capsule Take 200 mg by mouth daily. 08/19/15  Yes Historical Provider, MD  dapagliflozin propanediol (FARXIGA) 5 MG TABS tablet Take 5 mg by mouth daily.   Yes Historical Provider, MD  lisinopril (PRINIVIL,ZESTRIL) 40 MG tablet Take 40 mg by mouth daily. 07/05/15  Yes Historical Provider, MD  metoprolol tartrate (LOPRESSOR) 25 MG tablet Take 25 mg by mouth 2 (two) times daily.   Yes Historical Provider, MD  SitaGLIPtin-MetFORMIN HCl (JANUMET XR) [phone] MG TB24 Take 1 tablet by  mouth daily with supper.   Yes Historical Provider, MD  HYDROcodone-acetaminophen (NORCO) 5-325 MG per tablet Take 1-2 tablets by mouth every 6 (six) hours as needed. Patient not taking: Reported on 02/15/2016 09/03/15   Geoffery Lyons, MD  indomethacin (INDOCIN) 25 MG capsule Take 1-2 capsules (25-50 mg total) by mouth 3 (three) times daily as needed for mild pain or moderate pain. Patient taking differently: Take 25-50 mg by mouth 3 (three) times daily as needed for mild pain or moderate pain. For gout pain 01/22/15   Renne Crigler, PA-C  oxyCODONE-acetaminophen (PERCOCET/ROXICET) 5-325 MG per  tablet Take 1-2 tablets by mouth every 6 (six) hours as needed for severe pain. Patient not taking: Reported on 09/03/2015 01/22/15   Renne Crigler, PA-C   BP 148/89 mmHg  Pulse 78  Temp(Src) 98.7 F (37.1 C) (Oral)  Resp 12  Ht  (1.575 m)  Wt 192 lb (87.091 kg)  BMI 35.11 kg/m2  SpO2 99% Physical Exam  Constitutional: She appears well-developed and well-nourished. No distress.  HENT:  Head: Normocephalic and atraumatic.  Mouth/Throat: Oropharynx is clear and moist. No oropharyngeal exudate.  Eyes: Conjunctivae and EOM are normal. Pupils are equal, round, and reactive to light. Right eye exhibits no discharge. Left eye exhibits no discharge. No scleral icterus.  Neck: Normal range of motion. Neck supple. No JVD present. No thyromegaly present.  Cardiovascular: Normal rate, regular rhythm, normal heart sounds and intact distal pulses.  Exam reveals no gallop and no friction rub.   No murmur heard. Pulmonary/Chest: Effort normal and breath sounds normal. No respiratory distress. She has no wheezes. She has no rales.  Abdominal: Soft. Bowel sounds are normal. She exhibits no distension and no mass. There is no tenderness.  Musculoskeletal: Normal range of motion. She exhibits tenderness ( Mild reproducible tenderness with range of motion of the left arm, no obvious deformity, no tenderness of the compartment, no overlying redness swelling or warmth, normal pulses at the bilateral radial arteries). She exhibits no edema.  Lymphadenopathy:    She has no cervical adenopathy.  Neurological: She is alert. Coordination normal.  Skin: Skin is warm and dry. No rash noted. No erythema.  Psychiatric: She has a normal mood and affect. Her behavior is normal.  Nursing note and vitals reviewed.   ED Course  Procedures (including critical care time) Labs Review Labs Reviewed  BASIC METABOLIC PANEL - Abnormal; Notable for the following:    CO2 20 (*)    Glucose, Bld 111 (*)    All other  components within normal limits  CBC - Abnormal; Notable for the following:    Hemoglobin 10.4 (*)    HCT 34.3 (*)    MCV 74.9 (*)    MCH 22.7 (*)    RDW 17.7 (*)    All other components within normal limits  I-STAT TROPOININ, ED  I-STAT TROPOININ, ED  Rosezena Sensor, ED    Imaging Review Dg Chest 2 View  02/15/2016  CLINICAL DATA:  Mid chest pain and pain radiating down left arm today. EXAM: CHEST  2 VIEW COMPARISON:  09/03/2015 FINDINGS: The cardiomediastinal contours are normal. The lungs are clear. Pulmonary vasculature is normal. No consolidation, pleural effusion, or pneumothorax. No acute osseous abnormalities are seen. IMPRESSION: No acute pulmonary process. Electronically Signed   By: Rubye Oaks M.D.   On: 02/15/2016 01:57   I have personally reviewed and evaluated these images and lab results as part of my medical decision-making.   EKG Interpretation  Date/Time:  Wednesday February 15 2016 00:52:11 EST Ventricular Rate:  81 PR Interval:  154 QRS Duration: 96 QT Interval:  378 QTC Calculation: 439 R Axis:   -24 Text Interpretation:  Normal sinus rhythm Minimal voltage criteria for  LVH, may be normal variant Borderline ECG No significant change was found  Confirmed by CAMPOS  MD, KEVIN ([id number]) on 02/15/2016 5:13:40 AM      EKG Interpretation  Date/Time:  Wednesday February 15 2016 10:18:25 EST Ventricular Rate:  71 PR Interval:  169 QRS Duration: 106 QT Interval:  418 QTC Calculation: 454 R Axis:   -2 Text Interpretation:  Sinus rhythm Left anterior fasicular block since last tracing no significant change Confirmed by Sajan Cheatwood  MD, Jumanah Hynson ([id number]) on 02/15/2016 10:31:08 AM        MDM   Final diagnoses:  Chest pain, unspecified chest pain type    The patient seems to have a normal cardiac and pulmonary exam, her heart and lung sounds are normal, her chest x-ray was normal, her EKG is totally unchanged from prior, there are no signs of ST elevation or  depression, she has had 2 normal troponins over the last 6 hours prior to my evaluation. Her vital signs show mild hypertension, she does not appear to be in any distress. I will obtain a third troponin, the patient has had chest pain now for over 7 hours, she will definitely need to follow-up with the cardiologist, will provide her a dose of aspirin and I strongly encouraged her to continue aspirin use at home.  HEART score of 3  3 trop neg czriology f/u given Pt in agreement and requesting d/c.  Eber Hong, MD 02/15/16 1031

## 2016-05-09 ENCOUNTER — Ambulatory Visit: Payer: Medicare Other | Admitting: Podiatry

## 2016-10-24 ENCOUNTER — Ambulatory Visit: Payer: Medicare Other | Admitting: Podiatry

## 2016-11-14 ENCOUNTER — Encounter (INDEPENDENT_AMBULATORY_CARE_PROVIDER_SITE_OTHER): Payer: Medicare Other | Admitting: Ophthalmology

## 2017-01-18 ENCOUNTER — Other Ambulatory Visit: Payer: Self-pay | Admitting: Internal Medicine

## 2017-01-18 ENCOUNTER — Ambulatory Visit
Admission: RE | Admit: 2017-01-18 | Discharge: 2017-01-18 | Disposition: A | Discharge status: Home / Self Care | Payer: Medicare Other | Source: Ambulatory Visit | Attending: Internal Medicine | Admitting: Internal Medicine

## 2017-01-18 DIAGNOSIS — M25521 Pain in right elbow: Secondary | ICD-10-CM

## 2017-01-19 ENCOUNTER — Encounter (HOSPITAL_COMMUNITY): Payer: Self-pay | Admitting: Emergency Medicine

## 2017-01-19 ENCOUNTER — Observation Stay (HOSPITAL_COMMUNITY)
Admission: EM | Admit: 2017-01-19 | Discharge: 2017-01-20 | Disposition: A | Discharge status: Home / Self Care | Payer: Medicare Other | Attending: Internal Medicine | Admitting: Internal Medicine

## 2017-01-19 ENCOUNTER — Emergency Department (HOSPITAL_COMMUNITY): Payer: Medicare Other

## 2017-01-19 DIAGNOSIS — R112 Nausea with vomiting, unspecified: Secondary | ICD-10-CM | POA: Diagnosis not present

## 2017-01-19 DIAGNOSIS — Z7984 Long term (current) use of oral hypoglycemic drugs: Secondary | ICD-10-CM | POA: Diagnosis not present

## 2017-01-19 DIAGNOSIS — R0789 Other chest pain: Principal | ICD-10-CM | POA: Insufficient documentation

## 2017-01-19 DIAGNOSIS — G40909 Epilepsy, unspecified, not intractable, without status epilepticus: Secondary | ICD-10-CM | POA: Insufficient documentation

## 2017-01-19 DIAGNOSIS — I10 Essential (primary) hypertension: Secondary | ICD-10-CM | POA: Diagnosis not present

## 2017-01-19 DIAGNOSIS — E1165 Type 2 diabetes mellitus with hyperglycemia: Secondary | ICD-10-CM | POA: Diagnosis not present

## 2017-01-19 DIAGNOSIS — M109 Gout, unspecified: Secondary | ICD-10-CM | POA: Diagnosis present

## 2017-01-19 DIAGNOSIS — Z833 Family history of diabetes mellitus: Secondary | ICD-10-CM | POA: Insufficient documentation

## 2017-01-19 DIAGNOSIS — Z6836 Body mass index (BMI) 36.0-36.9, adult: Secondary | ICD-10-CM | POA: Diagnosis not present

## 2017-01-19 DIAGNOSIS — Z880 Allergy status to penicillin: Secondary | ICD-10-CM | POA: Insufficient documentation

## 2017-01-19 DIAGNOSIS — E119 Type 2 diabetes mellitus without complications: Secondary | ICD-10-CM

## 2017-01-19 DIAGNOSIS — D638 Anemia in other chronic diseases classified elsewhere: Secondary | ICD-10-CM | POA: Insufficient documentation

## 2017-01-19 DIAGNOSIS — R569 Unspecified convulsions: Secondary | ICD-10-CM

## 2017-01-19 DIAGNOSIS — E782 Mixed hyperlipidemia: Secondary | ICD-10-CM

## 2017-01-19 DIAGNOSIS — E669 Obesity, unspecified: Secondary | ICD-10-CM | POA: Diagnosis not present

## 2017-01-19 DIAGNOSIS — K219 Gastro-esophageal reflux disease without esophagitis: Secondary | ICD-10-CM | POA: Diagnosis not present

## 2017-01-19 DIAGNOSIS — E785 Hyperlipidemia, unspecified: Secondary | ICD-10-CM

## 2017-01-19 DIAGNOSIS — R079 Chest pain, unspecified: Secondary | ICD-10-CM

## 2017-01-19 DIAGNOSIS — Z87891 Personal history of nicotine dependence: Secondary | ICD-10-CM | POA: Diagnosis not present

## 2017-01-19 DIAGNOSIS — Z8249 Family history of ischemic heart disease and other diseases of the circulatory system: Secondary | ICD-10-CM | POA: Insufficient documentation

## 2017-01-19 DIAGNOSIS — Z7982 Long term (current) use of aspirin: Secondary | ICD-10-CM | POA: Diagnosis not present

## 2017-01-19 LAB — LIPID PANEL
CHOL/HDL RATIO: 3.3 ratio
Cholesterol: 169 mg/dL (ref 0–200)
HDL: 52 mg/dL (ref >40)
LDL CALC: 93 mg/dL (ref 0–99)
Triglycerides: 120 mg/dL (ref <150)
VLDL: 24 mg/dL (ref 0–40)

## 2017-01-19 LAB — BASIC METABOLIC PANEL
ANION GAP: 12 (ref 5–15)
BUN: 14 mg/dL (ref 6–20)
CHLORIDE: 106 mmol/L (ref 101–111)
CO2: 21 mmol/L — AB (ref 22–32)
Calcium: 9.5 mg/dL (ref 8.9–10.3)
Creatinine, Ser: 0.74 mg/dL (ref 0.44–1.00)
GFR calc Af Amer: >60 mL/min (ref >60)
GLUCOSE: 144 mg/dL — AB (ref 65–99)
POTASSIUM: 4 mmol/L (ref 3.5–5.1)
Sodium: 139 mmol/L (ref 135–145)

## 2017-01-19 LAB — CBC
HEMATOCRIT: 34 % — AB (ref 36.0–46.0)
HEMOGLOBIN: 10.5 g/dL — AB (ref 12.0–15.0)
MCH: 23.3 pg — ABNORMAL LOW (ref 26.0–34.0)
MCHC: 30.9 g/dL (ref 30.0–36.0)
MCV: 75.4 fL — ABNORMAL LOW (ref 78.0–100.0)
Platelets: 318 10*3/uL (ref 150–400)
RBC: 4.51 MIL/uL (ref 3.87–5.11)
RDW: 17.3 % — ABNORMAL HIGH (ref 11.5–15.5)
WBC: 6.2 10*3/uL (ref 4.0–10.5)

## 2017-01-19 LAB — HEPATIC FUNCTION PANEL
ALBUMIN: 3.5 g/dL (ref 3.5–5.0)
ALT: 19 U/L (ref 14–54)
AST: 25 U/L (ref 15–41)
Alkaline Phosphatase: 110 U/L (ref 38–126)
BILIRUBIN TOTAL: 0.3 mg/dL (ref 0.3–1.2)
Bilirubin, Direct: <0.1 mg/dL — ABNORMAL LOW (ref 0.1–0.5)
Total Protein: 7.1 g/dL (ref 6.5–8.1)

## 2017-01-19 LAB — FERRITIN: Ferritin: 8 ng/mL — ABNORMAL LOW (ref 11–307)

## 2017-01-19 LAB — RETICULOCYTES
RBC.: 4.13 MIL/uL (ref 3.87–5.11)
RETIC COUNT ABSOLUTE: 49.6 10*3/uL (ref 19.0–186.0)
Retic Ct Pct: 1.2 % (ref 0.4–3.1)

## 2017-01-19 LAB — FOLATE: FOLATE: 9.6 ng/mL (ref >5.9)

## 2017-01-19 LAB — IRON AND TIBC
Iron: 30 ug/dL (ref 28–170)
Saturation Ratios: 9 % — ABNORMAL LOW (ref 10.4–31.8)
TIBC: 351 ug/dL (ref 250–450)
UIBC: 321 ug/dL

## 2017-01-19 LAB — GLUCOSE, CAPILLARY
GLUCOSE-CAPILLARY: 176 mg/dL — AB (ref 65–99)
Glucose-Capillary: 149 mg/dL — ABNORMAL HIGH (ref 65–99)
Glucose-Capillary: 182 mg/dL — ABNORMAL HIGH (ref 65–99)

## 2017-01-19 LAB — TROPONIN I: Troponin I: <0.03 ng/mL (ref <0.03)

## 2017-01-19 LAB — VITAMIN B12: VITAMIN B 12: 164 pg/mL — AB (ref 180–914)

## 2017-01-19 LAB — I-STAT TROPONIN, ED: Troponin i, poc: 0 ng/mL (ref 0.00–0.08)

## 2017-01-19 LAB — LIPASE, BLOOD: Lipase: 22 U/L (ref 11–51)

## 2017-01-19 MED ORDER — ASPIRIN 81 MG PO CHEW
324.0000 mg | CHEWABLE_TABLET | Freq: Once | ORAL | Status: AC
  Administered 2017-01-19: 324 mg via ORAL
  Filled 2017-01-19: qty 4

## 2017-01-19 MED ORDER — ASPIRIN EC 81 MG PO TBEC
81.0000 mg | DELAYED_RELEASE_TABLET | Freq: Every day | ORAL | Status: DC
  Administered 2017-01-20: 81 mg via ORAL
  Filled 2017-01-19 (×2): qty 1

## 2017-01-19 MED ORDER — LISINOPRIL 20 MG PO TABS
40.0000 mg | ORAL_TABLET | Freq: Every day | ORAL | Status: DC
  Administered 2017-01-20: 40 mg via ORAL
  Filled 2017-01-19: qty 2
  Filled 2017-01-19: qty 4

## 2017-01-19 MED ORDER — METOPROLOL TARTRATE 25 MG PO TABS
25.0000 mg | ORAL_TABLET | Freq: Once | ORAL | Status: AC
  Administered 2017-01-19: 25 mg via ORAL
  Filled 2017-01-19: qty 1

## 2017-01-19 MED ORDER — METOPROLOL TARTRATE 25 MG PO TABS
25.0000 mg | ORAL_TABLET | Freq: Every day | ORAL | Status: DC
  Administered 2017-01-20: 25 mg via ORAL
  Filled 2017-01-19: qty 1

## 2017-01-19 MED ORDER — AMLODIPINE BESYLATE 5 MG PO TABS
2.5000 mg | ORAL_TABLET | Freq: Once | ORAL | Status: AC
  Administered 2017-01-20: 2.5 mg via ORAL
  Filled 2017-01-19: qty 1

## 2017-01-19 MED ORDER — ATORVASTATIN CALCIUM 80 MG PO TABS
80.0000 mg | ORAL_TABLET | Freq: Every day | ORAL | Status: DC
  Administered 2017-01-19 – 2017-01-20 (×2): 80 mg via ORAL
  Filled 2017-01-19 (×2): qty 1

## 2017-01-19 MED ORDER — EZETIMIBE 10 MG PO TABS
10.0000 mg | ORAL_TABLET | Freq: Every day | ORAL | Status: DC
  Administered 2017-01-19 – 2017-01-20 (×2): 10 mg via ORAL
  Filled 2017-01-19 (×2): qty 1

## 2017-01-19 MED ORDER — ASPIRIN EC 81 MG PO TBEC: 81.0000 mg | DELAYED_RELEASE_TABLET | Freq: Every day | ORAL | Status: DC

## 2017-01-19 MED ORDER — ACETAMINOPHEN 325 MG PO TABS
650.0000 mg | ORAL_TABLET | ORAL | Status: DC | PRN
  Administered 2017-01-19 (×2): 650 mg via ORAL
  Filled 2017-01-19: qty 2

## 2017-01-19 MED ORDER — GI COCKTAIL ~~LOC~~
30.0000 mL | Freq: Four times a day (QID) | ORAL | Status: DC | PRN
Start: 2017-01-19 — End: 2017-01-20

## 2017-01-19 MED ORDER — CARBAMAZEPINE ER 200 MG PO TB12
200.0000 mg | ORAL_TABLET | Freq: Every day | ORAL | Status: DC
  Administered 2017-01-19 – 2017-01-20 (×2): 200 mg via ORAL
  Filled 2017-01-19 (×3): qty 1

## 2017-01-19 MED ORDER — LISINOPRIL 20 MG PO TABS
40.0000 mg | ORAL_TABLET | Freq: Once | ORAL | Status: AC
  Administered 2017-01-19: 40 mg via ORAL
  Filled 2017-01-19: qty 2

## 2017-01-19 MED ORDER — MORPHINE SULFATE (PF) 4 MG/ML IV SOLN: 2.0000 mg | INTRAVENOUS | Status: DC | PRN

## 2017-01-19 MED ORDER — PANTOPRAZOLE SODIUM 40 MG IV SOLR
40.0000 mg | INTRAVENOUS | Status: DC
Start: 2017-01-19 — End: 2017-01-19
  Filled 2017-01-19: qty 40

## 2017-01-19 MED ORDER — ONDANSETRON HCL 4 MG/2ML IJ SOLN: 4.0000 mg | Freq: Four times a day (QID) | INTRAMUSCULAR | Status: DC | PRN

## 2017-01-19 MED ORDER — GI COCKTAIL ~~LOC~~
30.0000 mL | Freq: Once | ORAL | Status: AC
  Administered 2017-01-19: 30 mL via ORAL
  Filled 2017-01-19: qty 30

## 2017-01-19 MED ORDER — NITROGLYCERIN 2 % TD OINT
0.5000 [in_us] | TOPICAL_OINTMENT | Freq: Once | TRANSDERMAL | Status: AC
  Administered 2017-01-19: 0.5 [in_us] via TOPICAL
  Filled 2017-01-19: qty 1

## 2017-01-19 MED ORDER — PANTOPRAZOLE SODIUM 40 MG PO TBEC
40.0000 mg | DELAYED_RELEASE_TABLET | Freq: Every day | ORAL | Status: DC
  Administered 2017-01-19 – 2017-01-20 (×2): 40 mg via ORAL
  Filled 2017-01-19 (×2): qty 1

## 2017-01-19 MED ORDER — AMLODIPINE BESYLATE 5 MG PO TABS
2.5000 mg | ORAL_TABLET | Freq: Once | ORAL | Status: AC
  Administered 2017-01-19: 2.5 mg via ORAL
  Filled 2017-01-19: qty 1

## 2017-01-19 MED ORDER — ALLOPURINOL 100 MG PO TABS
100.0000 mg | ORAL_TABLET | Freq: Every day | ORAL | Status: DC
  Administered 2017-01-19 – 2017-01-20 (×2): 100 mg via ORAL
  Filled 2017-01-19 (×2): qty 1

## 2017-01-19 MED ORDER — ENOXAPARIN SODIUM 40 MG/0.4ML ~~LOC~~ SOLN
40.0000 mg | SUBCUTANEOUS | Status: DC
  Administered 2017-01-19 – 2017-01-20 (×2): 40 mg via SUBCUTANEOUS
  Filled 2017-01-19 (×2): qty 0.4

## 2017-01-19 MED ORDER — INSULIN ASPART 100 UNIT/ML ~~LOC~~ SOLN
0.0000 [IU] | Freq: Three times a day (TID) | SUBCUTANEOUS | Status: DC
  Administered 2017-01-19: 2 [IU] via SUBCUTANEOUS
  Administered 2017-01-19: 1 [IU] via SUBCUTANEOUS
  Administered 2017-01-20: 3 [IU] via SUBCUTANEOUS
  Administered 2017-01-20: 2 [IU] via SUBCUTANEOUS

## 2017-01-19 MED ORDER — SUCRALFATE 1 G PO TABS
1.0000 g | ORAL_TABLET | Freq: Once | ORAL | Status: AC
  Administered 2017-01-19: 1 g via ORAL
  Filled 2017-01-19: qty 1

## 2017-01-19 NOTE — Progress Notes (Signed)
Pt received from ED. Pt oriented to room and equipment. VSS. Telemetry applied, CCMD notified x2. Pt describes mild headache - PRN tylenol already given in ED - pt verbalized understanding. Pt describes indigestion/acid reflux - will give ordered protonix. Pt describes chest pain as epigastric, intermittent with reflux, and a 2/10. Call bell within reach, will continue to monitor.   Leonidas Rombergaitlin S Bumbledare, RN

## 2017-01-19 NOTE — Progress Notes (Signed)
Spoke with attending physician. Given verbal order to discontinue nitroglycerin patch. Pt describes acid reflux/indigestion intermittenly, but not chest pain. Will continue to monitor. Pt educated to inform staff of any changes. Call bell within reach.  Leonidas Rombergaitlin S Bumbledare, RN

## 2017-01-19 NOTE — ED Notes (Signed)
Pt states she was awakened by pain in her chest. Pt started to have these pains last night and she thought it was heartburn. Pt then went to bed and was awakened with chest pains at 0415.

## 2017-01-19 NOTE — ED Notes (Signed)
Spoke with admitting PA and made aware pt still reports 2/10 CP and has been assigned to 2 W. Reports okay for patient to go to 2W, will be consulting cardiology.

## 2017-01-19 NOTE — Consult Note (Signed)
CARDIOLOGY CONSULT NOTE  Patient ID: Dawn Rice MRN: [id number] DOB/AGE: [birth year] 61 y.o.  Admit date: 01/19/2017 Primary Physician: Gwynneth Aliment, MD Referring Physician:   Reason for Consultation: chest pain  HPI: 61 yr old woman with PMH significant for diabetes, anemia of chronic disease, hypertension, obesity, hyperlipidemia, and tobacco abuse presented this morning with chest pain. She had multiple episodes of emesis (no hematemesis) and epigastric discomfort as well, with some alleviation of symptoms with belching. Denies shortness of breath, diaphoresis, orthopnea, and PND.  Symptoms began while sitting down at 6 pm yesterday. After having chest and epigastric pressure, she belched to alleviate symptoms which helped but eventually she vomited.  She then went to bed and was awoken by the same chest pressure.  Denies exertional chest pain. Has occasional exertional dyspnea when climbing stairs. Quit smoking 9 yrs ago.  Does feel progressive decline in energy levels over past 6-12 months.  Denies abdominal pain, dysphagia, and currently has an appetite.  ECG showed normal sinus rhythm with late R wave transition.  Chest xray showed no active cardiopulmonary disease.  Nuclear stress test 01/28/2008 showed no stress induced ischemia with inferior wall hypokinesis, apical thinning, LVEF 72%.  Troponin I normal x 2.  Lipids today: TC 214, HDL 57, LDL 131, TG 131.  Hgb 10.5.  Was given ASA, GI cocktail, and carafate. Nitroglycerin brought pain to 2/10.       Current Facility-Administered Medications  Medication Dose Route Frequency Provider Last Rate Last Dose  . acetaminophen (TYLENOL) tablet 650 mg  650 mg Oral Q4H PRN Marcos Eke, PA-C   650 mg at 01/19/17 0915  . allopurinol (ZYLOPRIM) tablet 100 mg  100 mg Oral Daily Marcos Eke, PA-C   100 mg at 01/19/17 1048  . [START ON 01/20/2017] amLODipine (NORVASC) tablet 2.5 mg  2.5 mg Oral Once  Marcos Eke, PA-C      . aspirin EC tablet 81 mg  81 mg Oral Daily Hillis Range, RPH      . atorvastatin (LIPITOR) tablet 80 mg  80 mg Oral Daily Marcos Eke, PA-C   80 mg at 01/19/17 1048  . carbamazepine (TEGRETOL XR) 12 hr tablet 200 mg  200 mg Oral Daily Marcos Eke, PA-C   200 mg at 01/19/17 1049  . enoxaparin (LOVENOX) injection 40 mg  40 mg Subcutaneous Q24H Marcos Eke, PA-C   40 mg at 01/19/17 1050  . gi cocktail (Maalox,Lidocaine,Donnatal)  30 mL Oral QID PRN Marcos Eke, PA-C      . insulin aspart (novoLOG) injection 0-9 Units  0-9 Units Subcutaneous TID WC Marcos Eke, PA-C   2 Units at 01/19/17 1151  . [START ON 01/20/2017] lisinopril (PRINIVIL,ZESTRIL) tablet 40 mg  40 mg Oral Daily Marcos Eke, PA-C      . [START ON 01/20/2017] metoprolol tartrate (LOPRESSOR) tablet 25 mg  25 mg Oral Daily Marcos Eke, PA-C      . morphine 4 MG/ML injection 2 mg  2 mg Intravenous Q2H PRN Marcos Eke, PA-C      . ondansetron [facility]) injection 4 mg  4 mg Intravenous Q6H PRN Marcos Eke, PA-C      . pantoprazole (PROTONIX) EC tablet 40 mg  40 mg Oral Daily Pete Glatter, MD   40 mg at 01/19/17 1048    Past Medical History:  Diagnosis Date  . Diabetes mellitus   .  Epilepsy (HCC)   . Gout   . H/O: hysterectomy   . Hyperlipidemia   . Hypertension   . S/P cholecystectomy     Past Surgical History:  Procedure Laterality Date  . ABDOMINAL HYSTERECTOMY    . CHOLECYSTECTOMY      Social History   Social History  . Marital status: Widowed    Spouse name: N/A  . Number of children: N/A  . Years of education: N/A   Occupational History  . Not on file.   Social History Main Topics  . Smoking status: Former Smoker    Types: Cigarettes  . Smokeless tobacco: Never Used  . Alcohol use Yes  . Drug use: No  . Sexual activity: Yes   Other Topics Concern  . Not on file   Social History Narrative  . No narrative on file     No family history of  premature CAD in 1st degree relatives.  Prior to Admission medications   Medication Sig Start Date End Date Taking? Authorizing Provider  allopurinol (ZYLOPRIM) 100 MG tablet Take 100 mg by mouth daily.   Yes Historical Provider, MD  amLODipine (NORVASC) 2.5 MG tablet Take 2.5 mg by mouth daily. 10/23/16  Yes Historical Provider, MD  atorvastatin (LIPITOR) 80 MG tablet Take 80 mg by mouth daily. 06/26/15  Yes Historical Provider, MD  carbamazepine (CARBATROL) 200 MG 12 hr capsule Take 200 mg by mouth daily. 08/19/15  Yes Historical Provider, MD  dapagliflozin propanediol (FARXIGA) 5 MG TABS tablet Take 5 mg by mouth daily.   Yes Historical Provider, MD  lisinopril (PRINIVIL,ZESTRIL) 40 MG tablet Take 40 mg by mouth daily. 07/05/15  Yes Historical Provider, MD  metoprolol tartrate (LOPRESSOR) 25 MG tablet Take 25 mg by mouth 2 (two) times daily.   Yes Historical Provider, MD  SitaGLIPtin-MetFORMIN HCl (JANUMET XR) [phone] MG TB24 Take 1 tablet by mouth daily with supper.   Yes Historical Provider, MD     Review of systems complete and found to be negative unless listed above in HPI     Physical exam Blood pressure (!) 117/53, pulse 67, temperature 98.3 F (36.8 C), temperature source Oral, resp. rate 18, height 5\' 2"  (1.575 m), weight 197 lb (89.4 kg), SpO2 98 %. General: NAD Neck: No JVD, no thyromegaly or thyroid nodule.  Lungs: Clear to auscultation bilaterally with normal respiratory effort. CV: Nondisplaced PMI. Regular rate and rhythm, normal S1/S2, no S3/S4, no murmur.  No peripheral edema.  No carotid bruit.  Normal pedal pulses.  Abdomen: Soft, nontender, no hepatosplenomegaly, no distention.  Skin: Intact without lesions or rashes.  Neurologic: Alert and oriented x 3.  Psych: Normal affect. Extremities: No clubbing or cyanosis.  HEENT: Normal.   ECG: Most recent ECG reviewed.  Labs:   Lab Results  Component Value Date   WBC 6.2 01/19/2017   HGB 10.5 (L) 01/19/2017    HCT 34.0 (L) 01/19/2017   MCV 75.4 (L) 01/19/2017   PLT 318 01/19/2017    Recent Labs Lab 01/19/17 0503  NA 139  K 4.0  CL 106  CO2 21*  BUN 14  CREATININE 0.74  CALCIUM 9.5  PROT 7.1  BILITOT 0.3  ALKPHOS 110  ALT 19  AST 25  GLUCOSE 144*   Lab Results  Component Value Date   CKTOTAL 102 07/22/2010   CKMB 1.6 07/22/2010   TROPONINI <0.03 01/19/2017    Lab Results  Component Value Date   CHOL 169 01/19/2017   CHOL 214 (  H) 01/22/2011   CHOL 134 01/16/2010   Lab Results  Component Value Date   HDL 52 01/19/2017   HDL 57 01/22/2011   HDL 45 01/16/2010   Lab Results  Component Value Date   LDLCALC 93 01/19/2017   LDLCALC 131 (H) 01/22/2011   LDLCALC 70 01/16/2010   Lab Results  Component Value Date   TRIG 120 01/19/2017   TRIG 131 01/22/2011   TRIG 95 01/16/2010   Lab Results  Component Value Date   CHOLHDL 3.3 01/19/2017   CHOLHDL 3.8 Ratio 01/22/2011   CHOLHDL 3.0 Ratio 01/16/2010   No results found for: LDLDIRECT       Studies: Dg Chest 2 View  Result Date: 01/19/2017 CLINICAL DATA:  61 year old female with chest pain. EXAM: CHEST  2 VIEW COMPARISON:  Chest radiograph dated 02/15/2016 FINDINGS: The heart size and mediastinal contours are within normal limits. Both lungs are clear. The visualized skeletal structures are unremarkable. Right upper quadrant cholecystectomy clips. IMPRESSION: No active cardiopulmonary disease. Electronically Signed   By: Elgie Collard M.D.   On: 01/19/2017 05:59   Dg Elbow Complete Right (3+view)  Result Date: 01/18/2017 CLINICAL DATA:  Knot all of the right elbow which appear 2 months ago with soreness and pain. No known injury. EXAM: RIGHT ELBOW - COMPLETE 3+ VIEW COMPARISON:  None. FINDINGS: A marker is placed over the region of concern along the lateral aspect of the elbow at the level of the metaphysis of the humerus. Underlying soft tissues and bony structures appear normal. There is no fracture dislocation. No  joint effusion. IMPRESSION: Negative examination. No abnormality is identified in the region of concern. Electronically Signed   By: Drusilla Kanner M.D.   On: 01/18/2017 16:59    ASSESSMENT AND PLAN:  1. Chest pain: Both typical and atypical symptoms for ischemic heart disease. However, has several CV risk factors. ECG and troponins unremarkable.  Will arrange for echocardiography to assess cardiac structure and function given inferior wall hypokinesis seen on stress testing in 2009. Will also arrange for Lexiscan Myoview stress test on 01/20/17. Continue ASA, Lipitor, and metoprolol.  2. Hypertension: Controlled on lisinopril and amlodipine. No changes.  3. Hyperlipidemia: TC and LDL remain elevated on Lipitor 80 mg. Will add Zetia 10 mg.    Signed: Prentice Docker, M.D., F.A.C.C.  01/19/2017, 3:06 PM

## 2017-01-19 NOTE — ED Triage Notes (Signed)
Pt began having what she thought was indigestion around 6pm Friday, she experienced one episode of emesis and was surprised to find that did not help her pain.  The pain woke her up out of a sleep around 4:15 this morning.  She also experienced chills. Hx of cardiac issues.

## 2017-01-19 NOTE — ED Provider Notes (Addendum)
[facility]-EMERGENCY DEPT Provider Note   CSN: [id number] Arrival date & time: 01/19/17  0446     History   Chief Complaint Chief Complaint  Patient presents with  . Chest Pain    HPI  Blood pressure (!) 169/102, pulse 74, temperature 97.8 F (36.6 C), temperature source Oral, resp. rate 15, SpO2 98 %.  Dawn Rice is a 61 y.o. female with past medical history significant for diabetes, hypertension, hyperlipidemia, obesity, former smoker complaining of a 6 out of 10 epigastric and lower midline chest pain onset at 6 PM last night with multiple episodes of nonbloody, nonbilious, non-coffee ground emesis. She states that she feels some relief with burping, she denies shortness of breath, diaphoresis, similar prior episodes, increasing peripheral edema, orthopnea, PND. She had a stress test 2009 which she reports is reassuring. She specifically take a daily low-dose aspirin but is noncompliant. Has FH of ACS In Father. Takes 3 blood pressure medications: Has not had them this morning.  Past Medical History:  Diagnosis Date  . Diabetes mellitus   . Epilepsy (HCC)   . Gout   . H/O: hysterectomy   . Hyperlipidemia   . Hypertension   . S/P cholecystectomy     Patient Active Problem List   Diagnosis Date Noted  . Chest pain 01/19/2017  . Hypertension 01/19/2017  . Hyperlipidemia 01/19/2017  . Diabetes (HCC) 01/19/2017  . Seizures (HCC) 01/19/2017  . Gout     Past Surgical History:  Procedure Laterality Date  . ABDOMINAL HYSTERECTOMY    . CHOLECYSTECTOMY      OB History    No data available       Home Medications    Prior to Admission medications   Medication Sig Start Date End Date Taking? Authorizing Provider  allopurinol (ZYLOPRIM) 100 MG tablet Take 100 mg by mouth daily.   Yes Historical Provider, MD  amLODipine (NORVASC) 2.5 MG tablet Take 2.5 mg by mouth daily. 10/23/16  Yes Historical Provider, MD  atorvastatin (LIPITOR) 80 MG tablet Take 80 mg by mouth  daily. 06/26/15  Yes Historical Provider, MD  carbamazepine (CARBATROL) 200 MG 12 hr capsule Take 200 mg by mouth daily. 08/19/15  Yes Historical Provider, MD  dapagliflozin propanediol (FARXIGA) 5 MG TABS tablet Take 5 mg by mouth daily.   Yes Historical Provider, MD  lisinopril (PRINIVIL,ZESTRIL) 40 MG tablet Take 40 mg by mouth daily. 07/05/15  Yes Historical Provider, MD  metoprolol tartrate (LOPRESSOR) 25 MG tablet Take 25 mg by mouth 2 (two) times daily.   Yes Historical Provider, MD  SitaGLIPtin-MetFORMIN HCl (JANUMET XR) [phone] MG TB24 Take 1 tablet by mouth daily with supper.   Yes Historical Provider, MD    Family History Family History  Problem Relation Age of Onset  . Diabetes Mother   . Hypertension Mother   . Heart failure Mother   . Diabetes Father   . Coronary artery disease Father     Social History Social History  Substance Use Topics  . Smoking status: Former Smoker    Types: Cigarettes  . Smokeless tobacco: Never Used  . Alcohol use Yes     Allergies   Penicillins   Review of Systems Review of Systems  10 systems reviewed and found to be negative, except as noted in the HPI.  Physical Exam Updated Vital Signs BP (!) 157/98 (BP Location: Left Arm)   Pulse 74   Temp 98.1 F (36.7 C) (Oral)   Resp 18   Ht 5'  2" (1.575 m)   Wt 89.4 kg   SpO2 100%   BMI 36.03 kg/m   Physical Exam  Constitutional: She is oriented to person, place, and time. She appears well-developed and well-nourished. No distress.  HENT:  Head: Normocephalic.  Mouth/Throat: Oropharynx is clear and moist.  Eyes: Conjunctivae are normal.  Neck: Normal range of motion. No JVD present. No tracheal deviation present.  Cardiovascular: Normal rate, regular rhythm and intact distal pulses.   Radial pulse equal bilaterally  Pulmonary/Chest: Effort normal and breath sounds normal. No stridor. No respiratory distress. She has no wheezes. She has no rales. She exhibits no tenderness.    Abdominal: Soft. She exhibits no distension and no mass. There is no tenderness. There is no rebound and no guarding.  Musculoskeletal: Normal range of motion. She exhibits no edema or tenderness.  No calf asymmetry, superficial collaterals, palpable cords, edema, Homans sign negative bilaterally.    Neurological: She is alert and oriented to person, place, and time.  Skin: Skin is warm. She is not diaphoretic.  Psychiatric: She has a normal mood and affect.  Nursing note and vitals reviewed.    ED Treatments / Results  Labs (all labs ordered are listed, but only abnormal results are displayed) Labs Reviewed  BASIC METABOLIC PANEL - Abnormal; Notable for the following:       Result Value   CO2 21 (*)    Glucose, Bld 144 (*)    All other components within normal limits  CBC - Abnormal; Notable for the following:    Hemoglobin 10.5 (*)    HCT 34.0 (*)    MCV 75.4 (*)    MCH 23.3 (*)    RDW 17.3 (*)    All other components within normal limits  HEPATIC FUNCTION PANEL - Abnormal; Notable for the following:    Bilirubin, Direct <0.1 (*)    All other components within normal limits  LIPASE, BLOOD  TROPONIN I  TROPONIN I  TROPONIN I  HEMOGLOBIN A1C  LIPID PANEL  VITAMIN B12  FOLATE  IRON AND TIBC  FERRITIN  RETICULOCYTES  I-STAT TROPOININ, ED    EKG  EKG Interpretation  Date/Time:  Saturday January 19 2017 04:54:04 EST Ventricular Rate:  92 PR Interval:  148 QRS Duration: 88 QT Interval:  366 QTC Calculation: 452 R Axis:   -10 Text Interpretation:  Normal sinus rhythm Anterior infarct , age undetermined Abnormal ECG When compared with ECG of 02/15/2016, No significant change was found Confirmed by [facility]  MD, DAVID ([id number]) on 01/19/2017 5:54:26 AM       Radiology Dg Chest 2 View  Result Date: 01/19/2017 CLINICAL DATA:  61 year old female with chest pain. EXAM: CHEST  2 VIEW COMPARISON:  Chest radiograph dated 02/15/2016 FINDINGS: The heart size and mediastinal  contours are within normal limits. Both lungs are clear. The visualized skeletal structures are unremarkable. Right upper quadrant cholecystectomy clips. IMPRESSION: No active cardiopulmonary disease. Electronically Signed   By: Elgie Collard M.D.   On: 01/19/2017 05:59   Dg Elbow Complete Right (3+view)  Result Date: 01/18/2017 CLINICAL DATA:  Knot all of the right elbow which appear 2 months ago with soreness and pain. No known injury. EXAM: RIGHT ELBOW - COMPLETE 3+ VIEW COMPARISON:  None. FINDINGS: A marker is placed over the region of concern along the lateral aspect of the elbow at the level of the metaphysis of the humerus. Underlying soft tissues and bony structures appear normal. There is no fracture dislocation. No  joint effusion. IMPRESSION: Negative examination. No abnormality is identified in the region of concern. Electronically Signed   By: Drusilla Kannerhomas  Dalessio M.D.   On: 01/18/2017 16:59    Procedures Procedures (including critical care time)  Medications Ordered in ED Medications  allopurinol (ZYLOPRIM) tablet 100 mg (not administered)  atorvastatin (LIPITOR) tablet 80 mg (not administered)  carbamazepine (TEGRETOL XR) 12 hr tablet 200 mg (not administered)  amLODipine (NORVASC) tablet 2.5 mg (not administered)  lisinopril (PRINIVIL,ZESTRIL) tablet 40 mg (not administered)  metoprolol tartrate (LOPRESSOR) tablet 25 mg (not administered)  morphine 4 MG/ML injection 2 mg (not administered)  gi cocktail (Maalox,Lidocaine,Donnatal) (not administered)  acetaminophen (TYLENOL) tablet 650 mg (650 mg Oral Given 01/19/17 0915)  ondansetron (ZOFRAN) injection 4 mg (not administered)  enoxaparin (LOVENOX) injection 40 mg (not administered)  insulin aspart (novoLOG) injection 0-9 Units (not administered)  pantoprazole (PROTONIX) injection 40 mg (not administered)  aspirin EC tablet 81 mg (not administered)  aspirin chewable tablet 324 mg (324 mg Oral Given 01/19/17 0653)  sucralfate  (CARAFATE) tablet 1 g (1 g Oral Given 01/19/17 0653)  nitroGLYCERIN (NITROGLYN) 2 % ointment 0.5 inch (0.5 inches Topical Given 01/19/17 0725)  amLODipine (NORVASC) tablet 2.5 mg (2.5 mg Oral Given 01/19/17 0723)  lisinopril (PRINIVIL,ZESTRIL) tablet 40 mg (40 mg Oral Given 01/19/17 0723)  metoprolol tartrate (LOPRESSOR) tablet 25 mg (25 mg Oral Given 01/19/17 0723)  gi cocktail (Maalox,Lidocaine,Donnatal) (30 mLs Oral Given 01/19/17 0839)     Initial Impression / Assessment and Plan / ED Course  I have reviewed the triage vital signs and the nursing notes.  Pertinent labs & imaging results that were available during my care of the patient were reviewed by me and considered in my medical decision making (see chart for details).     Vitals:   01/19/17 0800 01/19/17 0815 01/19/17 0830 01/19/17 0933  BP: 152/85 152/92 151/89 (!) 157/98  Pulse: 73 78 80 74  Resp: 16 15 18 18   Temp:    98.1 F (36.7 C)  TempSrc:    Oral  SpO2: 96% 97% 97% 100%  Weight:    89.4 kg  Height:    5\' 2"  (1.575 m)    Medications  allopurinol (ZYLOPRIM) tablet 100 mg (not administered)  atorvastatin (LIPITOR) tablet 80 mg (not administered)  carbamazepine (TEGRETOL XR) 12 hr tablet 200 mg (not administered)  amLODipine (NORVASC) tablet 2.5 mg (not administered)  lisinopril (PRINIVIL,ZESTRIL) tablet 40 mg (not administered)  metoprolol tartrate (LOPRESSOR) tablet 25 mg (not administered)  morphine 4 MG/ML injection 2 mg (not administered)  gi cocktail (Maalox,Lidocaine,Donnatal) (not administered)  acetaminophen (TYLENOL) tablet 650 mg (650 mg Oral Given 01/19/17 0915)  ondansetron (ZOFRAN) injection 4 mg (not administered)  enoxaparin (LOVENOX) injection 40 mg (not administered)  insulin aspart (novoLOG) injection 0-9 Units (not administered)  pantoprazole (PROTONIX) injection 40 mg (not administered)  aspirin EC tablet 81 mg (not administered)  aspirin chewable tablet 324 mg (324 mg Oral Given 01/19/17 0653)    sucralfate (CARAFATE) tablet 1 g (1 g Oral Given 01/19/17 0653)  nitroGLYCERIN (NITROGLYN) 2 % ointment 0.5 inch (0.5 inches Topical Given 01/19/17 0725)  amLODipine (NORVASC) tablet 2.5 mg (2.5 mg Oral Given 01/19/17 0723)  lisinopril (PRINIVIL,ZESTRIL) tablet 40 mg (40 mg Oral Given 01/19/17 0723)  metoprolol tartrate (LOPRESSOR) tablet 25 mg (25 mg Oral Given 01/19/17 0723)  gi cocktail (Maalox,Lidocaine,Donnatal) (30 mLs Oral Given 01/19/17 0839)    Dawn Rice is 61 y.o. female presenting with Epigastric and  retrosternal chest pain onset at 6 PM last night associated with nausea and several episodes of emesis. No tenderness palpation along the abdomen. Consider gastritis however also consider ACS. Patient with unchanged EKG. Multiple cardiac risk factors(obesity, former smoker, family history of ACS, diabetes, hypertension, hyperlipidemia), normal troponin. Will give Carafate and aspirin. Nitroglycerin has relieved her pain to 2 out of 10. Will also try GI cocktail. Will need admission for chest pain rule out  Discussed with [facility] who accepts admission with Dr. Julien Nordmann. Has asked that we have cardiology consult not emergently. This with Dr. Olivia Mackie  who will have the cardiology team evaluate the patient on the floor.   Final Clinical Impressions(s) / ED Diagnoses   Final diagnoses:  Chest pain, unspecified type    New Prescriptions Current Discharge Medication List       Wynetta Emery, PA-C 01/19/17 1022    Alvira Monday, MD 01/22/17 1748    Wynetta Emery, PA-C 01/30/17 1557    Alvira Monday, MD 02/01/17 [phone]

## 2017-01-19 NOTE — H&P (Signed)
History and Physical    Dawn Rice:[id number] DOB: [birth year] DOA: 01/19/2017   PCP: Gwynneth Aliment, MD   Patient coming from:  Home   Chief Complaint: Chest pain   HPI: Dawn Rice is a 61 y.o. female with medical history significant for HTN, HLD, obesity, gout, presenting to the ED with substernal chest pain. In review, she experienced similar symptoms last evening around 6 pm, accompanied with multiple episodes of non bloody, non bilious, non coffee ground emesis, as well as burping, shortly after eating a heavy meal. Pain woke her up around 4:15 again, similar, in nature, stable, lasting about 20 minutes. She had some diaphoresis without radiation to the left jaw and left arm, currently at 2-3/4. Pain notworsened with deep inspiration, movement or exertion. Some of her pain is reproducible at the lower sternum.  Patient took ASA 324 mg on arrival, and then given NTG with some relief. She continues to have some lingering pain. Denies any dizziness or falls. No syncope or presyncope. Denies any cough. Denies any fever or chills.  Denies any leg swelling or calf pain. Denies any headaches or vision changes. Denies any  recent seizures.  No confusion reported. Never had a Cardiac cath. Had been seen by cardiology in 2009 , at which time she had a stress test (2009), non revealing. She was seen in the ER in 01/2016 with similar symptoms but were felt not to be of cardiac nature.  No recent long distance trips. Denies any new stressors. No new meds. Not on hormonal therapy.  No new herbal supplements No tobacco since 2009.  No ETOH or recreational drugs.+family history of cardiac disease. No recent surgeries.. She feels more tired than usual.   ED Course:  BP (!) 157/98 (BP Location: Left Arm)   Pulse 74   Temp 98.1 F (36.7 C) (Oral)   Resp 18   Ht 5\' 2"  (1.575 m)   Wt 89.4 kg (197 lb)   SpO2 100%   BMI 36.03 kg/m    hemoglobin 10.5 white count 6.2 glucose 144 chest x-ray  without acute findings elbow  x-ray negative  troponin 0  EKG sinus rhythm with undetermined until you infarct, no when compared to a EKG from 01/2016   Review of Systems: As per HPI otherwise 10 point review of systems negative.   Past Medical History:  Diagnosis Date  . Diabetes mellitus   . Epilepsy (HCC)   . Gout   . H/O: hysterectomy   . Hyperlipidemia   . Hypertension   . S/P cholecystectomy     Past Surgical History:  Procedure Laterality Date  . ABDOMINAL HYSTERECTOMY    . CHOLECYSTECTOMY      Social History Social History   Social History  . Marital status: Widowed    Spouse name: N/A  . Number of children: N/A  . Years of education: N/A   Occupational History  . Not on file.   Social History Main Topics  . Smoking status: Former Smoker    Types: Cigarettes  . Smokeless tobacco: Never Used  . Alcohol use Yes  . Drug use: No  . Sexual activity: Yes   Other Topics Concern  . Not on file   Social History Narrative  . No narrative on file     Allergies  Allergen Reactions  . Penicillins Itching    Has patient had a PCN reaction causing immediate rash, facial/tongue/throat swelling, SOB or lightheadedness with hypotension: No Has patient had  a PCN reaction causing severe rash involving mucus membranes or skin necrosis: No Has patient had a PCN reaction that required hospitalization No Has patient had a PCN reaction occurring within the last 10 years: No If all of the above answers are "NO", then may proceed with Cephalosporin use.    Family History  Problem Relation Age of Onset  . Diabetes Mother   . Hypertension Mother   . Diabetes Father       Prior to Admission medications   Medication Sig Start Date End Date Taking? Authorizing Provider  allopurinol (ZYLOPRIM) 100 MG tablet Take 100 mg by mouth daily.   Yes Historical Provider, MD  amLODipine (NORVASC) 2.5 MG tablet Take 2.5 mg by mouth daily. 10/23/16  Yes Historical Provider, MD    atorvastatin (LIPITOR) 80 MG tablet Take 80 mg by mouth daily. 06/26/15  Yes Historical Provider, MD  carbamazepine (CARBATROL) 200 MG 12 hr capsule Take 200 mg by mouth daily. 08/19/15  Yes Historical Provider, MD  dapagliflozin propanediol (FARXIGA) 5 MG TABS tablet Take 5 mg by mouth daily.   Yes Historical Provider, MD  lisinopril (PRINIVIL,ZESTRIL) 40 MG tablet Take 40 mg by mouth daily. 07/05/15  Yes Historical Provider, MD  metoprolol tartrate (LOPRESSOR) 25 MG tablet Take 25 mg by mouth 2 (two) times daily.   Yes Historical Provider, MD  SitaGLIPtin-MetFORMIN HCl (JANUMET XR) [phone] MG TB24 Take 1 tablet by mouth daily with supper.   Yes Historical Provider, MD    Physical Exam:  Vitals:   01/19/17 0800 01/19/17 0815 01/19/17 0830 01/19/17 0933  BP: 152/85 152/92 151/89 (!) 157/98  Pulse: 73 78 80 74  Resp: 16 15 18 18   Temp:    98.1 F (36.7 C)  TempSrc:    Oral  SpO2: 96% 97% 97% 100%  Weight:    89.4 kg (197 lb)  Height:    5\' 2"  (1.575 m)   Constitutional: NAD, calm, comfortable   Eyes: PERRL, lids and conjunctivae normal ENMT: Mucous membranes are moist, without exudate or lesions  Neck: normal, supple, no masses, no thyromegaly Respiratory: clear to auscultation bilaterally, no wheezing, no crackles. Normal respiratory effort  Cardiovascular: Regular rate and rhythm, no murmurs / rubs / gallops. No extremity edema. 2+ pedal pulses. No carotid bruits.  Abdomen: Soft,  Obese.non tender, No hepatosplenomegaly. Bowel sounds positive.  Musculoskeletal: no clubbing / cyanosis. Moves all extremities. Tender to palpation on the lower sternal area. Left elbow tenderness  Skin: no jaundice, No lesions.  Neurologic: Sensation intact  Strength equal all extremities  Psychiatric:   Alert and oriented x 3. Normal mood.     Labs on Admission: I have personally reviewed following labs and imaging studies  CBC:  Recent Labs Lab 01/19/17 0503  WBC 6.2  HGB 10.5*  HCT 34.0*   MCV 75.4*  PLT 318    Basic Metabolic Panel:  Recent Labs Lab 01/19/17 0503  NA 139  K 4.0  CL 106  CO2 21*  GLUCOSE 144*  BUN 14  CREATININE 0.74  CALCIUM 9.5    GFR: Estimated Creatinine Clearance: 79.6 mL/min (by C-G formula based on SCr of 0.74 mg/dL).  Liver Function Tests:  Recent Labs Lab 01/19/17 0503  AST 25  ALT 19  ALKPHOS 110  BILITOT 0.3  PROT 7.1  ALBUMIN 3.5    Recent Labs Lab 01/19/17 0503  LIPASE 22   No results for input(s): AMMONIA in the last 168 hours.  Coagulation Profile: No results  for input(s): INR, PROTIME in the last 168 hours.  Cardiac Enzymes: No results for input(s): CKTOTAL, CKMB, CKMBINDEX, TROPONINI in the last 168 hours.  BNP (last 3 results) No results for input(s): PROBNP in the last 8760 hours.  HbA1C: No results for input(s): HGBA1C in the last 72 hours.  CBG: No results for input(s): GLUCAP in the last 168 hours.  Lipid Profile: No results for input(s): CHOL, HDL, LDLCALC, TRIG, CHOLHDL, LDLDIRECT in the last 72 hours.  Thyroid Function Tests: No results for input(s): TSH, T4TOTAL, FREET4, T3FREE, THYROIDAB in the last 72 hours.  Anemia Panel: No results for input(s): VITAMINB12, FOLATE, FERRITIN, TIBC, IRON, RETICCTPCT in the last 72 hours.  Urine analysis:    Component Value Date/Time   COLORURINE YELLOW 04/16/2014 1923   APPEARANCEUR CLOUDY (A) 04/16/2014 1923   LABSPEC 1.018 04/16/2014 1923   PHURINE 6.0 04/16/2014 1923   GLUCOSEU NEGATIVE 04/16/2014 1923   HGBUR SMALL (A) 04/16/2014 1923   BILIRUBINUR NEGATIVE 04/16/2014 1923   KETONESUR NEGATIVE 04/16/2014 1923   PROTEINUR NEGATIVE 04/16/2014 1923   UROBILINOGEN 0.2 04/16/2014 1923   NITRITE NEGATIVE 04/16/2014 1923   LEUKOCYTESUR MODERATE (A) 04/16/2014 1923    Sepsis Labs: @LABRCNTIP (procalcitonin:4,lacticidven:4) )No results found for this or any previous visit (from the past 240 hour(s)).   Radiological Exams on Admission: Dg  Chest 2 View  Result Date: 01/19/2017 CLINICAL DATA:  61 year old female with chest pain. EXAM: CHEST  2 VIEW COMPARISON:  Chest radiograph dated 02/15/2016 FINDINGS: The heart size and mediastinal contours are within normal limits. Both lungs are clear. The visualized skeletal structures are unremarkable. Right upper quadrant cholecystectomy clips. IMPRESSION: No active cardiopulmonary disease. Electronically Signed   By: Elgie Collard M.D.   On: 01/19/2017 05:59   Dg Elbow Complete Right (3+view)  Result Date: 01/18/2017 CLINICAL DATA:  Knot all of the right elbow which appear 2 months ago with soreness and pain. No known injury. EXAM: RIGHT ELBOW - COMPLETE 3+ VIEW COMPARISON:  None. FINDINGS: A marker is placed over the region of concern along the lateral aspect of the elbow at the level of the metaphysis of the humerus. Underlying soft tissues and bony structures appear normal. There is no fracture dislocation. No joint effusion. IMPRESSION: Negative examination. No abnormality is identified in the region of concern. Electronically Signed   By: Drusilla Kanner M.D.   On: 01/18/2017 16:59    EKG: Independently reviewed.  Assessment/Plan Active Problems:   Chest pain   Gout   Hypertension   Hyperlipidemia   Diabetes (HCC)   Seizures (HCC)    Chest pain syndrome, cardiac versus GI vs. Musculoskeletal  HEART score 5-6 . Troponin 0 , EKG  SR  With some ST changes since last tracing in 01/2016 . CP somewhat improved with  by nitroglycerin  aspirin.  She has also received Carafate. CP currently around 2/10. CXR unrevealing. Last stress test 2009 essentially negative.  Last echo in 2010 moderate LVH.   and EF  55% to 65%. Several ER visit with similar symptoms. Risk factors include obesity, HTN, HLD, former tobacco, and family history of cardiac disease. Patient has chronic anemia at least dating back to 2010 which may add to her cardiac like symptoms   Admit to Telemetry/ Observation Chest  pain order set Cycle troponins Serial EKG  continue ASA, O2 and NTG as needed GI cocktail Protonix 40 mg daily  Check Lipid panel   Agree with Consult to Cards, as requested by EDP  Patient may need stress test as OP id not cardiac cath is indicated, versus cardiology follow up once discharged  Pain control    Hypertension BP (!) 157/98 (BP Location: Left Arm)   Pulse 74   Temp 98.1 F (36.7 C) (Oral)   Resp 18   Ht 5\' 2"  (1.575 m)   Wt 89.4 kg (197 lb)   SpO2 100%   BMI 36.03 kg/m  Controlled Continue home anti-hypertensive medications including Norvasc, lisinopril, Lopressor   Hyperlipidemia Continue Lipitor    Type II Diabetes Current blood sugar level is 144 Lab Results  Component Value Date   HGBA1C (H) 07/22/2010    8.3 (NOTE)                                                                       According to the ADA Clinical Practice Recommendations for 2011, when HbA1c is used as a screening test:   >=6.5%   Diagnostic of Diabetes Mellitus           (if abnormal result  is confirmed)  5.7-6.4%   Increased risk of developing Diabetes Mellitus  References:Diagnosis and Classification of Diabetes Mellitus,Diabetes Care,2011,34(Suppl 1):S62-S69 and Standards of Medical Care in         Diabetes - 2011,Diabetes Care,2011,34  (Suppl 1):S11-S61.  Hgb A1C Hold home oral diabetic medications.  SSI Heart healthy carb modified diet.   History of seizures, not new events Continue carbamazepine   Gout, recent flare on right elbow Continue Allopurinol   Anemia, microcytic, chronic Hemoglobin on admission 10.9 at baseline. MCV low to mid 70s This may add to her cardiac-like symptoms. Never had a colonoscopy Repeat CBC in am Check anemia panel, may need Fe supplements Check TSH  MAy need to follow up with PCP for GI referral    DVT prophylaxis: Lovenox  Code Status:   Full   Family Communication:  Discussed with patient Disposition Plan: Expect patient to be  discharged to home after condition improves Consults called:    Cards as per EDP  Admission status:Tele  Obs    [facility] E, PA-C [facility]   01/19/2017, 9:42 AM

## 2017-01-20 ENCOUNTER — Other Ambulatory Visit (HOSPITAL_COMMUNITY): Payer: Medicare Other

## 2017-01-20 ENCOUNTER — Observation Stay (HOSPITAL_BASED_OUTPATIENT_CLINIC_OR_DEPARTMENT_OTHER): Payer: Medicare Other

## 2017-01-20 DIAGNOSIS — I10 Essential (primary) hypertension: Secondary | ICD-10-CM | POA: Diagnosis not present

## 2017-01-20 DIAGNOSIS — R0789 Other chest pain: Secondary | ICD-10-CM | POA: Diagnosis not present

## 2017-01-20 DIAGNOSIS — M109 Gout, unspecified: Secondary | ICD-10-CM | POA: Diagnosis not present

## 2017-01-20 DIAGNOSIS — R079 Chest pain, unspecified: Secondary | ICD-10-CM

## 2017-01-20 DIAGNOSIS — E785 Hyperlipidemia, unspecified: Secondary | ICD-10-CM | POA: Diagnosis not present

## 2017-01-20 DIAGNOSIS — E782 Mixed hyperlipidemia: Secondary | ICD-10-CM | POA: Diagnosis not present

## 2017-01-20 LAB — NM MYOCAR MULTI W/SPECT W/WALL MOTION / EF
CHL CUP MPHR: 162 {beats}/min
CHL CUP RESTING HR STRESS: 66 {beats}/min
CSEPHR: 84 %
Estimated workload: 1 METS
Exercise duration (min): 0 min
Exercise duration (sec): 0 s
Peak HR: 137 {beats}/min

## 2017-01-20 LAB — GLUCOSE, CAPILLARY
Glucose-Capillary: 171 mg/dL — ABNORMAL HIGH (ref 65–99)
Glucose-Capillary: 230 mg/dL — ABNORMAL HIGH (ref 65–99)

## 2017-01-20 LAB — HEMOGLOBIN A1C
Hgb A1c MFr Bld: 6.6 % — ABNORMAL HIGH (ref 4.8–5.6)
MEAN PLASMA GLUCOSE: 143 mg/dL

## 2017-01-20 MED ORDER — REGADENOSON 0.4 MG/5ML IV SOLN
INTRAVENOUS | Status: AC
  Filled 2017-01-20: qty 5

## 2017-01-20 MED ORDER — AMLODIPINE BESYLATE 2.5 MG PO TABS: 5.0000 mg | ORAL_TABLET | Freq: Every day | ORAL | 0 refills | Status: DC

## 2017-01-20 MED ORDER — TECHNETIUM TC 99M TETROFOSMIN IV KIT
30.0000 | PACK | Freq: Once | INTRAVENOUS | Status: AC | PRN
  Administered 2017-01-20: 30 via INTRAVENOUS

## 2017-01-20 MED ORDER — AMLODIPINE BESYLATE 5 MG PO TABS: 5.0000 mg | ORAL_TABLET | Freq: Every day | ORAL | Status: DC

## 2017-01-20 MED ORDER — REGADENOSON 0.4 MG/5ML IV SOLN
0.4000 mg | Freq: Once | INTRAVENOUS | Status: AC
  Administered 2017-01-20: 0.4 mg via INTRAVENOUS

## 2017-01-20 MED ORDER — TECHNETIUM TC 99M TETROFOSMIN IV KIT
10.0000 | PACK | Freq: Once | INTRAVENOUS | Status: AC | PRN
  Administered 2017-01-20: 10 via INTRAVENOUS

## 2017-01-20 NOTE — Progress Notes (Signed)
Progress Note  Patient Name: Dawn Rice Date of Encounter: 01/20/2017  Primary Cardiologist: None  Subjective   Just returned from stress test. Feeling better today.  Inpatient Medications    Scheduled Meds: . regadenoson      . allopurinol  100 mg Oral Daily  . aspirin EC  81 mg Oral Daily  . atorvastatin  80 mg Oral Daily  . carbamazepine  200 mg Oral Daily  . enoxaparin (LOVENOX) injection  40 mg Subcutaneous Q24H  . ezetimibe  10 mg Oral Daily  . insulin aspart  0-9 Units Subcutaneous TID WC  . lisinopril  40 mg Oral Daily  . metoprolol tartrate  25 mg Oral Daily  . pantoprazole  40 mg Oral Daily   Continuous Infusions:  PRN Meds: acetaminophen, gi cocktail, morphine injection, ondansetron (ZOFRAN) IV   Vital Signs    Vitals:   01/20/17 0847 01/20/17 0948 01/20/17 0950 01/20/17 0952  BP: (!) 155/86 (!) 150/83 (!) 163/85 (!) 164/85  Pulse: 67 (!) 120 (!) 122 (!) 102  Resp:      Temp:      TempSrc:      SpO2:      Weight:      Height:        Intake/Output Summary (Last 24 hours) at 01/20/17 1117 Last data filed at 01/19/17 1230  Gross per 24 hour  Intake              360 ml  Output                2 ml  Net              358 ml   Filed Weights   01/19/17 0933  Weight: 197 lb (89.4 kg)    Telemetry     - Personally Reviewed  ECG     - Personally Reviewed  Physical Exam   GEN: No acute distress.  Neck: No JVD Cardiac: RRR, no murmurs, rubs, or gallops.  Respiratory: Clear to auscultation bilaterally. GI: Soft, nontender, non-distended  MS: No edema; No deformity. Neuro:  AAOx3. Psych: Normal affect  Labs    Chemistry Recent Labs Lab 01/19/17 0503  NA 139  K 4.0  CL 106  CO2 21*  GLUCOSE 144*  BUN 14  CREATININE 0.74  CALCIUM 9.5  PROT 7.1  ALBUMIN 3.5  AST 25  ALT 19  ALKPHOS 110  BILITOT 0.3  GFRNONAA >60  GFRAA >60  ANIONGAP 12     Hematology Recent Labs Lab 01/19/17 0503 01/19/17 1330  WBC 6.2  --     RBC 4.51 4.13  HGB 10.5*  --   HCT 34.0*  --   MCV 75.4*  --   MCH 23.3*  --   MCHC 30.9  --   RDW 17.3*  --   PLT 318  --     Cardiac Enzymes Recent Labs Lab 01/19/17 0906 01/19/17 1330 01/19/17 1854  TROPONINI <0.03 <0.03 <0.03    Recent Labs Lab 01/19/17 0516  TROPIPOC 0.00     BNPNo results for input(s): BNP, PROBNP in the last 168 hours.   DDimer No results for input(s): DDIMER in the last 168 hours.   Radiology    Dg Chest 2 View  Result Date: 01/19/2017 CLINICAL DATA:  61 year old female with chest pain. EXAM: CHEST  2 VIEW COMPARISON:  Chest radiograph dated 02/15/2016 FINDINGS: The heart size and mediastinal contours are within normal limits. Both lungs  are clear. The visualized skeletal structures are unremarkable. Right upper quadrant cholecystectomy clips. IMPRESSION: No active cardiopulmonary disease. Electronically Signed   By: Elgie CollardArash  Radparvar M.D.   On: 01/19/2017 05:59   Dg Elbow Complete Right (3+view)  Result Date: 01/18/2017 CLINICAL DATA:  Knot all of the right elbow which appear 2 months ago with soreness and pain. No known injury. EXAM: RIGHT ELBOW - COMPLETE 3+ VIEW COMPARISON:  None. FINDINGS: A marker is placed over the region of concern along the lateral aspect of the elbow at the level of the metaphysis of the humerus. Underlying soft tissues and bony structures appear normal. There is no fracture dislocation. No joint effusion. IMPRESSION: Negative examination. No abnormality is identified in the region of concern. Electronically Signed   By: Drusilla Kannerhomas  Dalessio M.D.   On: 01/18/2017 16:59    Cardiac Studies   Stress test pending  Patient Profile     61 y.o. female with PMH significant for diabetes, anemia of chronic disease, hypertension, obesity, hyperlipidemia, and tobacco abuse presented yesterday morning with chest pain, n/v, and normal troponins.  Assessment & Plan    1. Chest pain: Both typical and atypical symptoms for ischemic  heart disease. However, has several CV risk factors. ECG and troponins unremarkable.  I have arranged for echocardiography to assess cardiac structure and function given inferior wall hypokinesis seen on stress testing in 2009 (pending) Lexiscan Myoview stress test performed, results pending. Continue ASA, Lipitor, and metoprolol.  2. Hypertension: Elevated on lisinopril. Will restart amlodipine at 5 mg daily.  3. Hyperlipidemia: TC and LDL remain elevated on Lipitor 80 mg. I added Zetia 10 mg on 1/27.  Signed, Prentice DockerSuresh Tracker Mance, MD  01/20/2017, 11:17 AM

## 2017-01-20 NOTE — Progress Notes (Signed)
Myoview result  IMPRESSION: 1. No reversible ischemia or infarction.  2. Normal left ventricular wall motion.  3. Left ventricular ejection fraction is 57%.  4. Non invasive risk stratification*: Low    Ramond DialSigned, Hisashi Amadon PA Pager: [phone]

## 2017-01-20 NOTE — Discharge Summary (Signed)
Physician Discharge Summary  Dawn Rice WUJ:[id number] DOB: 07-Mar-1956 DOA: 01/19/2017  PCP: Gwynneth Aliment, MD  Admit date: 01/19/2017 Discharge date: 01/20/2017  Admitted From: Home Disposition: Home  Recommendations for Outpatient Follow-up:  1. Follow up with PCP in 1-2 weeks   Home Health: None Equipment/Devices: None  Discharge Condition: Stable CODE STATUS: Full code Diet recommendation: Heart Healthy/carb modified    Discharge Diagnoses:  Principal Problem:   Chest pain, atypical  Active Problems:   Gout   Essential Hypertension   Hyperlipidemia   Diabetes (HCC)   Seizures (HCC)  Brief narrative/history of present illness 61 year old female with history of hypertension, hyperlipidemia, gout, diabetes mellitus and GERD presented to the ED with substernal chest pain for 1 day duration. This was associated with multiple episode of nonbloody, nonbilious vomiting shortly after a heavy meal. Symptoms subsided but pain woke her up again in the morning a similar nature lasting for about 20 minutes. She had some diaphoresis. Pain was nonradiating and not associated with movement or deep inspiration. She did have reproducible pain on exam. She took full dose aspirin and was given some nitroglycerin in the ED with some relief. Vitals in the ED was stable except for elevated blood pressure. EKG and initial troponin was negative. Hospitalist consulted and patient placed on observation.  Principal problem Chest pain Stable on telemetry. Serial cardiac enzyme was negative for ACS. No further chest pain symptoms. Cartilage was consulted given her heart score of 5. She underwent Lexiscan Myoview which was negative for reversible ischemia or infarction and showed normal left ventricle wall motion with EF of 57%. I suspect her symptoms were either musculoskeletal versus associated with acid reflux. Stable to be discharged home with outpatient follow-up.  Uncontrolled  hypertension Increase her amlodipine to 5 mg daily. Continue metoprolol and lisinopril.  Diabetes mellitus type 2 Continue Janumet.  Hyperlipidemia Continue statin  Consults: Cardiology  Family communication: Son at bedside  Procedure: [facility]   Discharge Instructions   Allergies as of 01/20/2017      Reactions   Penicillins Itching   Has patient had a PCN reaction causing immediate rash, facial/tongue/throat swelling, SOB or lightheadedness with hypotension: No Has patient had a PCN reaction causing severe rash involving mucus membranes or skin necrosis: No Has patient had a PCN reaction that required hospitalization No Has patient had a PCN reaction occurring within the last 10 years: No If all of the above answers are "NO", then may proceed with Cephalosporin use.      Medication List    TAKE these medications   allopurinol 100 MG tablet Commonly known as:  ZYLOPRIM Take 100 mg by mouth daily.   amLODipine 2.5 MG tablet Commonly known as:  NORVASC Take 2 tablets (5 mg total) by mouth daily. What changed:  how much to take   atorvastatin 80 MG tablet Commonly known as:  LIPITOR Take 80 mg by mouth daily.   carbamazepine 200 MG 12 hr capsule Commonly known as:  CARBATROL Take 200 mg by mouth daily.   FARXIGA 5 MG Tabs tablet Generic drug:  dapagliflozin propanediol Take 5 mg by mouth daily.   JANUMET XR [id number] MG Tb24 Generic drug:  SitaGLIPtin-MetFORMIN HCl Take 1 tablet by mouth daily with supper.   lisinopril 40 MG tablet Commonly known as:  PRINIVIL,ZESTRIL Take 40 mg by mouth daily.   metoprolol tartrate 25 MG tablet Commonly known as:  LOPRESSOR Take 25 mg by mouth 2 (two) times daily.  Follow-up Information    Gwynneth Alimentobyn N Sanders, MD Follow up in 2 week(s).   Specialty:  Internal Medicine Contact information: [address] [place] [address] [phone]          Allergies  Allergen Reactions  .  Penicillins Itching    Has patient had a PCN reaction causing immediate rash, facial/tongue/throat swelling, SOB or lightheadedness with hypotension: No Has patient had a PCN reaction causing severe rash involving mucus membranes or skin necrosis: No Has patient had a PCN reaction that required hospitalization No Has patient had a PCN reaction occurring within the last 10 years: No If all of the above answers are "NO", then may proceed with Cephalosporin use.      Procedures/Studies: Dg Chest 2 View  Result Date: 01/19/2017 CLINICAL DATA:  61 year old female with chest pain. EXAM: CHEST  2 VIEW COMPARISON:  Chest radiograph dated 02/15/2016 FINDINGS: The heart size and mediastinal contours are within normal limits. Both lungs are clear. The visualized skeletal structures are unremarkable. Right upper quadrant cholecystectomy clips. IMPRESSION: No active cardiopulmonary disease. Electronically Signed   By: Elgie CollardArash  Radparvar M.D.   On: 01/19/2017 05:59   Dg Elbow Complete Right (3+view)  Result Date: 01/18/2017 CLINICAL DATA:  Knot all of the right elbow which appear 2 months ago with soreness and pain. No known injury. EXAM: RIGHT ELBOW - COMPLETE 3+ VIEW COMPARISON:  None. FINDINGS: A marker is placed over the region of concern along the lateral aspect of the elbow at the level of the metaphysis of the humerus. Underlying soft tissues and bony structures appear normal. There is no fracture dislocation. No joint effusion. IMPRESSION: Negative examination. No abnormality is identified in the region of concern. Electronically Signed   By: Drusilla Kannerhomas  Dalessio M.D.   On: 01/18/2017 16:59   Nm Myocar Multi W/spect W/wall Motion / Ef  Result Date: 01/20/2017 CLINICAL DATA:  61 year old with chest pain. EXAM: MYOCARDIAL IMAGING WITH SPECT (REST AND PHARMACOLOGIC-STRESS) GATED LEFT VENTRICULAR WALL MOTION STUDY LEFT VENTRICULAR EJECTION FRACTION TECHNIQUE: Standard myocardial SPECT imaging was performed  after resting intravenous injection of 10 mCi [id number] tetrofosmin. Subsequently, intravenous infusion of Lexiscan was performed under the supervision of the Cardiology staff. At peak effect of the drug, 30 mCi Tc-538m tetrofosmin was injected intravenously and standard myocardial SPECT imaging was performed. Quantitative gated imaging was also performed to evaluate left ventricular wall motion, and estimate left ventricular ejection fraction. COMPARISON:  Chest radiograph 01/19/2017 FINDINGS: Perfusion: No decreased activity in the left ventricle on stress imaging to suggest reversible ischemia or infarction. Wall Motion: Normal left ventricular wall motion. No left ventricular dilation. Left Ventricular Ejection Fraction: 57 % End diastolic volume 77 ml End systolic volume 33 ml IMPRESSION: 1. No reversible ischemia or infarction. 2. Normal left ventricular wall motion. 3. Left ventricular ejection fraction is 57%. 4. Non invasive risk stratification*: Low *2012 Appropriate Use Criteria for Coronary Revascularization Focused Update: J Am Coll Cardiol. 2012;59(9):857-881. http://content.dementiazones.comonlinejacc.org/article.aspx?articleid=1201161 Electronically Signed   By: Richarda OverlieAdam  Henn M.D.   On: 01/20/2017 14:30    Lexiscan Myoview IMPRESSION: 1. No reversible ischemia or infarction.  2. Normal left ventricular wall motion.  3. Left ventricular ejection fraction is 57%.  4. Non invasive risk stratification*: Low   Subjective: No further chest pain symptoms. Denies heartburn or shortness of breath.  Discharge Exam: Vitals:   01/20/17 0950 01/20/17 0952  BP: (!) 163/85 (!) 164/85  Pulse: (!) 122 (!) 102  Resp:    Temp:  Vitals:   01/20/17 0847 01/20/17 0948 01/20/17 0950 01/20/17 0952  BP: (!) 155/86 (!) 150/83 (!) 163/85 (!) 164/85  Pulse: 67 (!) 120 (!) 122 (!) 102  Resp:      Temp:      TempSrc:      SpO2:      Weight:      Height:        General: Middle aged female not in distress HEENT:  Moist mucosa, supple neck Chest: Clear to auscultation bilaterally  CVS: Normal S1 and S2, no murmurs GI: Soft, nondistended, nontender Musculoskeletal: Warm, no edema     The results of significant diagnostics from this hospitalization (including imaging, microbiology, ancillary and laboratory) are listed below for reference.     Microbiology: No results found for this or any previous visit (from the past 240 hour(s)).   Labs: BNP (last 3 results) No results for input(s): BNP in the last 8760 hours. Basic Metabolic Panel:  Recent Labs Lab 01/19/17 0503  NA 139  K 4.0  CL 106  CO2 21*  GLUCOSE 144*  BUN 14  CREATININE 0.74  CALCIUM 9.5   Liver Function Tests:  Recent Labs Lab 01/19/17 0503  AST 25  ALT 19  ALKPHOS 110  BILITOT 0.3  PROT 7.1  ALBUMIN 3.5    Recent Labs Lab 01/19/17 0503  LIPASE 22   No results for input(s): AMMONIA in the last 168 hours. CBC:  Recent Labs Lab 01/19/17 0503  WBC 6.2  HGB 10.5*  HCT 34.0*  MCV 75.4*  PLT 318   Cardiac Enzymes:  Recent Labs Lab 01/19/17 0906 01/19/17 1330 01/19/17 1854  TROPONINI <0.03 <0.03 <0.03   BNP: Invalid input(s): POCBNP CBG:  Recent Labs Lab 01/19/17 1104 01/19/17 1604 01/19/17 2204 01/20/17 0557 01/20/17 1114  GLUCAP 176* 149* 182* 171* 230*   D-Dimer No results for input(s): DDIMER in the last 72 hours. Hgb A1c  Recent Labs  01/19/17 0906  HGBA1C 6.6*   Lipid Profile  Recent Labs  01/19/17 1002  CHOL 169  HDL 52  LDLCALC 93  TRIG 120  CHOLHDL 3.3   Thyroid function studies No results for input(s): TSH, T4TOTAL, T3FREE, THYROIDAB in the last 72 hours.  Invalid input(s): FREET3 Anemia work up  Recent Labs  01/19/17 1002 01/19/17 1330  VITAMINB12  --  164*  FOLATE 9.6  --   FERRITIN  --  8*  TIBC  --  351  IRON  --  30  RETICCTPCT  --  1.2   Urinalysis    Component Value Date/Time   COLORURINE YELLOW 04/16/2014 1923   APPEARANCEUR CLOUDY  (A) 04/16/2014 1923   LABSPEC 1.018 04/16/2014 1923   PHURINE 6.0 04/16/2014 1923   GLUCOSEU NEGATIVE 04/16/2014 1923   HGBUR SMALL (A) 04/16/2014 1923   BILIRUBINUR NEGATIVE 04/16/2014 1923   KETONESUR NEGATIVE 04/16/2014 1923   PROTEINUR NEGATIVE 04/16/2014 1923   UROBILINOGEN 0.2 04/16/2014 1923   NITRITE NEGATIVE 04/16/2014 1923   LEUKOCYTESUR MODERATE (A) 04/16/2014 1923   Sepsis Labs Invalid input(s): PROCALCITONIN,  WBC,  LACTICIDVEN Microbiology No results found for this or any previous visit (from the past 240 hour(s)).   Time coordinating discharge: < 30 minutes  SIGNED:   Eddie North, MD  [facility] 01/20/2017, 2:57 PM Pager   If 7PM-7AM, please contact night-coverage [email or web address] Password [email or web address]

## 2017-01-20 NOTE — Care Management Obs Status (Signed)
MEDICARE OBSERVATION STATUS NOTIFICATION   Patient Details  Name: Dawn BanterLinda F Coreas MRN: [id number] Date of Birth: 10/12/1956   Medicare Observation Status Notification Given:  Yes    Darrold SpanWebster, Tushar Enns Hall, RN 01/20/2017, 2:55 PM

## 2017-01-20 NOTE — Progress Notes (Signed)
Discussed with the patient and all questioned fully answered. She will call me if any problems arise. Pt verbalized understanding of discharge instructions.  IV removed. Telemetry removed, CCMD notified.   Leonidas Rombergaitlin S Bumbledare, RN

## 2017-02-11 ENCOUNTER — Other Ambulatory Visit: Payer: Self-pay | Admitting: Internal Medicine

## 2017-02-11 DIAGNOSIS — R946 Abnormal results of thyroid function studies: Secondary | ICD-10-CM

## 2017-02-15 ENCOUNTER — Ambulatory Visit
Admission: RE | Admit: 2017-02-15 | Discharge: 2017-02-15 | Disposition: A | Discharge status: Home / Self Care | Payer: Medicare Other | Source: Ambulatory Visit | Attending: Internal Medicine | Admitting: Internal Medicine

## 2017-02-15 DIAGNOSIS — R946 Abnormal results of thyroid function studies: Secondary | ICD-10-CM

## 2017-05-17 ENCOUNTER — Other Ambulatory Visit (HOSPITAL_COMMUNITY): Payer: Self-pay | Admitting: Internal Medicine

## 2017-05-17 DIAGNOSIS — E049 Nontoxic goiter, unspecified: Secondary | ICD-10-CM

## 2017-05-17 DIAGNOSIS — E042 Nontoxic multinodular goiter: Secondary | ICD-10-CM

## 2017-06-03 ENCOUNTER — Ambulatory Visit (HOSPITAL_COMMUNITY): Payer: Medicare Other

## 2017-06-04 ENCOUNTER — Encounter (HOSPITAL_COMMUNITY): Payer: Medicare Other

## 2017-06-19 ENCOUNTER — Ambulatory Visit (HOSPITAL_COMMUNITY)
Admission: RE | Admit: 2017-06-19 | Discharge: 2017-06-19 | Disposition: A | Discharge status: Home / Self Care | Payer: Medicare Other | Source: Ambulatory Visit | Attending: Internal Medicine | Admitting: Internal Medicine

## 2017-06-19 DIAGNOSIS — E042 Nontoxic multinodular goiter: Secondary | ICD-10-CM | POA: Insufficient documentation

## 2017-06-19 DIAGNOSIS — E049 Nontoxic goiter, unspecified: Secondary | ICD-10-CM

## 2017-06-19 MED ORDER — SODIUM IODIDE I 131 CAPSULE
6.9100 | Freq: Once | INTRAVENOUS | Status: AC
  Administered 2017-06-19: 6.91 via ORAL

## 2017-06-20 ENCOUNTER — Encounter (HOSPITAL_COMMUNITY)
Admission: RE | Admit: 2017-06-20 | Discharge: 2017-06-20 | Disposition: A | Discharge status: Home / Self Care | Payer: Medicare Other | Source: Ambulatory Visit | Attending: Internal Medicine | Admitting: Internal Medicine

## 2017-06-20 DIAGNOSIS — E042 Nontoxic multinodular goiter: Secondary | ICD-10-CM | POA: Diagnosis present

## 2017-06-20 MED ORDER — SODIUM PERTECHNETATE TC 99M INJECTION
10.7400 | Freq: Once | INTRAVENOUS | Status: AC | PRN
  Administered 2017-06-20: 10.74 via INTRAVENOUS

## 2017-07-21 ENCOUNTER — Inpatient Hospital Stay (HOSPITAL_COMMUNITY)
Admission: EM | Admit: 2017-07-21 | Discharge: 2017-07-24 | DRG: 339 | Disposition: A | Discharge status: Home / Self Care | Payer: Medicare Other | Attending: Surgery | Admitting: Surgery

## 2017-07-21 ENCOUNTER — Emergency Department (HOSPITAL_COMMUNITY): Payer: Medicare Other | Admitting: Certified Registered"

## 2017-07-21 ENCOUNTER — Emergency Department (HOSPITAL_COMMUNITY): Payer: Medicare Other

## 2017-07-21 ENCOUNTER — Encounter (HOSPITAL_COMMUNITY): Admission: EM | Disposition: A | Discharge status: Home / Self Care | Payer: Self-pay | Source: Home / Self Care

## 2017-07-21 ENCOUNTER — Encounter (HOSPITAL_COMMUNITY): Payer: Self-pay | Admitting: Emergency Medicine

## 2017-07-21 ENCOUNTER — Other Ambulatory Visit: Payer: Self-pay

## 2017-07-21 DIAGNOSIS — K352 Acute appendicitis with generalized peritonitis: Principal | ICD-10-CM | POA: Diagnosis present

## 2017-07-21 DIAGNOSIS — Z833 Family history of diabetes mellitus: Secondary | ICD-10-CM

## 2017-07-21 DIAGNOSIS — Z79899 Other long term (current) drug therapy: Secondary | ICD-10-CM

## 2017-07-21 DIAGNOSIS — M109 Gout, unspecified: Secondary | ICD-10-CM | POA: Diagnosis present

## 2017-07-21 DIAGNOSIS — I10 Essential (primary) hypertension: Secondary | ICD-10-CM | POA: Diagnosis present

## 2017-07-21 DIAGNOSIS — Z87891 Personal history of nicotine dependence: Secondary | ICD-10-CM

## 2017-07-21 DIAGNOSIS — G40909 Epilepsy, unspecified, not intractable, without status epilepticus: Secondary | ICD-10-CM | POA: Diagnosis present

## 2017-07-21 DIAGNOSIS — K353 Acute appendicitis with localized peritonitis, without perforation or gangrene: Secondary | ICD-10-CM

## 2017-07-21 DIAGNOSIS — N179 Acute kidney failure, unspecified: Secondary | ICD-10-CM | POA: Diagnosis present

## 2017-07-21 DIAGNOSIS — Z9049 Acquired absence of other specified parts of digestive tract: Secondary | ICD-10-CM

## 2017-07-21 DIAGNOSIS — Z6835 Body mass index (BMI) 35.0-35.9, adult: Secondary | ICD-10-CM

## 2017-07-21 DIAGNOSIS — K3580 Unspecified acute appendicitis: Secondary | ICD-10-CM | POA: Diagnosis present

## 2017-07-21 DIAGNOSIS — Z7984 Long term (current) use of oral hypoglycemic drugs: Secondary | ICD-10-CM

## 2017-07-21 DIAGNOSIS — Z90710 Acquired absence of both cervix and uterus: Secondary | ICD-10-CM

## 2017-07-21 DIAGNOSIS — E785 Hyperlipidemia, unspecified: Secondary | ICD-10-CM | POA: Diagnosis present

## 2017-07-21 DIAGNOSIS — Z880 Allergy status to penicillin: Secondary | ICD-10-CM

## 2017-07-21 DIAGNOSIS — E119 Type 2 diabetes mellitus without complications: Secondary | ICD-10-CM | POA: Diagnosis present

## 2017-07-21 DIAGNOSIS — Z8249 Family history of ischemic heart disease and other diseases of the circulatory system: Secondary | ICD-10-CM

## 2017-07-21 HISTORY — DX: Personal history of other medical treatment: Z92.89

## 2017-07-21 HISTORY — DX: Unspecified convulsions: R56.9

## 2017-07-21 HISTORY — PX: LAPAROSCOPIC APPENDECTOMY: SHX408

## 2017-07-21 HISTORY — DX: Type 2 diabetes mellitus without complications: E11.9

## 2017-07-21 LAB — COMPREHENSIVE METABOLIC PANEL
ALT: 16 U/L (ref 14–54)
ANION GAP: 10 (ref 5–15)
AST: 28 U/L (ref 15–41)
Albumin: 3.7 g/dL (ref 3.5–5.0)
Alkaline Phosphatase: 99 U/L (ref 38–126)
BUN: 12 mg/dL (ref 6–20)
CHLORIDE: 107 mmol/L (ref 101–111)
CO2: 23 mmol/L (ref 22–32)
Calcium: 9.4 mg/dL (ref 8.9–10.3)
Creatinine, Ser: 0.9 mg/dL (ref 0.44–1.00)
GFR calc Af Amer: >60 mL/min (ref >60)
GFR calc non Af Amer: >60 mL/min (ref >60)
Glucose, Bld: 120 mg/dL — ABNORMAL HIGH (ref 65–99)
Potassium: 4 mmol/L (ref 3.5–5.1)
SODIUM: 140 mmol/L (ref 135–145)
Total Bilirubin: 0.3 mg/dL (ref 0.3–1.2)
Total Protein: 6.8 g/dL (ref 6.5–8.1)

## 2017-07-21 LAB — CBC
HCT: 36.3 % (ref 36.0–46.0)
Hemoglobin: 11.3 g/dL — ABNORMAL LOW (ref 12.0–15.0)
MCH: 23.2 pg — ABNORMAL LOW (ref 26.0–34.0)
MCHC: 31.1 g/dL (ref 30.0–36.0)
MCV: 74.5 fL — ABNORMAL LOW (ref 78.0–100.0)
Platelets: 376 K/uL (ref 150–400)
RBC: 4.87 MIL/uL (ref 3.87–5.11)
RDW: 17.9 % — ABNORMAL HIGH (ref 11.5–15.5)
WBC: 7.7 K/uL (ref 4.0–10.5)

## 2017-07-21 LAB — GLUCOSE, CAPILLARY
GLUCOSE-CAPILLARY: 158 mg/dL — AB (ref 65–99)
Glucose-Capillary: 171 mg/dL — ABNORMAL HIGH (ref 65–99)
Glucose-Capillary: 169 mg/dL — ABNORMAL HIGH (ref 65–99)
Glucose-Capillary: 140 mg/dL — ABNORMAL HIGH (ref 65–99)
Glucose-Capillary: 148 mg/dL — ABNORMAL HIGH (ref 65–99)

## 2017-07-21 LAB — LIPASE, BLOOD: LIPASE: 29 U/L (ref 11–51)

## 2017-07-21 LAB — URINALYSIS, ROUTINE W REFLEX MICROSCOPIC
Bilirubin Urine: NEGATIVE
Glucose, UA: >=500 mg/dL — AB
Hgb urine dipstick: NEGATIVE
Ketones, ur: NEGATIVE mg/dL
Nitrite: NEGATIVE
Protein, ur: NEGATIVE mg/dL
Specific Gravity, Urine: 1.009 (ref 1.005–1.030)
pH: 5 (ref 5.0–8.0)

## 2017-07-21 SURGERY — APPENDECTOMY, LAPAROSCOPIC
Anesthesia: General | Site: Abdomen

## 2017-07-21 MED ORDER — SUCCINYLCHOLINE CHLORIDE 200 MG/10ML IV SOSY
PREFILLED_SYRINGE | INTRAVENOUS | Status: DC | PRN
  Administered 2017-07-21: 100 mg via INTRAVENOUS

## 2017-07-21 MED ORDER — SODIUM CHLORIDE 0.9 % IV SOLN
INTRAVENOUS | Status: DC
  Administered 2017-07-21 – 2017-07-24 (×5): via INTRAVENOUS

## 2017-07-21 MED ORDER — METFORMIN HCL ER 500 MG PO TB24
1000.0000 mg | ORAL_TABLET | Freq: Every day | ORAL | Status: DC
  Administered 2017-07-21 – 2017-07-23 (×3): 1000 mg via ORAL
  Filled 2017-07-21 (×3): qty 2

## 2017-07-21 MED ORDER — MORPHINE SULFATE (PF) 4 MG/ML IV SOLN
4.0000 mg | Freq: Once | INTRAVENOUS | Status: AC
  Administered 2017-07-21: 4 mg via INTRAVENOUS
  Filled 2017-07-21: qty 1

## 2017-07-21 MED ORDER — 0.9 % SODIUM CHLORIDE (POUR BTL) OPTIME
TOPICAL | Status: DC | PRN
  Administered 2017-07-21: 1000 mL

## 2017-07-21 MED ORDER — DIPHENHYDRAMINE HCL 50 MG/ML IJ SOLN: 25.0000 mg | Freq: Four times a day (QID) | INTRAMUSCULAR | Status: DC | PRN

## 2017-07-21 MED ORDER — SUCCINYLCHOLINE CHLORIDE 200 MG/10ML IV SOSY
PREFILLED_SYRINGE | INTRAVENOUS | Status: AC
  Filled 2017-07-21: qty 20

## 2017-07-21 MED ORDER — LISINOPRIL 40 MG PO TABS
40.0000 mg | ORAL_TABLET | Freq: Every day | ORAL | Status: DC
  Administered 2017-07-22 – 2017-07-24 (×3): 40 mg via ORAL
  Filled 2017-07-21 (×3): qty 1

## 2017-07-21 MED ORDER — IOPAMIDOL (ISOVUE-300) INJECTION 61%
INTRAVENOUS | Status: AC
Start: 2017-07-21 — End: 2017-07-21
  Administered 2017-07-21: 100 mL
  Filled 2017-07-21: qty 100

## 2017-07-21 MED ORDER — ENOXAPARIN SODIUM 40 MG/0.4ML ~~LOC~~ SOLN
40.0000 mg | SUBCUTANEOUS | Status: DC
  Administered 2017-07-22 – 2017-07-24 (×3): 40 mg via SUBCUTANEOUS
  Filled 2017-07-21 (×3): qty 0.4

## 2017-07-21 MED ORDER — ONDANSETRON HCL 4 MG/2ML IJ SOLN
INTRAMUSCULAR | Status: DC | PRN
  Administered 2017-07-21: 4 mg via INTRAVENOUS

## 2017-07-21 MED ORDER — METOPROLOL TARTRATE 25 MG PO TABS
25.0000 mg | ORAL_TABLET | Freq: Two times a day (BID) | ORAL | Status: DC
  Administered 2017-07-21 – 2017-07-24 (×7): 25 mg via ORAL
  Filled 2017-07-21 (×7): qty 1

## 2017-07-21 MED ORDER — ONDANSETRON HCL 4 MG/2ML IJ SOLN
4.0000 mg | Freq: Four times a day (QID) | INTRAMUSCULAR | Status: DC | PRN
  Administered 2017-07-23 (×2): 4 mg via INTRAVENOUS
  Filled 2017-07-21 (×2): qty 2

## 2017-07-21 MED ORDER — MIDAZOLAM HCL 2 MG/2ML IJ SOLN
INTRAMUSCULAR | Status: AC
  Filled 2017-07-21: qty 2

## 2017-07-21 MED ORDER — SODIUM CHLORIDE 0.9 % IR SOLN
Status: DC | PRN
  Administered 2017-07-21: 1000 mL

## 2017-07-21 MED ORDER — METHOCARBAMOL 500 MG PO TABS
500.0000 mg | ORAL_TABLET | Freq: Four times a day (QID) | ORAL | Status: DC | PRN
  Administered 2017-07-21: 500 mg via ORAL
  Filled 2017-07-21: qty 1

## 2017-07-21 MED ORDER — OXYCODONE HCL 5 MG PO TABS
5.0000 mg | ORAL_TABLET | ORAL | Status: DC | PRN
  Administered 2017-07-21: 10 mg via ORAL
  Administered 2017-07-22: 5 mg via ORAL
  Filled 2017-07-21: qty 1
  Filled 2017-07-21: qty 2

## 2017-07-21 MED ORDER — SITAGLIP PHOS-METFORMIN HCL ER 100-1000 MG PO TB24: 1.0000 | ORAL_TABLET | Freq: Every day | ORAL | Status: DC

## 2017-07-21 MED ORDER — INSULIN ASPART 100 UNIT/ML ~~LOC~~ SOLN
0.0000 [IU] | Freq: Three times a day (TID) | SUBCUTANEOUS | Status: DC
  Administered 2017-07-22: 2 [IU] via SUBCUTANEOUS

## 2017-07-21 MED ORDER — FENTANYL CITRATE (PF) 250 MCG/5ML IJ SOLN
INTRAMUSCULAR | Status: DC | PRN
  Administered 2017-07-21 (×2): 50 ug via INTRAVENOUS

## 2017-07-21 MED ORDER — LIDOCAINE 2% (20 MG/ML) 5 ML SYRINGE
INTRAMUSCULAR | Status: DC | PRN
  Administered 2017-07-21: 60 mg via INTRAVENOUS

## 2017-07-21 MED ORDER — ONDANSETRON 4 MG PO TBDP
4.0000 mg | ORAL_TABLET | Freq: Once | ORAL | Status: AC | PRN
  Administered 2017-07-21: 4 mg via ORAL

## 2017-07-21 MED ORDER — BUPIVACAINE-EPINEPHRINE 0.25% -1:200000 IJ SOLN
INTRAMUSCULAR | Status: DC | PRN
  Administered 2017-07-21: 6 mL

## 2017-07-21 MED ORDER — AMLODIPINE BESYLATE 5 MG PO TABS
5.0000 mg | ORAL_TABLET | Freq: Every day | ORAL | Status: DC
  Administered 2017-07-21 – 2017-07-24 (×4): 5 mg via ORAL
  Filled 2017-07-21 (×2): qty 1
  Filled 2017-07-21: qty 2
  Filled 2017-07-21: qty 1

## 2017-07-21 MED ORDER — PROPOFOL 10 MG/ML IV BOLUS
INTRAVENOUS | Status: AC
  Filled 2017-07-21: qty 20

## 2017-07-21 MED ORDER — SUGAMMADEX SODIUM 200 MG/2ML IV SOLN
INTRAVENOUS | Status: DC | PRN
  Administered 2017-07-21: 200 mg via INTRAVENOUS

## 2017-07-21 MED ORDER — PHENYLEPHRINE HCL 10 MG/ML IJ SOLN
INTRAVENOUS | Status: DC | PRN
  Administered 2017-07-21: 50 ug/min via INTRAVENOUS

## 2017-07-21 MED ORDER — METRONIDAZOLE IN NACL 5-0.79 MG/ML-% IV SOLN
500.0000 mg | Freq: Once | INTRAVENOUS | Status: AC
Start: 2017-07-21 — End: 2017-07-21
  Administered 2017-07-21: 500 mg via INTRAVENOUS
  Filled 2017-07-21: qty 100

## 2017-07-21 MED ORDER — ALLOPURINOL 100 MG PO TABS
100.0000 mg | ORAL_TABLET | Freq: Every day | ORAL | Status: DC
  Administered 2017-07-22 – 2017-07-24 (×3): 100 mg via ORAL
  Filled 2017-07-21 (×3): qty 1

## 2017-07-21 MED ORDER — PROPOFOL 10 MG/ML IV BOLUS
INTRAVENOUS | Status: DC | PRN
  Administered 2017-07-21: 100 mg via INTRAVENOUS

## 2017-07-21 MED ORDER — MIDAZOLAM HCL 5 MG/5ML IJ SOLN
INTRAMUSCULAR | Status: DC | PRN
  Administered 2017-07-21: 2 mg via INTRAVENOUS

## 2017-07-21 MED ORDER — CARBAMAZEPINE ER 200 MG PO TB12
200.0000 mg | ORAL_TABLET | Freq: Every day | ORAL | Status: DC
  Administered 2017-07-22 – 2017-07-24 (×3): 200 mg via ORAL
  Filled 2017-07-21 (×6): qty 1

## 2017-07-21 MED ORDER — BUPIVACAINE-EPINEPHRINE 0.25% -1:200000 IJ SOLN
INTRAMUSCULAR | Status: AC
  Filled 2017-07-21: qty 1

## 2017-07-21 MED ORDER — HYDROMORPHONE HCL 1 MG/ML IJ SOLN
0.2500 mg | INTRAMUSCULAR | Status: DC | PRN
  Administered 2017-07-21 (×2): 0.5 mg via INTRAVENOUS

## 2017-07-21 MED ORDER — DEXTROSE 5 % IV SOLN
2.0000 g | Freq: Once | INTRAVENOUS | Status: AC
  Administered 2017-07-21: 2 g via INTRAVENOUS
  Filled 2017-07-21: qty 2

## 2017-07-21 MED ORDER — CANAGLIFLOZIN 100 MG PO TABS
100.0000 mg | ORAL_TABLET | Freq: Every day | ORAL | Status: DC
  Administered 2017-07-22 – 2017-07-24 (×3): 100 mg via ORAL
  Filled 2017-07-21 (×3): qty 1

## 2017-07-21 MED ORDER — ONDANSETRON 4 MG PO TBDP
ORAL_TABLET | ORAL | Status: AC
  Filled 2017-07-21: qty 1

## 2017-07-21 MED ORDER — ROCURONIUM BROMIDE 10 MG/ML (PF) SYRINGE
PREFILLED_SYRINGE | INTRAVENOUS | Status: DC | PRN
  Administered 2017-07-21: 10 mg via INTRAVENOUS
  Administered 2017-07-21: 30 mg via INTRAVENOUS

## 2017-07-21 MED ORDER — DIPHENHYDRAMINE HCL 25 MG PO CAPS: 25.0000 mg | ORAL_CAPSULE | Freq: Four times a day (QID) | ORAL | Status: DC | PRN

## 2017-07-21 MED ORDER — ONDANSETRON HCL 4 MG/2ML IJ SOLN
4.0000 mg | Freq: Once | INTRAMUSCULAR | Status: AC
  Administered 2017-07-21: 4 mg via INTRAVENOUS
  Filled 2017-07-21: qty 2

## 2017-07-21 MED ORDER — ACETAMINOPHEN 500 MG PO TABS
1000.0000 mg | ORAL_TABLET | Freq: Four times a day (QID) | ORAL | Status: DC
  Administered 2017-07-21 – 2017-07-24 (×11): 1000 mg via ORAL
  Filled 2017-07-21 (×11): qty 2

## 2017-07-21 MED ORDER — METRONIDAZOLE IN NACL 5-0.79 MG/ML-% IV SOLN
500.0000 mg | Freq: Three times a day (TID) | INTRAVENOUS | Status: DC
  Administered 2017-07-21 – 2017-07-24 (×9): 500 mg via INTRAVENOUS
  Filled 2017-07-21 (×11): qty 100

## 2017-07-21 MED ORDER — FENTANYL CITRATE (PF) 250 MCG/5ML IJ SOLN
INTRAMUSCULAR | Status: AC
  Filled 2017-07-21: qty 5

## 2017-07-21 MED ORDER — MORPHINE SULFATE (PF) 4 MG/ML IV SOLN
2.0000 mg | INTRAVENOUS | Status: DC | PRN
  Administered 2017-07-21: 4 mg via INTRAVENOUS
  Filled 2017-07-21: qty 1

## 2017-07-21 MED ORDER — ATORVASTATIN CALCIUM 80 MG PO TABS
80.0000 mg | ORAL_TABLET | Freq: Every day | ORAL | Status: DC
  Administered 2017-07-21 – 2017-07-23 (×3): 80 mg via ORAL
  Filled 2017-07-21 (×3): qty 1

## 2017-07-21 MED ORDER — HYDROMORPHONE HCL 1 MG/ML IJ SOLN
INTRAMUSCULAR | Status: AC
  Administered 2017-07-21: 0.5 mg via INTRAVENOUS
  Filled 2017-07-21: qty 1

## 2017-07-21 MED ORDER — LACTATED RINGERS IV SOLN
INTRAVENOUS | Status: DC | PRN
  Administered 2017-07-21 (×2): via INTRAVENOUS

## 2017-07-21 MED ORDER — SENNOSIDES-DOCUSATE SODIUM 8.6-50 MG PO TABS: 1.0000 | ORAL_TABLET | Freq: Every evening | ORAL | Status: DC | PRN

## 2017-07-21 MED ORDER — PHENYLEPHRINE 40 MCG/ML (10ML) SYRINGE FOR IV PUSH (FOR BLOOD PRESSURE SUPPORT)
PREFILLED_SYRINGE | INTRAVENOUS | Status: DC | PRN
  Administered 2017-07-21 (×2): 120 ug via INTRAVENOUS

## 2017-07-21 MED ORDER — KETOROLAC TROMETHAMINE 15 MG/ML IJ SOLN
15.0000 mg | Freq: Once | INTRAMUSCULAR | Status: AC
  Administered 2017-07-21: 15 mg via INTRAVENOUS
  Filled 2017-07-21: qty 1

## 2017-07-21 MED ORDER — DEXTROSE 5 % IV SOLN
2.0000 g | INTRAVENOUS | Status: DC
  Filled 2017-07-21: qty 2

## 2017-07-21 MED ORDER — TRAMADOL HCL 50 MG PO TABS
50.0000 mg | ORAL_TABLET | Freq: Four times a day (QID) | ORAL | Status: DC | PRN
Start: 2017-07-21 — End: 2017-07-24
  Administered 2017-07-22 – 2017-07-24 (×4): 50 mg via ORAL
  Filled 2017-07-21 (×4): qty 1

## 2017-07-21 MED ORDER — METRONIDAZOLE IN NACL 5-0.79 MG/ML-% IV SOLN
500.0000 mg | Freq: Three times a day (TID) | INTRAVENOUS | Status: DC
  Filled 2017-07-21: qty 100

## 2017-07-21 MED ORDER — HYDROMORPHONE HCL 1 MG/ML IJ SOLN: 0.2500 mg | INTRAMUSCULAR | Status: DC | PRN

## 2017-07-21 MED ORDER — DEXTROSE 5 % IV SOLN
2.0000 g | INTRAVENOUS | Status: DC
  Administered 2017-07-22 – 2017-07-23 (×2): 2 g via INTRAVENOUS
  Filled 2017-07-21 (×3): qty 2

## 2017-07-21 MED ORDER — LINAGLIPTIN 5 MG PO TABS
5.0000 mg | ORAL_TABLET | Freq: Every day | ORAL | Status: DC
  Administered 2017-07-21 – 2017-07-23 (×3): 5 mg via ORAL
  Filled 2017-07-21 (×3): qty 1

## 2017-07-21 MED ORDER — LIDOCAINE 2% (20 MG/ML) 5 ML SYRINGE
INTRAMUSCULAR | Status: AC
  Filled 2017-07-21: qty 10

## 2017-07-21 MED ORDER — ROCURONIUM BROMIDE 10 MG/ML (PF) SYRINGE
PREFILLED_SYRINGE | INTRAVENOUS | Status: AC
  Filled 2017-07-21: qty 5

## 2017-07-21 MED ORDER — ONDANSETRON 4 MG PO TBDP: 4.0000 mg | ORAL_TABLET | Freq: Four times a day (QID) | ORAL | Status: DC | PRN

## 2017-07-21 MED ORDER — INSULIN ASPART 100 UNIT/ML ~~LOC~~ SOLN
4.0000 [IU] | Freq: Three times a day (TID) | SUBCUTANEOUS | Status: DC
  Administered 2017-07-22 – 2017-07-24 (×6): 4 [IU] via SUBCUTANEOUS

## 2017-07-21 SURGICAL SUPPLY — 43 items
APPLIER CLIP ROT 10 11.4 M/L (STAPLE)
BENZOIN TINCTURE PRP APPL 2/3 (GAUZE/BANDAGES/DRESSINGS) ×2 IMPLANT
BLADE CLIPPER SURG (BLADE) IMPLANT
CANISTER SUCT 3000ML PPV (MISCELLANEOUS) ×2 IMPLANT
CHLORAPREP W/TINT 26ML (MISCELLANEOUS) ×4 IMPLANT
CLIP APPLIE ROT 10 11.4 M/L (STAPLE) IMPLANT
COVER SURGICAL LIGHT HANDLE (MISCELLANEOUS) ×2 IMPLANT
CUTTER FLEX LINEAR 45M (STAPLE) ×2 IMPLANT
DRSG TEGADERM 2-3/8X2-3/4 SM (GAUZE/BANDAGES/DRESSINGS) ×2 IMPLANT
DRSG TEGADERM 4X4.75 (GAUZE/BANDAGES/DRESSINGS) ×2 IMPLANT
ELECT REM PT RETURN 9FT ADLT (ELECTROSURGICAL) ×2
ELECTRODE REM PT RTRN 9FT ADLT (ELECTROSURGICAL) ×1 IMPLANT
ENDOLOOP SUT PDS II  0 18 (SUTURE)
ENDOLOOP SUT PDS II 0 18 (SUTURE) IMPLANT
FILTER SMOKE EVAC LAPAROSHD (FILTER) ×2 IMPLANT
GAUZE SPONGE 2X2 8PLY STRL LF (GAUZE/BANDAGES/DRESSINGS) ×1 IMPLANT
GLOVE BIO SURGEON STRL SZ7 (GLOVE) ×2 IMPLANT
GLOVE BIOGEL PI IND STRL 7.5 (GLOVE) ×1 IMPLANT
GLOVE BIOGEL PI INDICATOR 7.5 (GLOVE) ×1
GOWN STRL REUS W/ TWL LRG LVL3 (GOWN DISPOSABLE) ×3 IMPLANT
GOWN STRL REUS W/TWL LRG LVL3 (GOWN DISPOSABLE) ×3
KIT BASIN OR (CUSTOM PROCEDURE TRAY) ×2 IMPLANT
KIT ROOM TURNOVER OR (KITS) ×2 IMPLANT
NS IRRIG 1000ML POUR BTL (IV SOLUTION) ×2 IMPLANT
PAD ARMBOARD 7.5X6 YLW CONV (MISCELLANEOUS) ×4 IMPLANT
POUCH SPECIMEN RETRIEVAL 10MM (ENDOMECHANICALS) ×2 IMPLANT
RELOAD STAPLE TA45 3.5 REG BLU (ENDOMECHANICALS) ×2 IMPLANT
SCISSORS LAP 5X35 DISP (ENDOMECHANICALS) ×2 IMPLANT
SET IRRIG TUBING LAPAROSCOPIC (IRRIGATION / IRRIGATOR) ×2 IMPLANT
SHEARS HARMONIC ACE PLUS 36CM (ENDOMECHANICALS) ×2 IMPLANT
SLEEVE ENDOPATH XCEL 5M (ENDOMECHANICALS) ×2 IMPLANT
SOLUTION ANTI FOG 6CC (MISCELLANEOUS) ×2 IMPLANT
SPECIMEN JAR SMALL (MISCELLANEOUS) ×2 IMPLANT
SPONGE GAUZE 2X2 STER 10/PKG (GAUZE/BANDAGES/DRESSINGS) ×1
STRIP CLOSURE SKIN 1/2X4 (GAUZE/BANDAGES/DRESSINGS) ×2 IMPLANT
SUT MNCRL AB 4-0 PS2 18 (SUTURE) ×2 IMPLANT
TOWEL OR 17X24 6PK STRL BLUE (TOWEL DISPOSABLE) ×2 IMPLANT
TOWEL OR 17X26 10 PK STRL BLUE (TOWEL DISPOSABLE) ×2 IMPLANT
TRAY LAPAROSCOPIC MC (CUSTOM PROCEDURE TRAY) ×2 IMPLANT
TROCAR 5M 150ML BLDLS (TROCAR) ×2 IMPLANT
TROCAR XCEL BLUNT TIP 100MML (ENDOMECHANICALS) ×2 IMPLANT
TROCAR XCEL NON-BLD 5MMX100MML (ENDOMECHANICALS) ×2 IMPLANT
TUBING INSUFFLATION (TUBING) ×2 IMPLANT

## 2017-07-21 NOTE — ED Triage Notes (Signed)
Pt presents with lower abd pain and pelvic pain that began 2300 last night; pt denies n/v/d, constipation, bloating or vaginal discharge, also denies hematuria or dysuria

## 2017-07-21 NOTE — OR Nursing (Signed)
In process of expediting case  room turn over for surgeon to start next case   I forgot to complete chart and had to return and complete it and reverify it

## 2017-07-21 NOTE — Transfer of Care (Signed)
Immediate Anesthesia Transfer of Care Note  Patient: Dawn Rice  Procedure(s) Performed: Procedure(s): APPENDECTOMY LAPAROSCOPIC (N/A)  Patient Location: PACU  Anesthesia Type:General  Level of Consciousness: awake, alert , oriented and patient cooperative  Airway & Oxygen Therapy: Patient Spontanous Breathing and Patient connected to nasal cannula oxygen  Post-op Assessment: Report given to RN, Post -op Vital signs reviewed and stable and Patient moving all extremities  Post vital signs: Reviewed and stable  Last Vitals:  Vitals:   07/21/17 0745 07/21/17 0801  BP: 124/81   Pulse: (!) 118   Resp: 16   Temp:  (!) 38.8 C    Last Pain:  Vitals:   07/21/17 0801  TempSrc: Oral  PainSc:          Complications: No apparent anesthesia complications

## 2017-07-21 NOTE — ED Notes (Signed)
Kendal HymenBonnie, RN made aware of temp in report, agrees to notify doctor.

## 2017-07-21 NOTE — Anesthesia Procedure Notes (Signed)
Procedure Name: Intubation Date/Time: 07/21/2017 9:05 AM Performed by: Myna Bright Pre-anesthesia Checklist: Patient identified, Emergency Drugs available, Suction available and Patient being monitored Patient Re-evaluated:Patient Re-evaluated prior to induction Oxygen Delivery Method: Circle system utilized Preoxygenation: Pre-oxygenation with 100% oxygen Induction Type: IV induction, Rapid sequence and Cricoid Pressure applied Laryngoscope Size: Mac and 3 Grade View: Grade I Tube type: Oral Tube size: 7.0 mm Number of attempts: 1 Airway Equipment and Method: Stylet Placement Confirmation: ETT inserted through vocal cords under direct vision,  positive ETCO2 and breath sounds checked- equal and bilateral Secured at: 20 cm Tube secured with: Tape Dental Injury: Teeth and Oropharynx as per pre-operative assessment

## 2017-07-21 NOTE — ED Notes (Signed)
Belongings sent with family.  

## 2017-07-21 NOTE — Anesthesia Postprocedure Evaluation (Signed)
Anesthesia Post Note  Patient: Pennie BanterLinda F Piazza  Procedure(s) Performed: Procedure(s) (LRB): APPENDECTOMY LAPAROSCOPIC (N/A)     Patient location during evaluation: PACU Anesthesia Type: General Level of consciousness: awake and alert Pain management: pain level controlled Vital Signs Assessment: post-procedure vital signs reviewed and stable Respiratory status: spontaneous breathing, nonlabored ventilation and respiratory function stable Cardiovascular status: blood pressure returned to baseline and stable Postop Assessment: no signs of nausea or vomiting Anesthetic complications: no    Last Vitals:  Vitals:   07/21/17 1154 07/21/17 1206  BP: 109/70 125/81  Pulse: (!) 121 (!) 117  Resp: 18 14  Temp:      Last Pain:  Vitals:   07/21/17 1052  TempSrc:   PainSc: 6                  Jakeob Tullis,W. EDMOND

## 2017-07-21 NOTE — Op Note (Signed)
Appendectomy, Lap, Procedure Note  Indications: The patient presented with a 12-hour history of right-sided abdominal pain. A CT scan revealed findings consistent with acute appendicitis without obvious signs of perforation.  WBC was normal.  Pre-operative Diagnosis: Acute appendicitis without mention of peritonitis  Post-operative Diagnosis: Acute appendicitis with generalized peritonitis  Surgeon: Wynona LunaSUEI,Kenston Longton K.   Assistants: none  Anesthesia: General endotracheal anesthesia  ASA Class: 2E  Procedure Details  The patient was seen again in the Holding Room. The risks, benefits, complications, treatment options, and expected outcomes were discussed with the patient and/or family. The possibilities of reaction to medication, perforation of viscus, bleeding, recurrent infection, finding a normal appendix, the need for additional procedures, failure to diagnose a condition, and creating a complication requiring transfusion or operation were discussed. There was concurrence with the proposed plan and informed consent was obtained. The site of surgery was properly noted. The patient was taken to Operating Room, identified as Dawn Rice and the procedure verified as Appendectomy. A Time Out was held and the above information confirmed.  The patient was placed in the supine position and general anesthesia was induced.  The abdomen was prepped and draped in a sterile fashion. She has had previous laparoscopic surgery and has two scars around her umbilicus.  A one centimeter supraumbilical incision was made.  Dissection was carried down to the fascia bluntly.  The fascia was incised vertically.  We entered the peritoneal cavity bluntly.   There are omental adhesions around the umbilicus.   A pursestring suture was passed around the incision with a 0 Vicryl.  The Hasson cannula was introduced into the abdomen and the tails of the suture were used to hold the Hasson in place.   The  pneumoperitoneum was then established maintaining a maximum pressure of 15 mmHg.  The laparoscope was inserted.  We encountered some omental adhesions but were able to find some clear space in the right side of the abdomen and in the LLQ.  Additional 5 mm cannulas then placed in the left lower quadrant of the abdomen and the right upper quadrant under direct visualization. A careful evaluation of the entire abdomen was carried out. There is some obvious peritonitis in the pelvis and RLQ.  The patient was placed in Trendelenburg and left lateral decubitus position.  The scope was moved to the right upper quadrant port site. The cecum was mobilized medially.  The appendix was identified.  It was erythematous but I could not see any signs of gross perforation.  However, there was purulent fluid in the RLQ and in the pelvis.  The appendix was carefully dissected. The appendix was was skeletonized with the harmonic scalpel.   The appendix was divided at its base using an endo-GIA stapler. Minimal appendiceal stump was left in place. There was no evidence of bleeding, leakage, or complication after division of the appendix. Irrigation was also performed and irrigate suctioned from the abdomen as well.  We inspected the small bowel from cecum to mid-jejunum and saw no sign of Meckel's diverticulum or other sources of infection.  The cecum, sigmoid colon, and rectum appeared normal.  We irrigated until no further purulent fluid was noted.  The umbilical port site was closed with the pursestring suture. There was no residual palpable fascial defect.  The trocar site skin wounds were closed with 4-0 Monocryl.  Instrument, sponge, and needle counts were correct at the conclusion of the case.   Findings: The appendix was found to be inflamed. There  were not signs of necrosis.  There was not perforation. There was not abscess formation.  Estimated Blood Loss:  less than 50 mL         Drains: none         Specimens:  Appendix         Complications:  None; patient tolerated the procedure well.         Disposition: PACU - hemodynamically stable.         Condition: stable

## 2017-07-21 NOTE — H&P (Signed)
Dawn Rice is an 61 y.o. female.   Chief Complaint: RLQ abdominal pain HPI: This is a 61 yo female with DM, HTN, obesity who presents with acute onset of lower abdominal pain concentrated in her RLQ since 11 PM last night.  No recent illnesses.  Some nausea.  No vomiting or diarrhea.  Previous hysterectomy and cholecystectomy.  She presented to the ED for evaluation and was found to have acute appendicitis.  Past Medical History:  Diagnosis Date  . Diabetes mellitus   . Epilepsy ([place])   . Gout   . H/O: hysterectomy   . Hyperlipidemia   . Hypertension   . S/P cholecystectomy     Past Surgical History:  Procedure Laterality Date  . ABDOMINAL HYSTERECTOMY    . CHOLECYSTECTOMY      Family History  Problem Relation Age of Onset  . Diabetes Mother   . Hypertension Mother   . Heart failure Mother   . Diabetes Father   . Coronary artery disease Father    Social History:  reports that she has quit smoking. Her smoking use included Cigarettes. She has never used smokeless tobacco. She reports that she drinks alcohol. She reports that she does not use drugs.  Allergies:  Allergies  Allergen Reactions  . Penicillins Itching    Has patient had a PCN reaction causing immediate rash, facial/tongue/throat swelling, SOB or lightheadedness with hypotension: No Has patient had a PCN reaction causing severe rash involving mucus membranes or skin necrosis: No Has patient had a PCN reaction that required hospitalization No Has patient had a PCN reaction occurring within the last 10 years: No If all of the above answers are "NO", then may proceed with Cephalosporin use.   Prior to Admission medications   Medication Sig Start Date End Date Taking? Authorizing Provider  allopurinol (ZYLOPRIM) 100 MG tablet Take 100 mg by mouth daily.   Yes [provider]  amLODipine (NORVASC) 2.5 MG tablet Take 2 tablets (5 mg total) by mouth daily. 01/20/17  Yes Dhungel, Nishant, MD   atorvastatin (LIPITOR) 80 MG tablet Take 80 mg by mouth daily. 06/26/15  Yes [provider]  carbamazepine (CARBATROL) 200 MG 12 hr capsule Take 200 mg by mouth daily. 08/19/15  Yes [provider]  dapagliflozin propanediol (FARXIGA) 5 MG TABS tablet Take 5 mg by mouth daily.   Yes [provider]  lisinopril (PRINIVIL,ZESTRIL) 40 MG tablet Take 40 mg by mouth daily. 07/05/15  Yes [provider]  metoprolol tartrate (LOPRESSOR) 25 MG tablet Take 25 mg by mouth 2 (two) times daily.   Yes [provider]  SitaGLIPtin-MetFORMIN HCl (JANUMET XR) [phone] MG TB24 Take 1 tablet by mouth daily with supper.   Yes [provider]     Results for orders placed or performed during the hospital encounter of 07/21/17 (from the past 48 hour(s))  Lipase, blood     Status: None   Collection Time: 07/21/17  2:31 AM  Result Value Ref Range   Lipase 29 11 - 51 U/L  Comprehensive metabolic panel     Status: Abnormal   Collection Time: 07/21/17  2:31 AM  Result Value Ref Range   Sodium 140 135 - 145 mmol/L   Potassium 4.0 3.5 - 5.1 mmol/L   Chloride 107 101 - 111 mmol/L   CO2 23 22 - 32 mmol/L   Glucose, Bld 120 (H) 65 - 99 mg/dL   BUN 12 6 - 20 mg/dL   Creatinine, Ser  0.90 0.44 - 1.00 mg/dL   Calcium 9.4 8.9 - 10.3 mg/dL   Total Protein 6.8 6.5 - 8.1 g/dL   Albumin 3.7 3.5 - 5.0 g/dL   AST 28 15 - 41 U/L   ALT 16 14 - 54 U/L   Alkaline Phosphatase 99 38 - 126 U/L   Total Bilirubin 0.3 0.3 - 1.2 mg/dL   GFR calc non Af Amer >60 >60 mL/min   GFR calc Af Amer >60 >60 mL/min    Comment: (NOTE) The eGFR has been calculated using the CKD EPI equation. This calculation has not been validated in all clinical situations. eGFR's persistently <60 mL/min signify possible Chronic Kidney Disease.    Anion gap 10 5 - 15  CBC     Status: Abnormal   Collection Time: 07/21/17  2:31 AM  Result Value Ref Range   WBC 7.7 4.0 - 10.5 K/uL   RBC 4.87 3.87 - 5.11  MIL/uL   Hemoglobin 11.3 (L) 12.0 - 15.0 g/dL   HCT 36.3 36.0 - 46.0 %   MCV 74.5 (L) 78.0 - 100.0 fL   MCH 23.2 (L) 26.0 - 34.0 pg   MCHC 31.1 30.0 - 36.0 g/dL   RDW 17.9 (H) 11.5 - 15.5 %   Platelets 376 150 - 400 K/uL  Urinalysis, Routine w reflex microscopic     Status: Abnormal   Collection Time: 07/21/17  2:46 AM  Result Value Ref Range   Color, Urine YELLOW YELLOW   APPearance HAZY (A) CLEAR   Specific Gravity, Urine 1.009 1.005 - 1.030   pH 5.0 5.0 - 8.0   Glucose, UA >=500 (A) NEGATIVE mg/dL   Hgb urine dipstick NEGATIVE NEGATIVE   Bilirubin Urine NEGATIVE NEGATIVE   Ketones, ur NEGATIVE NEGATIVE mg/dL   Protein, ur NEGATIVE NEGATIVE mg/dL   Nitrite NEGATIVE NEGATIVE   Leukocytes, UA LARGE (A) NEGATIVE   RBC / HPF 0-5 0 - 5 RBC/hpf   WBC, UA TOO NUMEROUS TO COUNT 0 - 5 WBC/hpf   Bacteria, UA RARE (A) NONE SEEN   Squamous Epithelial / LPF 0-5 (A) NONE SEEN   Hyaline Casts, UA PRESENT    Ct Abdomen Pelvis W Contrast  Result Date: 07/21/2017 CLINICAL DATA:  Lower abdominal pain, onset at 23:00. EXAM: CT ABDOMEN AND PELVIS WITH CONTRAST TECHNIQUE: Multidetector CT imaging of the abdomen and pelvis was performed using the standard protocol following bolus administration of intravenous contrast. CONTRAST:  180m ISOVUE-300 IOPAMIDOL (ISOVUE-300) INJECTION 61% COMPARISON:  None. FINDINGS: Lower chest: No acute abnormality. Hepatobiliary: Mild fatty infiltration of the liver. No focal liver abnormality is seen. Status post cholecystectomy. No biliary dilatation. Pancreas: Unremarkable. No pancreatic ductal dilatation or surrounding inflammatory changes. Spleen: Normal in size without focal abnormality. Adrenals/Urinary Tract: Adrenal glands are unremarkable. Kidneys are normal, without renal calculi, focal lesion, or hydronephrosis. Bladder is unremarkable. Stomach/Bowel: Stomach and small bowel are normal. There is acute inflammation surrounding the appendix.  No abscess. Remainder of  the colon is unremarkable. Vascular/Lymphatic: The abdominal aorta is normal in caliber with mild atherosclerotic calcification. No adenopathy in the abdomen or pelvis. Reproductive: Status post hysterectomy. No adnexal masses. Other: Small volume ascites. Musculoskeletal: No significant skeletal lesion. IMPRESSION: Acute appendicitis. These results were called by telephone at the time of interpretation on 07/21/2017 at 6:30 am to Dr. WMargaretmary Dys, who verbally acknowledged these results. Electronically Signed   By: DAndreas NewportM.D.   On: 07/21/2017 06:37    Review of Systems  Constitutional:  Negative for weight loss.  HENT: Negative for ear discharge, ear pain, hearing loss and tinnitus.   Eyes: Negative for blurred vision, double vision, photophobia and pain.  Respiratory: Negative for cough, sputum production and shortness of breath.   Cardiovascular: Negative for chest pain.  Gastrointestinal: Positive for abdominal pain and nausea. Negative for vomiting.  Genitourinary: Negative for dysuria, flank pain, frequency and urgency.  Musculoskeletal: Negative for back pain, falls, joint pain, myalgias and neck pain.  Neurological: Negative for dizziness, tingling, sensory change, focal weakness, loss of consciousness and headaches.  Endo/Heme/Allergies: Does not bruise/bleed easily.  Psychiatric/Behavioral: Negative for depression, memory loss and substance abuse. The patient is not nervous/anxious.     Blood pressure (!) 145/90, pulse (!) 117, temperature 97.9 F (36.6 C), temperature source Oral, resp. rate 16, height '5\' 2"'  (1.575 m), weight 88 kg (194 lb), SpO2 96 %. Physical Exam  WDWN in NAD Eyes:  Pupils equal, round; sclera anicteric HENT:  Oral mucosa moist; good dentition  Neck:  No masses palpated, no thyromegaly Lungs:  CTA bilaterally; normal respiratory effort CV:  Regular rate and rhythm; no murmurs; extremities well-perfused with no edema Abd:  +bowel sounds, soft, obese;  tender in RLQ; some guarding; no generalized peritonitis Skin:  Warm, dry; no sign of jaundice Psychiatric - alert and oriented x 4; calm mood and affect  Assessment/Plan Acute appendicitis  Laparoscopic appendectomy this morning.  The surgical procedure has been discussed with the patient.  Potential risks, benefits, alternative treatments, and expected outcomes have been explained.  All of the patient's questions at this time have been answered.  The likelihood of reaching the patient's treatment goal is good.  The patient understand the proposed surgical procedure and wishes to proceed.   Maia Petties., MD 07/21/2017, 7:43 AM

## 2017-07-21 NOTE — ED Notes (Signed)
Patient transported to CT 

## 2017-07-21 NOTE — Anesthesia Preprocedure Evaluation (Addendum)
Anesthesia Evaluation  Patient identified by MRN, date of birth, ID band Patient awake    Reviewed: Allergy & Precautions, H&P , NPO status , Patient's Chart, lab work & pertinent test results  Airway Mallampati: II  TM Distance: >3 FB Neck ROM: Full    Dental no notable dental hx. (+) Poor Dentition, Dental Advisory Given   Pulmonary neg pulmonary ROS, former smoker,    Pulmonary exam normal breath sounds clear to auscultation       Cardiovascular hypertension, Pt. on medications  Rhythm:Regular Rate:Normal     Neuro/Psych Seizures -, Well Controlled,  negative psych ROS   GI/Hepatic negative GI ROS, Neg liver ROS,   Endo/Other  diabetes, Type 2, Oral Hypoglycemic AgentsMorbid obesity  Renal/GU negative Renal ROS  negative genitourinary   Musculoskeletal   Abdominal   Peds  Hematology negative hematology ROS (+)   Anesthesia Other Findings   Reproductive/Obstetrics negative OB ROS                            Anesthesia Physical Anesthesia Plan  ASA: III  Anesthesia Plan: General   Post-op Pain Management:    Induction: Intravenous  PONV Risk Score and Plan: 4 or greater and Ondansetron, Dexamethasone and Midazolam  Airway Management Planned: Oral ETT  Additional Equipment:   Intra-op Plan:   Post-operative Plan: Extubation in OR  Informed Consent: I have reviewed the patients History and Physical, chart, labs and discussed the procedure including the risks, benefits and alternatives for the proposed anesthesia with the patient or authorized representative who has indicated his/her understanding and acceptance.   Dental advisory given  Plan Discussed with: CRNA  Anesthesia Plan Comments:        Anesthesia Quick Evaluation

## 2017-07-21 NOTE — Progress Notes (Signed)
Pt tol ice chips well- pt states this happens to her at home where she gets phlegm in her throat & needs to cough it up from a post nasal drip - pt denies SOB or pain at this time

## 2017-07-21 NOTE — ED Provider Notes (Signed)
[facility]-EMERGENCY DEPT Provider Note   CSN: [id number] Arrival date & time: 07/21/17  0224     History   Chief Complaint Chief Complaint  Patient presents with  . Pelvic Pain  . Groin Pain  . Abdominal Pain    HPI Dawn BanterLinda F Seltzer is a 61 y.o. female.  Patient presents with severe lower abdominal pain associated with nausea that started around 11:00 pm last night. No vomiting, diarrhea or constipation at the onset of symptoms. Later, after arrival in the ED, she started having vomiting. She denies vagianl discharge, urinary symptoms or flank pain. No history of similar pain in the past. She describes the pain as sharp, intense and constant.    The history is provided by the patient. No language interpreter was used.  Pelvic Pain  Associated symptoms include abdominal pain. Pertinent negatives include no chest pain and no shortness of breath.  Groin Pain  Associated symptoms include abdominal pain. Pertinent negatives include no chest pain and no shortness of breath.  Abdominal Pain   Associated symptoms include nausea. Pertinent negatives include fever and dysuria.    Past Medical History:  Diagnosis Date  . Diabetes mellitus   . Epilepsy ([facility])   . Gout   . H/O: hysterectomy   . Hyperlipidemia   . Hypertension   . S/P cholecystectomy     Patient Active Problem List   Diagnosis Date Noted  . Essential hypertension, benign   . Chest pain 01/19/2017  . Hypertension 01/19/2017  . Hyperlipidemia 01/19/2017  . Diabetes ([facility]) 01/19/2017  . Seizures ([facility]) 01/19/2017  . Gout     Past Surgical History:  Procedure Laterality Date  . ABDOMINAL HYSTERECTOMY    . CHOLECYSTECTOMY      OB History    No data available       Home Medications    Prior to Admission medications   Medication Sig Start Date End Date Taking? Authorizing Provider  allopurinol (ZYLOPRIM) 100 MG tablet Take 100 mg by mouth daily.   Yes [provider]  amLODipine (NORVASC) 2.5 MG  tablet Take 2 tablets (5 mg total) by mouth daily. 01/20/17  Yes Dhungel, Nishant, MD  atorvastatin (LIPITOR) 80 MG tablet Take 80 mg by mouth daily. 06/26/15  Yes [provider]  carbamazepine (CARBATROL) 200 MG 12 hr capsule Take 200 mg by mouth daily. 08/19/15  Yes [provider]  dapagliflozin propanediol (FARXIGA) 5 MG TABS tablet Take 5 mg by mouth daily.   Yes [provider]  lisinopril (PRINIVIL,ZESTRIL) 40 MG tablet Take 40 mg by mouth daily. 07/05/15  Yes [provider]  metoprolol tartrate (LOPRESSOR) 25 MG tablet Take 25 mg by mouth 2 (two) times daily.   Yes [provider]  SitaGLIPtin-MetFORMIN HCl (JANUMET XR) [phone] MG TB24 Take 1 tablet by mouth daily with supper.   Yes [provider]    Family History Family History  Problem Relation Age of Onset  . Diabetes Mother   . Hypertension Mother   . Heart failure Mother   . Diabetes Father   . Coronary artery disease Father     Social History Social History  Substance Use Topics  . Smoking status: Former Smoker    Types: Cigarettes  . Smokeless tobacco: Never Used  . Alcohol use Yes     Allergies   Penicillins   Review of Systems Review of Systems  Constitutional: Negative for chills and fever.  HENT: Negative.   Respiratory: Negative.  Negative for shortness  of breath.   Cardiovascular: Negative.  Negative for chest pain.  Gastrointestinal: Positive for abdominal pain and nausea.  Genitourinary: Positive for pelvic pain. Negative for dysuria and vaginal discharge.  Musculoskeletal: Negative.  Negative for back pain.  Skin: Negative.   Neurological: Negative.      Physical Exam Updated Vital Signs BP (!) 159/118 (BP Location: Right Arm)   Pulse 95   Temp 97.9 F (36.6 C) (Oral)   Resp (!) 22   Ht 5\' 2"  (1.575 m)   Wt 88 kg (194 lb)   SpO2 100%   BMI 35.48 kg/m   Physical Exam  Constitutional: She is oriented to person, place, and time. She  appears well-developed and well-nourished.  Uncomfortable appearing, unable to lie flat due to pain.  HENT:  Head: Normocephalic.  Neck: Normal range of motion. Neck supple.  Cardiovascular: Normal rate and regular rhythm.   Pulmonary/Chest: Effort normal and breath sounds normal. She has no wheezes. She has no rales.  Abdominal: Soft. Bowel sounds are normal. There is tenderness (RLQ tenderness with guarding.). There is guarding. There is no rebound.  Musculoskeletal: Normal range of motion.  Neurological: She is alert and oriented to person, place, and time.  Skin: Skin is warm and dry. No rash noted.  Psychiatric: She has a normal mood and affect.     ED Treatments / Results  Labs (all labs ordered are listed, but only abnormal results are displayed) Labs Reviewed  COMPREHENSIVE METABOLIC PANEL - Abnormal; Notable for the following:       Result Value   Glucose, Bld 120 (*)    All other components within normal limits  CBC - Abnormal; Notable for the following:    Hemoglobin 11.3 (*)    MCV 74.5 (*)    MCH 23.2 (*)    RDW 17.9 (*)    All other components within normal limits  URINALYSIS, ROUTINE W REFLEX MICROSCOPIC - Abnormal; Notable for the following:    APPearance HAZY (*)    Glucose, UA >=500 (*)    Leukocytes, UA LARGE (*)    Bacteria, UA RARE (*)    Squamous Epithelial / LPF 0-5 (*)    All other components within normal limits  LIPASE, BLOOD    EKG  EKG Interpretation None       Radiology No results found.  Procedures Procedures (including critical care time)  Medications Ordered in ED Medications  ondansetron (ZOFRAN-ODT) 4 MG disintegrating tablet (not administered)  ondansetron (ZOFRAN-ODT) disintegrating tablet 4 mg (4 mg Oral Given 07/21/17 0245)  morphine 4 MG/ML injection 4 mg (4 mg Intravenous Given 07/21/17 0457)  ondansetron (ZOFRAN) injection 4 mg (4 mg Intravenous Given 07/21/17 0457)     Initial Impression / Assessment and Plan / ED  Course  I have reviewed the triage vital signs and the nursing notes.  Pertinent labs & imaging results that were available during my care of the patient were reviewed by me and considered in my medical decision making (see chart for details).     Patient presents with severe lower abdominal pain starting within 3 hours of arrival. No fever, no history of the same.   The patient is uncomfortable appearing. VSS, hypertensive. Will give IV pain medication. Labs are reassuring. UA shows TNTC WBC's but rare bacteria. The patient denies urinary symptoms.   Given degree of pain will obtain a CT of the abdomen. Pain medications given. Will continue to observe.   Pain controlled with IV medications. Patient  kept NPO.  CT scan shows uncomplicated appendicitis - no abscess or perforation. General surgery paged for admission.   Final Clinical Impressions(s) / ED Diagnoses   Final diagnoses:  None   1. Acute appendicitis  New Prescriptions New Prescriptions   No medications on file     Elpidio Anis, Cordelia Poche 07/21/17 1610    Zadie Rhine, MD 07/22/17 [phone]

## 2017-07-21 NOTE — Progress Notes (Signed)
Call to Dr Aleene DavidsonE Fitzgerald -updated on pt -HR remains ST - 112-120 ( same as pre-op status) pt has been medicated for pain ( Dilaudid as noted), denies pain at this time, Bladder scan shows between 200-250 ml- pt denies urge to void - neuro status intact- pt denies SOB

## 2017-07-21 NOTE — Progress Notes (Addendum)
Received pt from PACU, lethargic, responsive to tactile stimuli.  1345 Pt c/o mid chest pain "5"/10. V/S checked, Dr Corliss Skainssuei notified. EKG done, sinus tach at 110. 1430 Pt is feeling better after the toradol was given. More awake now.

## 2017-07-22 ENCOUNTER — Encounter (HOSPITAL_COMMUNITY): Payer: Self-pay | Admitting: Surgery

## 2017-07-22 DIAGNOSIS — I10 Essential (primary) hypertension: Secondary | ICD-10-CM | POA: Diagnosis not present

## 2017-07-22 DIAGNOSIS — Z880 Allergy status to penicillin: Secondary | ICD-10-CM | POA: Diagnosis not present

## 2017-07-22 DIAGNOSIS — K352 Acute appendicitis with generalized peritonitis: Secondary | ICD-10-CM | POA: Diagnosis not present

## 2017-07-22 DIAGNOSIS — Z833 Family history of diabetes mellitus: Secondary | ICD-10-CM | POA: Diagnosis not present

## 2017-07-22 DIAGNOSIS — E785 Hyperlipidemia, unspecified: Secondary | ICD-10-CM | POA: Diagnosis not present

## 2017-07-22 DIAGNOSIS — Z9049 Acquired absence of other specified parts of digestive tract: Secondary | ICD-10-CM | POA: Diagnosis not present

## 2017-07-22 DIAGNOSIS — Z6835 Body mass index (BMI) 35.0-35.9, adult: Secondary | ICD-10-CM | POA: Diagnosis not present

## 2017-07-22 DIAGNOSIS — Z8249 Family history of ischemic heart disease and other diseases of the circulatory system: Secondary | ICD-10-CM | POA: Diagnosis not present

## 2017-07-22 DIAGNOSIS — M109 Gout, unspecified: Secondary | ICD-10-CM | POA: Diagnosis not present

## 2017-07-22 DIAGNOSIS — Z79899 Other long term (current) drug therapy: Secondary | ICD-10-CM | POA: Diagnosis not present

## 2017-07-22 DIAGNOSIS — Z7984 Long term (current) use of oral hypoglycemic drugs: Secondary | ICD-10-CM | POA: Diagnosis not present

## 2017-07-22 DIAGNOSIS — E119 Type 2 diabetes mellitus without complications: Secondary | ICD-10-CM | POA: Diagnosis not present

## 2017-07-22 DIAGNOSIS — N179 Acute kidney failure, unspecified: Secondary | ICD-10-CM | POA: Diagnosis not present

## 2017-07-22 DIAGNOSIS — Z87891 Personal history of nicotine dependence: Secondary | ICD-10-CM | POA: Diagnosis not present

## 2017-07-22 DIAGNOSIS — Z90710 Acquired absence of both cervix and uterus: Secondary | ICD-10-CM | POA: Diagnosis not present

## 2017-07-22 DIAGNOSIS — G40909 Epilepsy, unspecified, not intractable, without status epilepticus: Secondary | ICD-10-CM | POA: Diagnosis not present

## 2017-07-22 DIAGNOSIS — K353 Acute appendicitis with localized peritonitis: Secondary | ICD-10-CM | POA: Diagnosis present

## 2017-07-22 LAB — GLUCOSE, CAPILLARY
GLUCOSE-CAPILLARY: 78 mg/dL (ref 65–99)
Glucose-Capillary: 136 mg/dL — ABNORMAL HIGH (ref 65–99)
Glucose-Capillary: 102 mg/dL — ABNORMAL HIGH (ref 65–99)
Glucose-Capillary: 84 mg/dL (ref 65–99)

## 2017-07-22 LAB — BASIC METABOLIC PANEL
Anion gap: 7 (ref 5–15)
BUN: 15 mg/dL (ref 6–20)
CALCIUM: 8.3 mg/dL — AB (ref 8.9–10.3)
CO2: 25 mmol/L (ref 22–32)
CREATININE: 1.05 mg/dL — AB (ref 0.44–1.00)
Chloride: 110 mmol/L (ref 101–111)
GFR calc Af Amer: >60 mL/min (ref >60)
GFR calc non Af Amer: 57 mL/min — ABNORMAL LOW (ref >60)
GLUCOSE: 119 mg/dL — AB (ref 65–99)
Potassium: 3.9 mmol/L (ref 3.5–5.1)
Sodium: 142 mmol/L (ref 135–145)

## 2017-07-22 LAB — CBC
HCT: 30.3 % — ABNORMAL LOW (ref 36.0–46.0)
Hemoglobin: 9.3 g/dL — ABNORMAL LOW (ref 12.0–15.0)
MCH: 23.2 pg — AB (ref 26.0–34.0)
MCHC: 30.7 g/dL (ref 30.0–36.0)
MCV: 75.6 fL — ABNORMAL LOW (ref 78.0–100.0)
PLATELETS: 281 10*3/uL (ref 150–400)
RBC: 4.01 MIL/uL (ref 3.87–5.11)
RDW: 18.7 % — ABNORMAL HIGH (ref 11.5–15.5)
WBC: 15.4 10*3/uL — ABNORMAL HIGH (ref 4.0–10.5)

## 2017-07-22 MED ORDER — WHITE PETROLATUM GEL
Status: AC
  Filled 2017-07-22: qty 1

## 2017-07-22 NOTE — Discharge Instructions (Signed)
Laparoscopic Appendectomy, Adult, Care After °These instructions give you information about caring for yourself after your procedure. Your doctor may also give you more specific instructions. Call your doctor if you have any problems or questions after your procedure. °Follow these instructions at home: °Medicines °· Take over-the-counter and prescription medicines only as told by your doctor. °· Do not drive for 24 hours if you received a sedative. °· Do not drive or use heavy machinery while taking prescription pain medicine. °· If you were prescribed an antibiotic medicine, take it as told by your doctor. Do not stop taking it even if you start to feel better. °Activity °· Do not lift anything that is heavier than 10 pounds (4.5 kg) for 3 weeks or as told by your doctor. °· Do not play contact sports for 3 weeks or as told by your doctor. °· Slowly return to your normal activities. °Bathing °· Keep your cuts from surgery (incisions) clean and dry. °? Gently wash the cuts with soap and water. °? Rinse the cuts with water until the soap is gone. °? Pat the cuts dry with a clean towel. Do not rub the cuts. °· You may take showers after 48 hours. °· Do not take baths, swim, or use a hot tub for 2 weeks or as told by your doctor. °Cut Care °· Follow instructions from your doctor about how to take care of your cuts. Make sure you: °? Wash your hands with soap and water before you change your bandage (dressing). If you do not have soap and water, use hand sanitizer. °? Change your bandage as told by your doctor. °? Leave stitches (sutures), skin glue, or skin tape (adhesive) strips in place. They may need to stay in place for 2 weeks or longer. If tape strips get loose and curl up, you may trim the loose edges. Do not remove tape strips completely unless your doctor says it is okay. °· Check your cuts every day for signs of infection. Check for: °? More redness, swelling, or pain. °? More fluid or  blood. °? Warmth. °? Pus or a bad smell. °Other Instructions °· If you were sent home with a drain, follow instructions from your doctor about how to use it and care for it. °· Take deep breaths. This helps to keep your lungs from getting swollen (inflamed). °· To help with constipation: °? Drink plenty of fluids. °? Eat plenty of fruits and vegetables. °· Keep all follow-up visits as told by your doctor. This is important. °Contact a doctor if: °· You have more redness, swelling, or pain around a cut from surgery. °· You have more fluid or blood coming from a cut. °· Your cut feels warm to the touch. °· You have pus or a bad smell coming from a cut or a bandage. °· The edges of a cut break open after the stitches have been taken out. °· You have pain in your shoulders that gets worse. °· You feel dizzy or you pass out (faint). °· You have shortness of breath. °· You keep feeling sick to your stomach (nauseous). °· You keep throwing up (vomiting). °· You get diarrhea or you cannot control your poop. °· You lose your appetite. °· You have swelling or pain in your legs. °Get help right away if: °· You have a fever. °· You get a rash. °· You have trouble breathing. °· You have sharp pains in your chest. °This information is not intended to replace advice given   to you by your health care provider. Make sure you discuss any questions you have with your health care provider. °Document Released: 10/06/2009 Document Revised: 05/17/2016 Document Reviewed: 05/30/2015 °Elsevier Interactive Patient Education © 2018 [employer] ° °CCS ______[facility] °LAPAROSCOPIC SURGERY: POST OP INSTRUCTIONS °Always review your discharge instruction sheet given to you by the facility where your surgery was performed. °IF YOU HAVE DISABILITY OR FAMILY LEAVE FORMS, YOU MUST BRING THEM TO THE OFFICE FOR PROCESSING.   °DO NOT GIVE THEM TO YOUR DOCTOR. ° °1. A prescription for pain medication may be given to you upon  discharge.  Take your pain medication as prescribed, if needed.  If narcotic pain medicine is not needed, then you may take acetaminophen (Tylenol) or ibuprofen (Advil) as needed. °2. Take your usually prescribed medications unless otherwise directed. °3. If you need a refill on your pain medication, please contact your pharmacy.  They will contact our office to request authorization. Prescriptions will not be filled after 5pm or on week-ends. °4. You should follow a light diet the first few days after arrival home, such as soup and crackers, etc.  Be sure to include lots of fluids daily. °5. Most patients will experience some swelling and bruising in the area of the incisions.  Ice packs will help.  Swelling and bruising can take several days to resolve.  °6. It is common to experience some constipation if taking pain medication after surgery.  Increasing fluid intake and taking a stool softener (such as Colace) will usually help or prevent this problem from occurring.  A mild laxative (Milk of Magnesia or Miralax) should be taken according to package instructions if there are no bowel movements after 48 hours. °7. Unless discharge instructions indicate otherwise, you may remove your bandages 24-48 hours after surgery, and you may shower at that time.  You may have steri-strips (small skin tapes) in place directly over the incision.  These strips should be left on the skin for 7-10 days.  If your surgeon used skin glue on the incision, you may shower in 24 hours.  The glue will flake off over the next 2-3 weeks.  Any sutures or staples will be removed at the office during your follow-up visit. °8. ACTIVITIES:  You may resume regular (light) daily activities beginning the next day--such as daily self-care, walking, climbing stairs--gradually increasing activities as tolerated.  You may have sexual intercourse when it is comfortable.  Refrain from any heavy lifting or straining until approved by your doctor. °a. You  may drive when you are no longer taking prescription pain medication, you can comfortably wear a seatbelt, and you can safely maneuver your car and apply brakes. °b. RETURN TO WORK:  __________________________________________________________ °9. You should see your doctor in the office for a follow-up appointment approximately 2-3 weeks after your surgery.  Make sure that you call for this appointment within a day or two after you arrive home to insure a convenient appointment time. °10. OTHER INSTRUCTIONS: __________________________________________________________________________________________________________________________ __________________________________________________________________________________________________________________________ °WHEN TO CALL YOUR DOCTOR: °1. Fever over 101.0 °2. Inability to urinate °3. Continued bleeding from incision. °4. Increased pain, redness, or drainage from the incision. °5. Increasing abdominal pain ° °The clinic staff is available to answer your questions during regular business hours.  Please don’t hesitate to call and ask to speak to one of the nurses for clinical concerns.  If you have a medical emergency, go to the nearest emergency room or call 911.  A surgeon from   [facility] is always on call at the hospital. °[address], [address], [place], [place]  [address] ? [address] [address], [place], [place]   [address] °[phone] ? [phone] ? FAX [phone] °Web site: [email or web address] ° °

## 2017-07-22 NOTE — Progress Notes (Signed)
1 Day Post-Op    CC:  Abdominal pain  Subjective: Dawn Rice looks good this a.m. Has not been obnoxious a.m. no real complaints that Dawn Rice feels better.  Objective: Vital signs in last 24 hours: Temp:  [97.7 F (36.5 C)-100.1 F (37.8 C)] 98.1 F (36.7 C) (07/30 0558) Pulse Rate:  [89-126] 98 (07/30 0558) Resp:  [14-27] 19 (07/30 0558) BP: (99-131)/(59-97) 106/59 (07/30 0558) SpO2:  [93 %-99 %] 95 % (07/30 0558) Last BM Date: 07/20/17 100 by mouth recorded 2400 IV 600 urine Afebrile vital signs are stable Creatinine elevated at 1.5 WBC 15.4/ hemoglobin and hematocrit down to 9.3/30.3  Intake/Output from previous day: 07/29 0701 - 07/30 0700 In: 2438.8 [P.O.:100; I.V.:2088.8; IV Piggyback:200] Out: 650 [Urine:600; Blood:50] Intake/Output this shift: No intake/output data recorded.  General appearance: alert, cooperative and no distress Resp: clear to auscultation bilaterally GI: Soft, sore, port sites look fine.  Lab Results:   Recent Labs  07/21/17 0231 07/22/17 0518  WBC 7.7 15.4*  HGB 11.3* 9.3*  HCT 36.3 30.3*  PLT 376 281    BMET  Recent Labs  07/21/17 0231 07/22/17 0518  NA 140 142  K 4.0 3.9  CL 107 110  CO2 23 25  GLUCOSE 120* 119*  BUN 12 15  CREATININE 0.90 1.05*  CALCIUM 9.4 8.3*   PT/INR No results for input(s): LABPROT, INR in the last 72 hours.   Recent Labs Lab 07/21/17 0231  AST 28  ALT 16  ALKPHOS 99  BILITOT 0.3  PROT 6.8  ALBUMIN 3.7     Lipase     Component Value Date/Time   LIPASE 29 07/21/2017 0231     Medications: . acetaminophen  1,000 mg Oral Q6H  . allopurinol  100 mg Oral Daily  . amLODipine  5 mg Oral Daily  . atorvastatin  80 mg Oral q1800  . canagliflozin  100 mg Oral QAC breakfast  . carbamazepine  200 mg Oral Daily  . enoxaparin (LOVENOX) injection  40 mg Subcutaneous Q24H  . insulin aspart  0-15 Units Subcutaneous TID WC  . insulin aspart  4 Units Subcutaneous TID WC  . linagliptin  5 mg Oral q1800   . lisinopril  40 mg Oral Daily  . metFORMIN  1,000 mg Oral q1800  . metoprolol tartrate  25 mg Oral BID   . sodium chloride 75 mL/hr at 07/22/17 0656  . cefTRIAXone (ROCEPHIN)  IV     And  . metronidazole Stopped (07/22/17 0729)   Anti-infectives    Start     Dose/Rate Route Frequency Ordered Stop   07/22/17 0800  cefTRIAXone (ROCEPHIN) 2 g in dextrose 5 % 50 mL IVPB     2 g 100 mL/hr over 30 Minutes Intravenous Every 24 hours 07/21/17 1402     07/21/17 1415  metroNIDAZOLE (FLAGYL) IVPB 500 mg     500 mg 100 mL/hr over 60 Minutes Intravenous Every 8 hours 07/21/17 1402     07/21/17 1400  cefTRIAXone (ROCEPHIN) 2 g in dextrose 5 % 50 mL IVPB  Status:  Discontinued     2 g 100 mL/hr over 30 Minutes Intravenous Every 24 hours 07/21/17 1248 07/21/17 1402   07/21/17 1400  metroNIDAZOLE (FLAGYL) IVPB 500 mg  Status:  Discontinued     500 mg 100 mL/hr over 60 Minutes Intravenous Every 8 hours 07/21/17 1248 07/21/17 1402   07/21/17 0745  cefTRIAXone (ROCEPHIN) 2 g in dextrose 5 % 50 mL IVPB  2 g 100 mL/hr over 30 Minutes Intravenous  Once 07/21/17 0733 07/21/17 0822   07/21/17 0745  metroNIDAZOLE (FLAGYL) IVPB 500 mg     500 mg 100 mL/hr over 60 Minutes Intravenous  Once 07/21/17 [id number] 07/21/17 0856      Assessment/Plan Acute appendicitis with generalized peritonitis Status post laparoscopic appendectomy, 07/21/17, Dr. Manus RuddMatthew Rice Mild renal insufficiency Diabetes History of epilepsy Hypertension Hyperlipidemia History of gout Body mass index is 35 FEN:  IV fluids/car modified ID: Rocephin/Flagyl 07/21/17 - day 2 DVT: Lovenox  Plan:  Continue IV fluids, continue IV antibiotics. Mobilize. Recheck labs in a.m.  I have personally reviewed the patients medication history on the [place] controlled substance database.    LOS: 0 days    Dawn Rice 07/22/2017 [phone]

## 2017-07-23 LAB — GLUCOSE, CAPILLARY
GLUCOSE-CAPILLARY: 97 mg/dL (ref 65–99)
Glucose-Capillary: 89 mg/dL (ref 65–99)
Glucose-Capillary: 115 mg/dL — ABNORMAL HIGH (ref 65–99)
Glucose-Capillary: 110 mg/dL — ABNORMAL HIGH (ref 65–99)
Glucose-Capillary: 104 mg/dL — ABNORMAL HIGH (ref 65–99)

## 2017-07-23 LAB — BASIC METABOLIC PANEL WITH GFR
Anion gap: 9 (ref 5–15)
BUN: 9 mg/dL (ref 6–20)
CO2: 22 mmol/L (ref 22–32)
Calcium: 8.6 mg/dL — ABNORMAL LOW (ref 8.9–10.3)
Chloride: 108 mmol/L (ref 101–111)
Creatinine, Ser: 0.85 mg/dL (ref 0.44–1.00)
GFR calc Af Amer: >60 mL/min (ref >60)
GFR calc non Af Amer: >60 mL/min (ref >60)
Glucose, Bld: 87 mg/dL (ref 65–99)
Potassium: 4 mmol/L (ref 3.5–5.1)
Sodium: 139 mmol/L (ref 135–145)

## 2017-07-23 LAB — CBC
HEMATOCRIT: 31.5 % — AB (ref 36.0–46.0)
Hemoglobin: 9.5 g/dL — ABNORMAL LOW (ref 12.0–15.0)
MCH: 22.8 pg — ABNORMAL LOW (ref 26.0–34.0)
MCHC: 30.2 g/dL (ref 30.0–36.0)
MCV: 75.5 fL — ABNORMAL LOW (ref 78.0–100.0)
Platelets: 286 10*3/uL (ref 150–400)
RBC: 4.17 MIL/uL (ref 3.87–5.11)
RDW: 18.4 % — AB (ref 11.5–15.5)
WBC: 12.9 10*3/uL — AB (ref 4.0–10.5)

## 2017-07-23 MED ORDER — POLYETHYLENE GLYCOL 3350 17 G PO PACK
17.0000 g | PACK | Freq: Every day | ORAL | Status: DC
  Administered 2017-07-23 – 2017-07-24 (×2): 17 g via ORAL
  Filled 2017-07-23 (×2): qty 1

## 2017-07-23 MED ORDER — MORPHINE SULFATE (PF) 4 MG/ML IV SOLN: 2.0000 mg | INTRAVENOUS | Status: DC | PRN

## 2017-07-23 MED ORDER — DOCUSATE SODIUM 100 MG PO CAPS
100.0000 mg | ORAL_CAPSULE | Freq: Two times a day (BID) | ORAL | Status: DC
  Administered 2017-07-23 – 2017-07-24 (×2): 100 mg via ORAL
  Filled 2017-07-23 (×3): qty 1

## 2017-07-23 MED ORDER — OXYCODONE HCL 5 MG PO TABS: 5.0000 mg | ORAL_TABLET | ORAL | Status: DC | PRN

## 2017-07-23 NOTE — Progress Notes (Signed)
Patient ID: Dawn Rice, female   DOB: 02/19/1956, 61 y.o.   MRN: [id number]  [facility]  2 Days Post-Op  Subjective: CC- appendicitis Patient states that she is still a little nauseated, tolerating small amount of diet. Abdominal pain well controlled. Concerned that she has not had a BM yet, and she is passing little flatus. Ambulating ok to the bathroom.  Objective: Vital signs in last 24 hours: Temp:  [98 F (36.7 C)-98.7 F (37.1 C)] 98.1 F (36.7 C) (07/31 0524) Pulse Rate:  [93-104] 100 (07/31 0524) Resp:  [18] 18 (07/31 0524) BP: (124-149)/(88-93) 139/89 (07/31 0524) SpO2:  [96 %-99 %] 96 % (07/31 0524) Last BM Date: 07/20/17  Intake/Output from previous day: 07/30 0701 - 07/31 0700 In: 1526.7 [P.O.:480; I.V.:946.7; IV Piggyback:100] Out: 2000 [Urine:2000] Intake/Output this shift: Total I/O In: 240 [P.O.:240] Out: 800 [Urine:800]  PE: Gen:  Alert, NAD, pleasant HEENT: EOM's intact, pupils equal and round Card:  RRR, no M/G/R heard Pulm:  CTAB, no W/R/R, effort normal Abd: obese, soft, ND, appropriately tender, +BS, incisions C/D/I with steris in place Psych: A&Ox3  Skin: no rashes noted, warm and dry  Lab Results:   Recent Labs  07/22/17 0518 07/23/17 0443  WBC 15.4* 12.9*  HGB 9.3* 9.5*  HCT 30.3* 31.5*  PLT 281 286   BMET  Recent Labs  07/22/17 0518 07/23/17 0443  NA 142 139  K 3.9 4.0  CL 110 108  CO2 25 22  GLUCOSE 119* 87  BUN 15 9  CREATININE 1.05* 0.85  CALCIUM 8.3* 8.6*   PT/INR No results for input(s): LABPROT, INR in the last 72 hours. CMP     Component Value Date/Time   NA 139 07/23/2017 0443   K 4.0 07/23/2017 0443   CL 108 07/23/2017 0443   CO2 22 07/23/2017 0443   GLUCOSE 87 07/23/2017 0443   BUN 9 07/23/2017 0443   CREATININE 0.85 07/23/2017 0443   CALCIUM 8.6 (L) 07/23/2017 0443   PROT 6.8 07/21/2017 0231   ALBUMIN 3.7 07/21/2017 0231   AST 28 07/21/2017 0231   ALT 16 07/21/2017  0231   ALKPHOS 99 07/21/2017 0231   BILITOT 0.3 07/21/2017 0231   GFRNONAA >60 07/23/2017 0443   GFRAA >60 07/23/2017 0443   Lipase     Component Value Date/Time   LIPASE 29 07/21/2017 0231       Studies/Results: No results found.  Anti-infectives: Anti-infectives    Start     Dose/Rate Route Frequency Ordered Stop   07/22/17 0800  cefTRIAXone (ROCEPHIN) 2 g in dextrose 5 % 50 mL IVPB     2 g 100 mL/hr over 30 Minutes Intravenous Every 24 hours 07/21/17 1402     07/21/17 1415  metroNIDAZOLE (FLAGYL) IVPB 500 mg     500 mg 100 mL/hr over 60 Minutes Intravenous Every 8 hours 07/21/17 1402     07/21/17 1400  cefTRIAXone (ROCEPHIN) 2 g in dextrose 5 % 50 mL IVPB  Status:  Discontinued     2 g 100 mL/hr over 30 Minutes Intravenous Every 24 hours 07/21/17 1248 07/21/17 1402   07/21/17 1400  metroNIDAZOLE (FLAGYL) IVPB 500 mg  Status:  Discontinued     500 mg 100 mL/hr over 60 Minutes Intravenous Every 8 hours 07/21/17 1248 07/21/17 1402   07/21/17 0745  cefTRIAXone (ROCEPHIN) 2 g in dextrose 5 % 50 mL IVPB     2 g 100 mL/hr over 30 Minutes Intravenous  Once 07/21/17 0733 07/21/17 0822   07/21/17 0745  metroNIDAZOLE (FLAGYL) IVPB 500 mg     500 mg 100 mL/hr over 60 Minutes Intravenous  Once 07/21/17 0733 07/21/17 0856       Assessment/Plan Mild renal insufficiency - improved Diabetes - SSI History of epilepsy Hypertension Hyperlipidemia History of gout Body mass index is 35  Acute appendicitis with generalized peritonitis Status post laparoscopic appendectomy, 07/21/17, Dr. Manus RuddMatthew Tsuei - POD 2 - WBC trending down  FEN:  IV fluids/carb modified ID: Rocephin/Flagyl 07/21/17 - day 3 DVT: Lovenox  Plan:  Decrease IVF. Continue IV antibiotics. Add daily miralax and colace. Needs to mobilize.  Likely will be ready for discharge tomorrow.   LOS: 1 day    Franne FortsBROOKE A MEUTH , [facility] 07/23/2017, 9:11 AM Pager: [phone] Consults:  [phone] Mon-Fri 7:00 am-4:30 pm Sat-Sun 7:00 am-11:30 am

## 2017-07-24 LAB — GLUCOSE, CAPILLARY
GLUCOSE-CAPILLARY: 91 mg/dL (ref 65–99)
Glucose-Capillary: 79 mg/dL (ref 65–99)

## 2017-07-24 MED ORDER — TRAMADOL HCL 50 MG PO TABS: 50.0000 mg | ORAL_TABLET | Freq: Four times a day (QID) | ORAL | 0 refills | Status: DC | PRN

## 2017-07-24 MED ORDER — METRONIDAZOLE 500 MG PO TABS
500.0000 mg | ORAL_TABLET | Freq: Three times a day (TID) | ORAL | Status: DC
  Administered 2017-07-24 (×2): 500 mg via ORAL
  Filled 2017-07-24 (×2): qty 1

## 2017-07-24 MED ORDER — CIPROFLOXACIN HCL 500 MG PO TABS: 500.0000 mg | ORAL_TABLET | Freq: Two times a day (BID) | ORAL | 0 refills | Status: DC

## 2017-07-24 MED ORDER — CIPROFLOXACIN HCL 500 MG PO TABS
500.0000 mg | ORAL_TABLET | Freq: Two times a day (BID) | ORAL | Status: DC
  Administered 2017-07-24: 500 mg via ORAL
  Filled 2017-07-24: qty 1

## 2017-07-24 MED ORDER — ALUM & MAG HYDROXIDE-SIMETH 200-200-20 MG/5ML PO SUSP: 15.0000 mL | Freq: Four times a day (QID) | ORAL | Status: DC | PRN

## 2017-07-24 MED ORDER — METRONIDAZOLE 500 MG PO TABS: 500.0000 mg | ORAL_TABLET | Freq: Three times a day (TID) | ORAL | 0 refills | Status: DC

## 2017-07-24 NOTE — Discharge Summary (Signed)
[facility] Discharge Summary   Patient ID: Dawn Rice MRN: [id number] DOB/AGE: 59/06/1956 59 y.o.  Admit date: 07/21/2017 Discharge date: 07/24/2017  Admitting Diagnosis: Acute appendicitis  Discharge Diagnosis Patient Active Problem List   Diagnosis Date Noted  . Acute appendicitis 07/21/2017  . Essential hypertension, benign   . Chest pain 01/19/2017  . Hypertension 01/19/2017  . Hyperlipidemia 01/19/2017  . Diabetes (HCC) 01/19/2017  . Seizures (HCC) 01/19/2017  . Gout     Consultants None  Imaging: CT abdomen pelvis w contrast 07/21/17: There is acute inflammation surrounding the appendix.  No abscess.  Procedures Dr. Derrell Lollingamirez (07/21/17) - Laparoscopic Appendectomy  Hospital Course:  Dawn Rice is a 61yo female [facility] with acute onset lower abdominal pain.  Workup showed acute appendicitis.  Patient was admitted and underwent procedure listed above.  Tolerated procedure well and was transferred to the floor.  She did require IVF for 2 days due to acute kidney injury found on admission. Diet was advanced as tolerated.  On POD3 the patient was voiding well, tolerating diet, ambulating well, pain well controlled, vital signs stable, incisions c/d/i and felt stable for discharge home.  Patient will follow up in our office in 2 weeks and knows to call with questions or concerns.    I have personally reviewed the patients medication history on the [place] controlled substance database.    Physical Exam: Gen:  Alert, NAD, pleasant HEENT: EOM's intact, pupils equal and round Card:  RRR, no M/G/R heard Pulm:  CTAB, no W/R/R, effort normal Abd: obese, soft, ND, appropriately tender, +BS, incisions C/D/I with steris in place Psych: A&Ox3  Skin: no rashes noted, warm and dry   Allergies as of 07/24/2017      Reactions   Penicillins Itching   Has patient had a PCN reaction causing immediate rash, facial/tongue/throat swelling, SOB or  lightheadedness with hypotension: No Has patient had a PCN reaction causing severe rash involving mucus membranes or skin necrosis: No Has patient had a PCN reaction that required hospitalization No Has patient had a PCN reaction occurring within the last 10 years: No If all of the above answers are "NO", then may proceed with Cephalosporin use.      Medication List    TAKE these medications   allopurinol 100 MG tablet Commonly known as:  ZYLOPRIM Take 100 mg by mouth daily.   amLODipine 2.5 MG tablet Commonly known as:  NORVASC Take 2 tablets (5 mg total) by mouth daily.   atorvastatin 80 MG tablet Commonly known as:  LIPITOR Take 80 mg by mouth daily.   carbamazepine 200 MG 12 hr capsule Commonly known as:  CARBATROL Take 200 mg by mouth daily.   ciprofloxacin 500 MG tablet Commonly known as:  CIPRO Take 1 tablet (500 mg total) by mouth 2 (two) times daily.   FARXIGA 5 MG Tabs tablet Generic drug:  dapagliflozin propanediol Take 5 mg by mouth daily.   JANUMET XR [phone] MG Tb24 Generic drug:  SitaGLIPtin-MetFORMIN HCl Take 1 tablet by mouth daily with supper.   lisinopril 40 MG tablet Commonly known as:  PRINIVIL,ZESTRIL Take 40 mg by mouth daily.   metoprolol tartrate 25 MG tablet Commonly known as:  LOPRESSOR Take 25 mg by mouth 2 (two) times daily.   metroNIDAZOLE 500 MG tablet Commonly known as:  FLAGYL Take 1 tablet (500 mg total) by mouth every 8 (eight) hours.   traMADol 50 MG tablet Commonly known as:  ULTRAM Take 1 tablet (50 mg total) by mouth every 6 (six) hours as needed (mild pain).        Follow-up Information    [facility] up on 08/07/2017.   Specialty:  General Surgery Why:  Your appointment is at 10:00AM.  Be at the office 30 minutes early for check in.  Bring photo ID and insurance information.   Contact information: [address] [place] [address] [phone]           Signed: Franne FortsBROOKE A  MEUTH, [facility] 07/24/2017, 7:54 AM Pager: [phone] Consults: [phone] Mon-Fri 7:00 am-4:30 pm Sat-Sun 7:00 am-11:30 am

## 2017-07-24 NOTE — Progress Notes (Signed)
Pt medicated for c/o a headache with prn tramadol. Pt reported relief when rechecked

## 2017-07-24 NOTE — Progress Notes (Signed)
Pt discharged home in stable condition after going over discharge instructions with no concerns expressed. AVS and discharge scripts given pre discharge

## 2017-09-02 ENCOUNTER — Encounter (HOSPITAL_COMMUNITY): Payer: Self-pay | Admitting: Emergency Medicine

## 2017-09-02 ENCOUNTER — Emergency Department (HOSPITAL_COMMUNITY)
Admission: EM | Admit: 2017-09-02 | Discharge: 2017-09-02 | Disposition: A | Discharge status: Home / Self Care | Payer: Medicare Other | Attending: Physician Assistant | Admitting: Physician Assistant

## 2017-09-02 DIAGNOSIS — I10 Essential (primary) hypertension: Secondary | ICD-10-CM | POA: Diagnosis present

## 2017-09-02 DIAGNOSIS — E119 Type 2 diabetes mellitus without complications: Secondary | ICD-10-CM | POA: Diagnosis not present

## 2017-09-02 DIAGNOSIS — Z79899 Other long term (current) drug therapy: Secondary | ICD-10-CM | POA: Insufficient documentation

## 2017-09-02 DIAGNOSIS — Z7984 Long term (current) use of oral hypoglycemic drugs: Secondary | ICD-10-CM | POA: Diagnosis not present

## 2017-09-02 DIAGNOSIS — Z87891 Personal history of nicotine dependence: Secondary | ICD-10-CM | POA: Insufficient documentation

## 2017-09-02 LAB — CBC
HCT: 35.6 % — ABNORMAL LOW (ref 36.0–46.0)
HEMOGLOBIN: 11.1 g/dL — AB (ref 12.0–15.0)
MCH: 23.4 pg — AB (ref 26.0–34.0)
MCHC: 31.2 g/dL (ref 30.0–36.0)
MCV: 74.9 fL — ABNORMAL LOW (ref 78.0–100.0)
Platelets: 343 10*3/uL (ref 150–400)
RBC: 4.75 MIL/uL (ref 3.87–5.11)
RDW: 18.1 % — ABNORMAL HIGH (ref 11.5–15.5)
WBC: 6.1 10*3/uL (ref 4.0–10.5)

## 2017-09-02 LAB — BASIC METABOLIC PANEL
ANION GAP: 8 (ref 5–15)
BUN: 12 mg/dL (ref 6–20)
CHLORIDE: 107 mmol/L (ref 101–111)
CO2: 26 mmol/L (ref 22–32)
Calcium: 9.4 mg/dL (ref 8.9–10.3)
Creatinine, Ser: 0.7 mg/dL (ref 0.44–1.00)
GFR calc Af Amer: >60 mL/min (ref >60)
GFR calc non Af Amer: >60 mL/min (ref >60)
Glucose, Bld: 122 mg/dL — ABNORMAL HIGH (ref 65–99)
POTASSIUM: 4.4 mmol/L (ref 3.5–5.1)
SODIUM: 141 mmol/L (ref 135–145)

## 2017-09-02 NOTE — Discharge Instructions (Signed)
Please read instructions below. Continue taking your blood pressure medication as prescribed. Schedule an appointment with your primary care provider to follow-up on your elevated blood pressure. Return to the ER for severe headache, vision changes, chest pain, or new or concerning symptoms.

## 2017-09-02 NOTE — ED Provider Notes (Signed)
[facility]-EMERGENCY DEPT Provider Note   CSN: [id number] Arrival date & time: 09/02/17  0609     History   Chief Complaint Chief Complaint  Patient presents with  . Hypertension    HPI Dawn Rice is a 61 y.o. female w PMHx HTN, HLD, T2Dm, seizures, gout, presenting to the ED for high blood pressure occurred this morning. Patient states yesterday evening she began having a mild headache and decided to check her blood pressure. She states around 3 AM this morning her blood pressure was taken at a pharmacy which was 200/100. She states she caught her PCP this morning who recommended she was a year is a were unable to get her in for an appointment. She states she took her blood pressure medicine around oh 3:30 AM, including metoprolol, lisinopril, Norvasc. In triage patient's blood pressure noted to be 202/104 initially. Patient denying vision changes, chest pain, shortness of breath, abdominal pain, or any other complaints today. She states her headache has resolved on my evaluation, with blood pressure 157/92 without intervention.  The history is provided by the patient.    Past Medical History:  Diagnosis Date  . Epilepsy (HCC)   . Gout   . History of blood transfusion    "when I got my hysterectomy" (07/22/2017)  . Hyperlipidemia   . Hypertension   . Seizures (HCC)    "from epilepsy; the kind where I space out; last one was ~ 1 yr ago" (07/22/2017)  . Type II diabetes mellitus [facility])     Patient Active Problem List   Diagnosis Date Noted  . Acute appendicitis 07/21/2017  . Essential hypertension, benign   . Chest pain 01/19/2017  . Hypertension 01/19/2017  . Hyperlipidemia 01/19/2017  . Diabetes (HCC) 01/19/2017  . Seizures (HCC) 01/19/2017  . Gout     Past Surgical History:  Procedure Laterality Date  . ABDOMINAL HYSTERECTOMY    . APPENDECTOMY    . CATARACT EXTRACTION W/ INTRAOCULAR LENS  IMPLANT, BILATERAL Bilateral 2016-2017   right-left  . CHOLECYSTECTOMY   03/2009   lap/notes 04/25/2011  . LAPAROSCOPIC APPENDECTOMY N/A 07/21/2017   Procedure: APPENDECTOMY LAPAROSCOPIC;  Surgeon: Manus Rudd, MD;  Location: [facility] OR;  Service: General;  Laterality: N/A;  . TUBAL LIGATION      OB History    No data available       Home Medications    Prior to Admission medications   Medication Sig Start Date End Date Taking? Authorizing Provider  allopurinol (ZYLOPRIM) 100 MG tablet Take 100 mg by mouth daily.    [provider]  amLODipine (NORVASC) 2.5 MG tablet Take 2 tablets (5 mg total) by mouth daily. 01/20/17   Dhungel, Theda Belfast, MD  atorvastatin (LIPITOR) 80 MG tablet Take 80 mg by mouth daily. 06/26/15   [provider]  carbamazepine (CARBATROL) 200 MG 12 hr capsule Take 200 mg by mouth daily. 08/19/15   [provider]  ciprofloxacin (CIPRO) 500 MG tablet Take 1 tablet (500 mg total) by mouth 2 (two) times daily. 07/24/17   Meuth, Brooke A, PA-C  dapagliflozin propanediol (FARXIGA) 5 MG TABS tablet Take 5 mg by mouth daily.    [provider]  lisinopril (PRINIVIL,ZESTRIL) 40 MG tablet Take 40 mg by mouth daily. 07/05/15   [provider]  metoprolol tartrate (LOPRESSOR) 25 MG tablet Take 25 mg by mouth 2 (two) times daily.    [provider]  metroNIDAZOLE (FLAGYL) 500 MG tablet Take 1 tablet (500 mg total) by  mouth every 8 (eight) hours. 07/24/17   Meuth, Brooke A, PA-C  SitaGLIPtin-MetFORMIN HCl (JANUMET XR) [id number] MG TB24 Take 1 tablet by mouth daily with supper.    [provider]  traMADol (ULTRAM) 50 MG tablet Take 1 tablet (50 mg total) by mouth every 6 (six) hours as needed (mild pain). 07/24/17   Meuth, Lina SarBrooke A, PA-C    Family History Family History  Problem Relation Age of Onset  . Diabetes Mother   . Hypertension Mother   . Heart failure Mother   . Diabetes Father   . Coronary artery disease Father     Social History Social History  Substance Use Topics  . Smoking status:  Former Smoker    Packs/day: 0.50    Years: 20.00    Types: Cigarettes    Quit date: 2009  . Smokeless tobacco: Never Used  . Alcohol use Yes     Comment: 07/22/2017 "glass of wine maybe once/month"     Allergies   Penicillins   Review of Systems Review of Systems  Constitutional: Negative for chills, diaphoresis and fever.  HENT: Negative.   Eyes: Negative for photophobia and visual disturbance.  Respiratory: Negative for shortness of breath.   Cardiovascular: Negative for chest pain and leg swelling.  Gastrointestinal: Negative for abdominal pain, nausea and vomiting.  Genitourinary: Negative.   Musculoskeletal: Negative for back pain.  Neurological: Positive for headaches. Negative for syncope and numbness.  All other systems reviewed and are negative.    Physical Exam Updated Vital Signs BP (!) 144/95   Pulse (!) 56   Temp 97.9 F (36.6 C) (Oral)   Resp 15   SpO2 100%   Physical Exam  Constitutional: She is oriented to person, place, and time. She appears well-developed and well-nourished. No distress.  Well-appearing.  HENT:  Head: Normocephalic and atraumatic.  Mouth/Throat: Oropharynx is clear and moist.  Eyes: Pupils are equal, round, and reactive to light. Conjunctivae and EOM are normal.  Neck: Normal range of motion. Neck supple. No tracheal deviation present.  Cardiovascular: Regular rhythm, normal heart sounds and intact distal pulses.  Exam reveals no gallop and no friction rub.   No murmur heard. Slightly bradycardic  Pulmonary/Chest: Effort normal and breath sounds normal. No stridor. No respiratory distress. She has no wheezes. She has no rales. She exhibits no tenderness.  Abdominal: Soft. Bowel sounds are normal. She exhibits no distension and no mass. There is no tenderness. There is no rebound and no guarding.  Musculoskeletal: Normal range of motion.  Lymphadenopathy:    She has no cervical adenopathy.  Neurological: She is alert and oriented  to person, place, and time. No cranial nerve deficit.  Skin: Skin is warm.  Psychiatric: She has a normal mood and affect. Her behavior is normal.  Nursing note and vitals reviewed.    ED Treatments / Results  Labs (all labs ordered are listed, but only abnormal results are displayed) Labs Reviewed  CBC - Abnormal; Notable for the following:       Result Value   Hemoglobin 11.1 (*)    HCT 35.6 (*)    MCV 74.9 (*)    MCH 23.4 (*)    RDW 18.1 (*)    All other components within normal limits  BASIC METABOLIC PANEL - Abnormal; Notable for the following:    Glucose, Bld 122 (*)    All other components within normal limits    EKG  EKG Interpretation None  Radiology No results found.  Procedures Procedures (including critical care time)  Medications Ordered in ED Medications - No data to display   Initial Impression / Assessment and Plan / ED Course  I have reviewed the triage vital signs and the nursing notes.  Pertinent labs & imaging results that were available during my care of the patient were reviewed by me and considered in my medical decision making (see chart for details).     Patient noted to be hypertensive in the emergency department on initial BP, however with significant improvement without intervention prior to discharge. Pt w headache initially, however headache resolved with lowered BP. No signs of hypertensive urgency.  Labs without acute change from baseline. Discussed with patient the need for close follow-up and management by their primary care physician.   Patient discussed with and seen by Dr. Corlis Leak.  Discussed results, findings, treatment and follow up. Patient advised of return precautions. Patient verbalized understanding and agreed with plan.   Final Clinical Impressions(s) / ED Diagnoses   Final diagnoses:  Hypertension, unspecified type    New Prescriptions Discharge Medication List as of 09/02/2017  9:51 AM       Russo,  [place] N, PA-C 09/02/17 1148    Mackuen, Cindee Salt, MD 09/05/17 1006

## 2017-09-02 NOTE — ED Triage Notes (Signed)
Pt reports elevated BP since yesterday, notes 200/115. States hx of same, reports compliance with HTN meds. Denies dizziness, vision changes, but reports a headache.

## 2017-12-13 ENCOUNTER — Other Ambulatory Visit: Payer: Self-pay | Admitting: Internal Medicine

## 2017-12-13 ENCOUNTER — Ambulatory Visit
Admission: RE | Admit: 2017-12-13 | Discharge: 2017-12-13 | Disposition: A | Discharge status: Home / Self Care | Payer: Medicare Other | Source: Ambulatory Visit | Attending: Internal Medicine | Admitting: Internal Medicine

## 2017-12-13 DIAGNOSIS — R1030 Lower abdominal pain, unspecified: Secondary | ICD-10-CM

## 2017-12-20 ENCOUNTER — Encounter (HOSPITAL_COMMUNITY): Payer: Self-pay | Admitting: *Deleted

## 2017-12-20 ENCOUNTER — Emergency Department (HOSPITAL_COMMUNITY)
Admission: EM | Admit: 2017-12-20 | Discharge: 2017-12-20 | Discharge status: Against Medical Advice | Payer: Medicare Other | Attending: Emergency Medicine | Admitting: Emergency Medicine

## 2017-12-20 DIAGNOSIS — Z5321 Procedure and treatment not carried out due to patient leaving prior to being seen by health care provider: Secondary | ICD-10-CM | POA: Insufficient documentation

## 2017-12-20 DIAGNOSIS — R109 Unspecified abdominal pain: Secondary | ICD-10-CM | POA: Insufficient documentation

## 2017-12-20 NOTE — ED Notes (Signed)
Called patient to go to room x 3 and no answer.

## 2017-12-20 NOTE — ED Triage Notes (Signed)
Pt complains of worsening abdominal pain, n/v x 1 week. Pt states she has had some increased urinary frequency.

## 2017-12-20 NOTE — ED Notes (Signed)
Pt called from the lobby with no response 

## 2017-12-25 ENCOUNTER — Ambulatory Visit (INDEPENDENT_AMBULATORY_CARE_PROVIDER_SITE_OTHER): Payer: Medicare Other | Admitting: Orthopaedic Surgery

## 2018-02-18 ENCOUNTER — Encounter (HOSPITAL_COMMUNITY): Payer: Self-pay | Admitting: Emergency Medicine

## 2018-02-18 ENCOUNTER — Other Ambulatory Visit: Payer: Self-pay

## 2018-02-18 ENCOUNTER — Emergency Department (HOSPITAL_COMMUNITY)
Admission: EM | Admit: 2018-02-18 | Discharge: 2018-02-18 | Disposition: A | Discharge status: Home / Self Care | Payer: No Typology Code available for payment source | Attending: Emergency Medicine | Admitting: Emergency Medicine

## 2018-02-18 DIAGNOSIS — M549 Dorsalgia, unspecified: Secondary | ICD-10-CM | POA: Diagnosis not present

## 2018-02-18 DIAGNOSIS — Y939 Activity, unspecified: Secondary | ICD-10-CM | POA: Diagnosis not present

## 2018-02-18 DIAGNOSIS — Z79899 Other long term (current) drug therapy: Secondary | ICD-10-CM | POA: Insufficient documentation

## 2018-02-18 DIAGNOSIS — M545 Low back pain: Secondary | ICD-10-CM | POA: Diagnosis not present

## 2018-02-18 DIAGNOSIS — Y92410 Unspecified street and highway as the place of occurrence of the external cause: Secondary | ICD-10-CM | POA: Insufficient documentation

## 2018-02-18 DIAGNOSIS — T148XXA Other injury of unspecified body region, initial encounter: Secondary | ICD-10-CM | POA: Diagnosis present

## 2018-02-18 DIAGNOSIS — I10 Essential (primary) hypertension: Secondary | ICD-10-CM | POA: Diagnosis not present

## 2018-02-18 DIAGNOSIS — Z87891 Personal history of nicotine dependence: Secondary | ICD-10-CM | POA: Insufficient documentation

## 2018-02-18 DIAGNOSIS — Y998 Other external cause status: Secondary | ICD-10-CM | POA: Insufficient documentation

## 2018-02-18 DIAGNOSIS — E119 Type 2 diabetes mellitus without complications: Secondary | ICD-10-CM | POA: Insufficient documentation

## 2018-02-18 DIAGNOSIS — Z7984 Long term (current) use of oral hypoglycemic drugs: Secondary | ICD-10-CM | POA: Insufficient documentation

## 2018-02-18 MED ORDER — CYCLOBENZAPRINE HCL 10 MG PO TABS: 10.0000 mg | ORAL_TABLET | Freq: Two times a day (BID) | ORAL | 0 refills | Status: DC | PRN
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
## 2018-02-18 NOTE — ED Notes (Signed)
See provider assessment 

## 2018-03-04 ENCOUNTER — Ambulatory Visit: Payer: No Typology Code available for payment source | Attending: Internal Medicine | Admitting: Physical Therapy

## 2018-03-04 ENCOUNTER — Encounter: Payer: Self-pay | Admitting: Physical Therapy

## 2018-03-04 DIAGNOSIS — M546 Pain in thoracic spine: Secondary | ICD-10-CM | POA: Diagnosis present

## 2018-03-04 DIAGNOSIS — R293 Abnormal posture: Secondary | ICD-10-CM | POA: Diagnosis present
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
## 2018-03-04 NOTE — Patient Instructions (Signed)
   Use the tennis ball on the wall to help work out any tight muscles.   Rotation, Standing    Stand, arms extended forward. Twist to one side looking that direction as far as possible. Hold _15-20__ seconds. Repeat to other side. Repeat _2-3__ times per session. Do _1-2__ sessions per day.   Mid-Back Rotation Stretch    Sit in a chair and put hand out on table or office chair in front of you. Reach to each side as far as possible, keeping chest low to floor. Hold __15-20__ seconds. Repeat __2-3__ times per set. Do __1__ sets per session. Do __1-2__ sessions per day.

## 2018-03-17 ENCOUNTER — Ambulatory Visit: Payer: No Typology Code available for payment source | Admitting: Physical Therapy

## 2018-03-17 ENCOUNTER — Encounter: Payer: Self-pay | Admitting: Physical Therapy

## 2018-03-17 DIAGNOSIS — M546 Pain in thoracic spine: Secondary | ICD-10-CM | POA: Diagnosis not present

## 2018-03-17 DIAGNOSIS — R293 Abnormal posture: Secondary | ICD-10-CM
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
## 2018-03-17 NOTE — Patient Instructions (Addendum)
Over Head Pull: Narrow Grip       On back, knees bent, feet flat, band across thighs, elbows straight but relaxed. Pull hands apart (start). Keeping elbows straight, bring arms up and over head, hands toward floor. Keep pull steady on band. Hold momentarily. Return slowly, keeping pull steady, back to start. Repeat _10__ times. Band color __R____   Side Pull: Double Arm   On back, knees bent, feet flat. Arms perpendicular to body, shoulder level, elbows straight but relaxed. Pull arms out to sides, elbows straight. Resistance band comes across collarbones, hands toward floor. Hold momentarily. Slowly return to starting position. Repeat _10__ times. Band color __R___   Sash   On back, knees bent, feet flat, left hand on left hip, right hand above left. Pull right arm DIAGONALLY (hip to shoulder) across chest. Bring right arm along head toward floor. Hold momentarily. Slowly return to starting position.R Repeat _10__ times. Do with left arm. Band color ____R__   Shoulder Rotation: Double Arm   On back, knees bent, feet flat, elbows tucked at sides, bent 90, hands palms up. Pull hands apart and down toward floor, keeping elbows near sides. Hold momentarily. Slowly return to starting position. Repeat _10__ times. Band color __R____   

## 2018-03-19 ENCOUNTER — Encounter: Payer: Self-pay | Admitting: Physical Therapy

## 2018-03-19 ENCOUNTER — Ambulatory Visit: Payer: No Typology Code available for payment source | Admitting: Physical Therapy

## 2018-03-19 DIAGNOSIS — R293 Abnormal posture: Secondary | ICD-10-CM

## 2018-03-19 DIAGNOSIS — M546 Pain in thoracic spine: Secondary | ICD-10-CM
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
## 2018-03-24 ENCOUNTER — Encounter: Payer: Self-pay | Admitting: Physical Therapy

## 2018-03-24 ENCOUNTER — Ambulatory Visit: Payer: Medicare Other | Attending: Internal Medicine | Admitting: Physical Therapy

## 2018-03-24 DIAGNOSIS — M546 Pain in thoracic spine: Secondary | ICD-10-CM | POA: Diagnosis present

## 2018-03-24 DIAGNOSIS — R293 Abnormal posture: Secondary | ICD-10-CM | POA: Insufficient documentation
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
## 2018-03-26 ENCOUNTER — Ambulatory Visit: Payer: Medicare Other | Admitting: Physical Therapy

## 2018-03-26 ENCOUNTER — Encounter: Payer: Self-pay | Admitting: Physical Therapy

## 2018-03-26 DIAGNOSIS — M546 Pain in thoracic spine: Secondary | ICD-10-CM

## 2018-03-26 DIAGNOSIS — R293 Abnormal posture: Secondary | ICD-10-CM

## 2018-03-26 NOTE — Patient Instructions (Signed)
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
## 2018-03-31 ENCOUNTER — Ambulatory Visit: Payer: Medicare Other | Admitting: Physical Therapy

## 2018-03-31 DIAGNOSIS — R293 Abnormal posture: Secondary | ICD-10-CM

## 2018-03-31 DIAGNOSIS — M546 Pain in thoracic spine: Secondary | ICD-10-CM
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
## 2018-03-31 NOTE — Patient Instructions (Signed)
Bird Dog Pose - Intermediate    Keep pelvis stable. From table pose, step one leg back to plank pose. Reach opposite arm forward, back foot off floor.  Hold for __2 , work up to 10-15 sec __ breaths. Return arm and leg at same rate. Repeat __5-10__ times, alternating sides.  Copyright  VHI. All rights reserved.

## 2018-04-02 ENCOUNTER — Ambulatory Visit: Payer: Medicare Other | Admitting: Physical Therapy

## 2018-04-02 ENCOUNTER — Encounter: Payer: Self-pay | Admitting: Physical Therapy

## 2018-04-02 DIAGNOSIS — M546 Pain in thoracic spine: Secondary | ICD-10-CM | POA: Diagnosis not present

## 2018-04-02 DIAGNOSIS — R293 Abnormal posture: Secondary | ICD-10-CM
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
## 2018-04-07 ENCOUNTER — Encounter: Payer: Medicare Other | Admitting: Physical Therapy

## 2018-04-09 ENCOUNTER — Encounter: Payer: Medicare Other | Admitting: Physical Therapy

## 2018-09-30 IMAGING — CT CT ABD-PELV W/ CM
2 of 5 series · 17 of 46 positions shown, 19 images · IV contrast (APPLIED)
Comparison: None.

CLINICAL DATA: Lower abdominal pain, onset at [DATE].

EXAM:
CT ABDOMEN AND PELVIS WITH CONTRAST
TECHNIQUE: Multidetector CT imaging of the abdomen and pelvis was performed
using the standard protocol following bolus administration of
intravenous contrast.
CONTRAST:  100mL APX7KK-TRR IOPAMIDOL (APX7KK-TRR) INJECTION 61%

[Series 3: abdomen 5.0 · axial · 0.85mm/px · z∈[+891,+1276]mm · 14 of 89 slices shown, 16 images]
[im 6/89  soft-tissue]
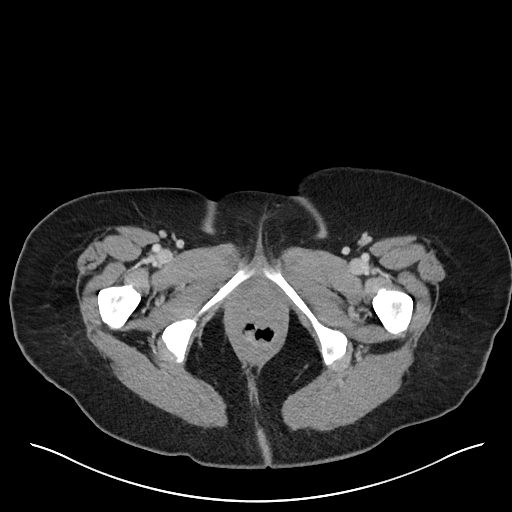
[im 6/89  bone]
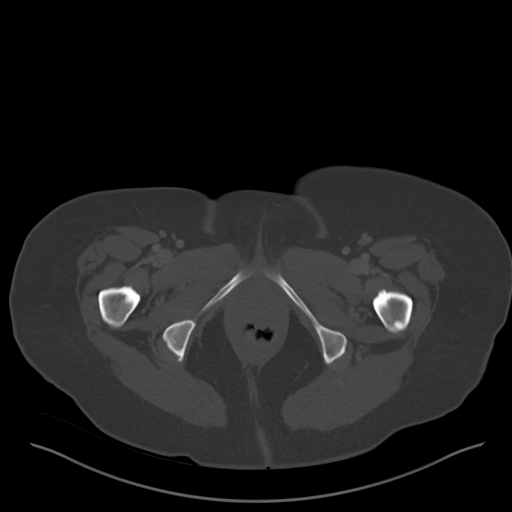
[im 11/89  soft-tissue]
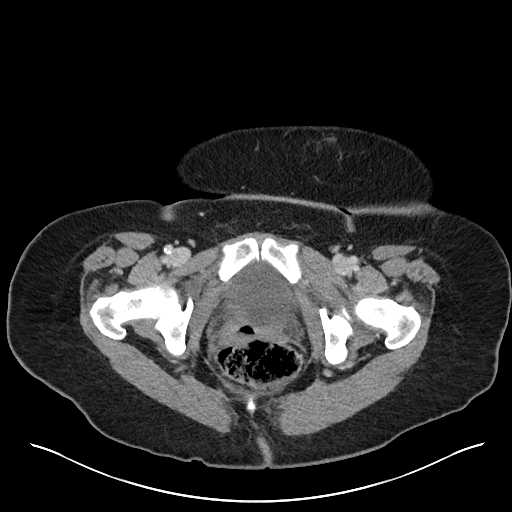
[im 16/89  soft-tissue]
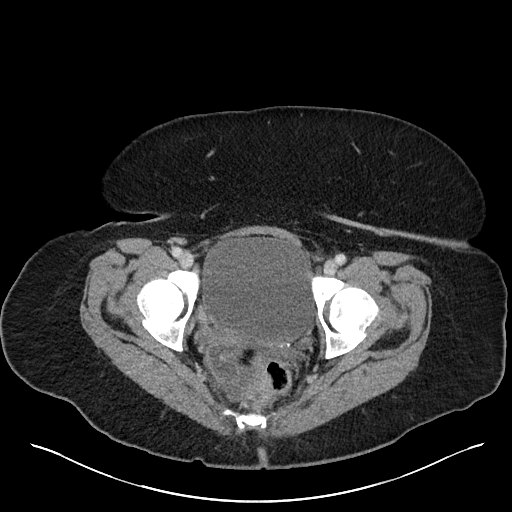
[im 26/89  soft-tissue]
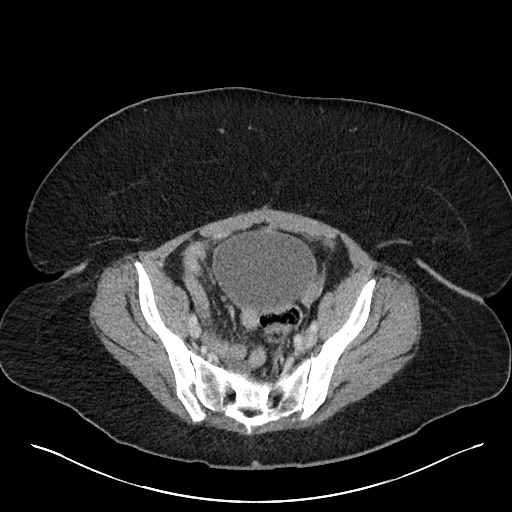
[im 32/89  soft-tissue]
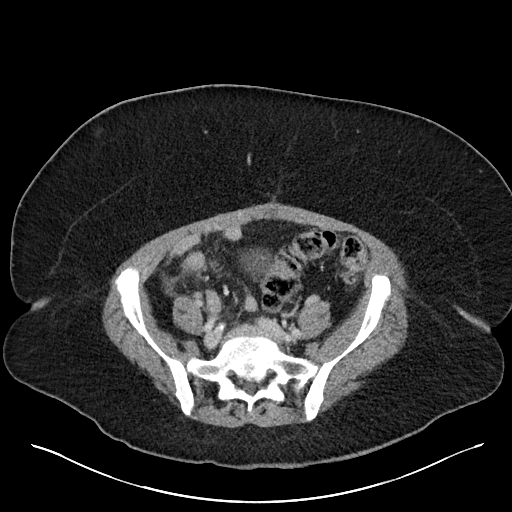
[im 37/89  soft-tissue]
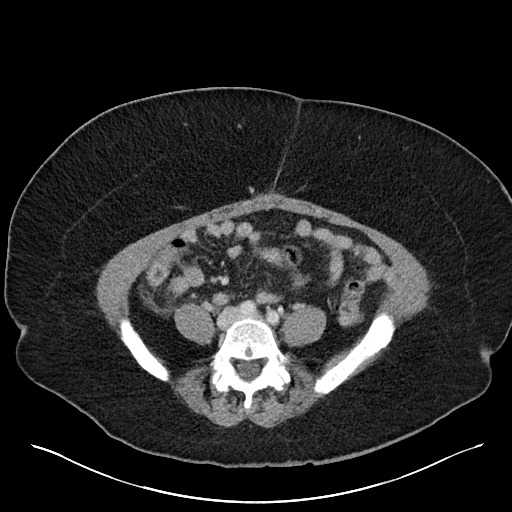
[im 42/89  soft-tissue]
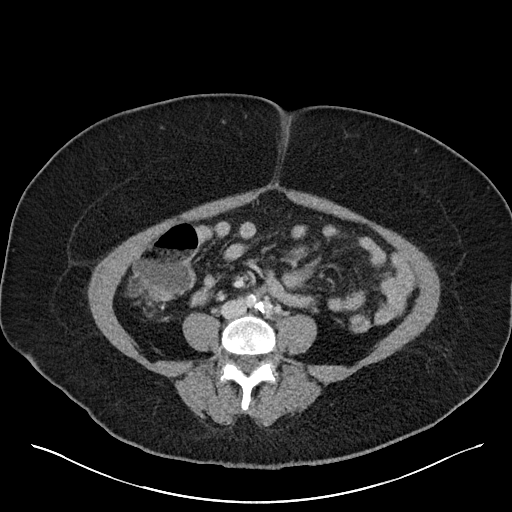
[im 47/89  soft-tissue]
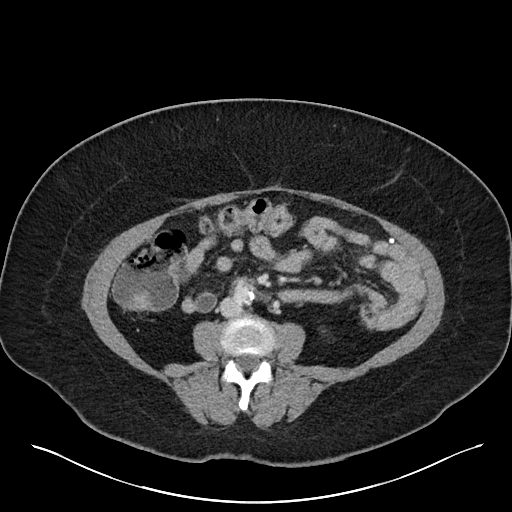
[im 52/89  soft-tissue]
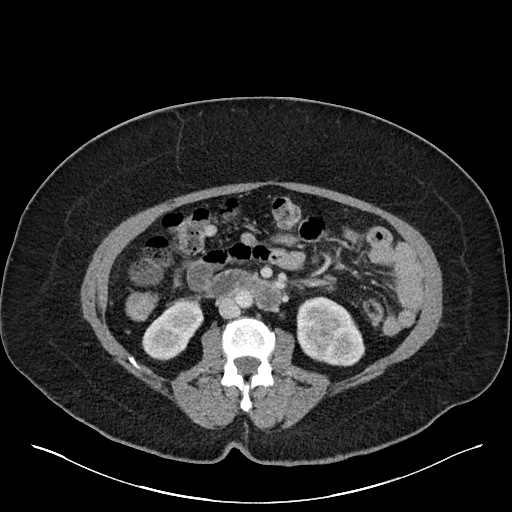
[im 52/89  bone]
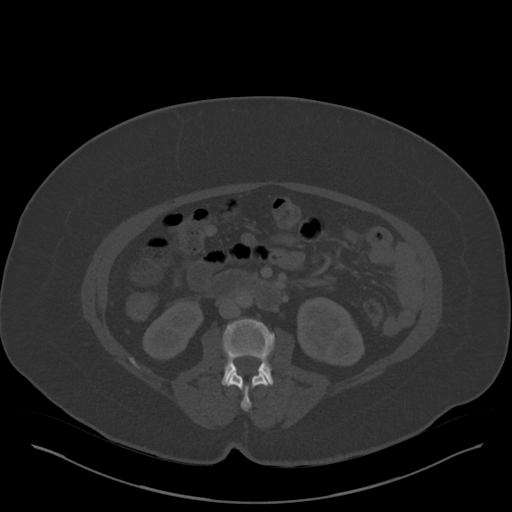
[im 57/89  soft-tissue]
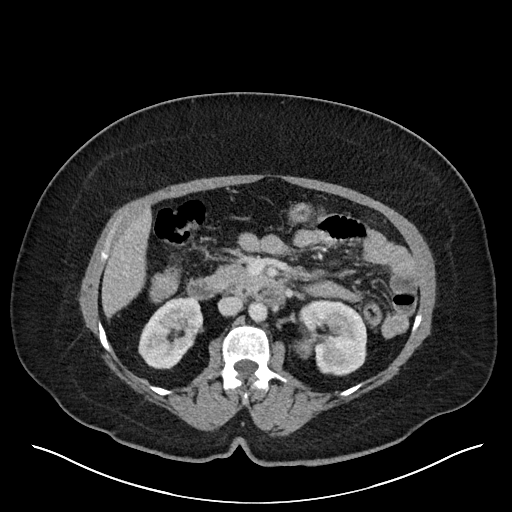
[im 68/89  soft-tissue]
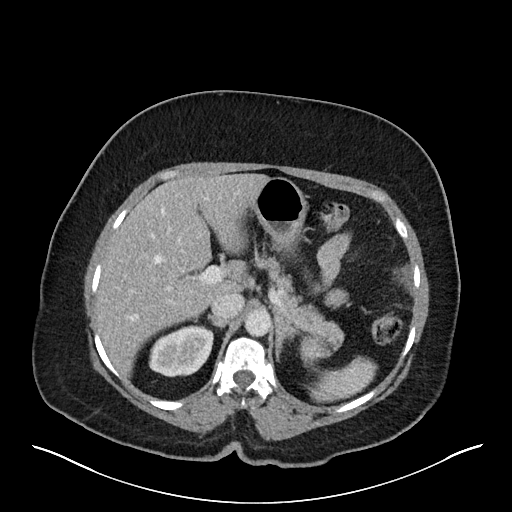
[im 73/89  soft-tissue]
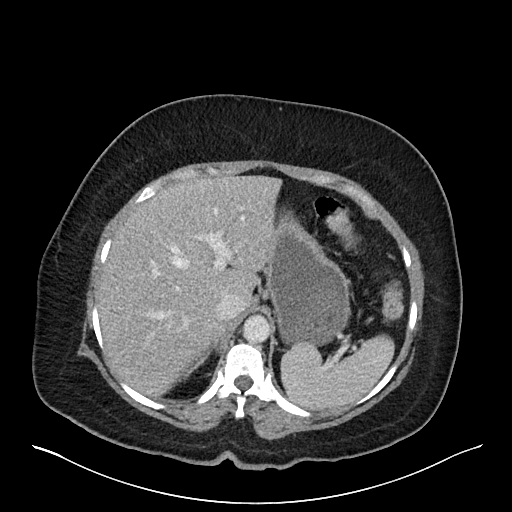
[im 78/89  soft-tissue]
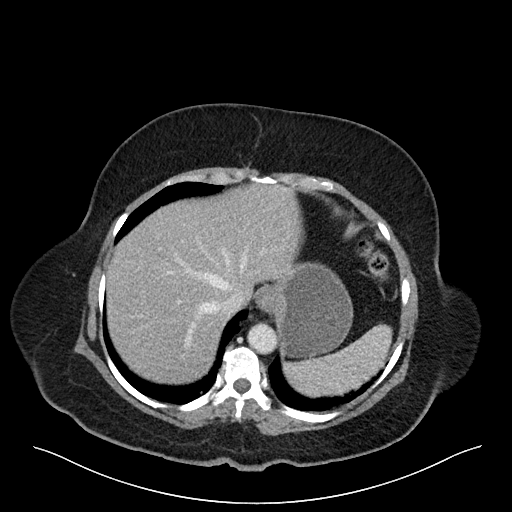
[im 83/89  soft-tissue]
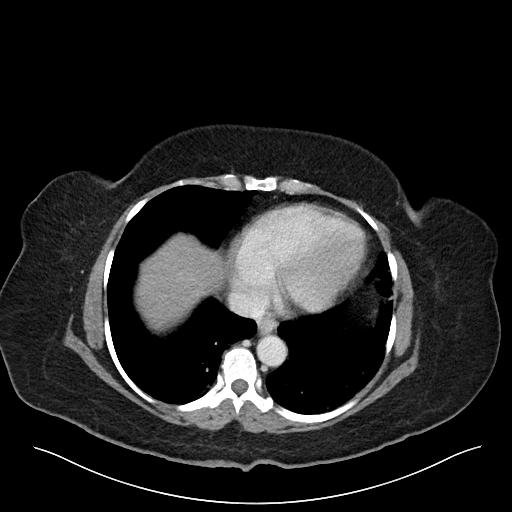

[Series 6: abdomen 3.0 mpr cor · coronal · 0.86mm/px · 3 of 96 slices shown]
[im 32/96  soft-tissue]
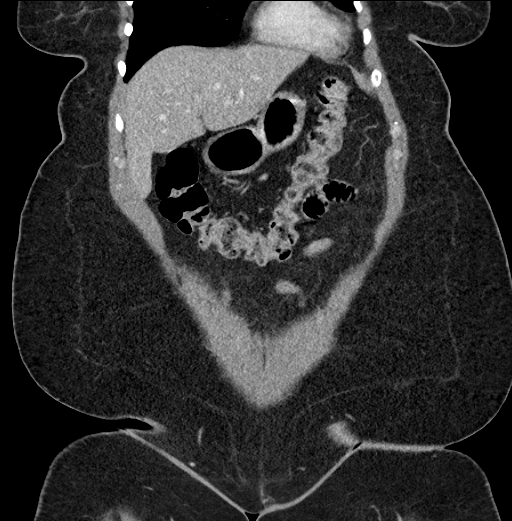
[im 43/96  soft-tissue]
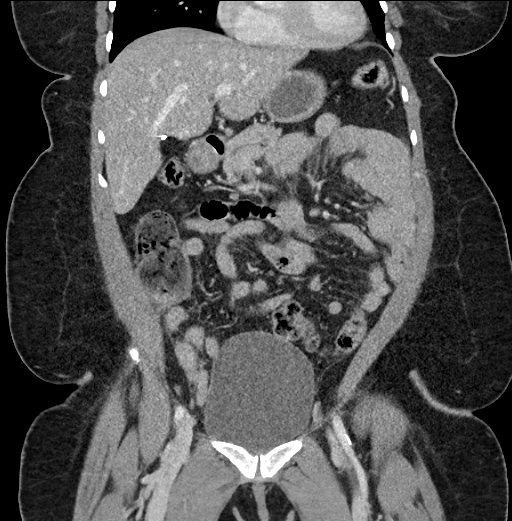
[im 53/96  soft-tissue]
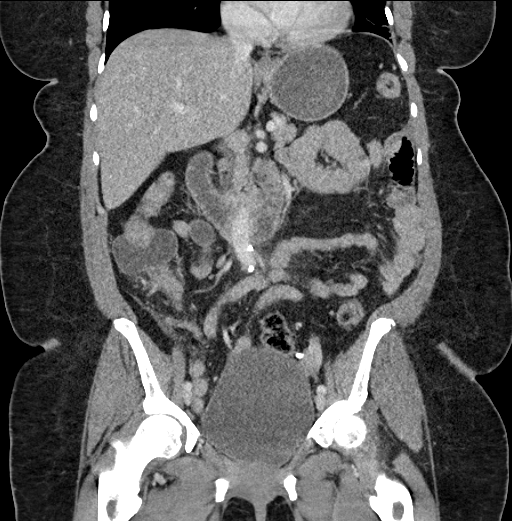

[17 of 46 positions shown; findings below may reference images not displayed]

FINDINGS: Lower chest: No acute abnormality.

Hepatobiliary: Mild fatty infiltration of the liver. No focal liver
abnormality is seen. Status post cholecystectomy. No biliary
dilatation.

Pancreas: Unremarkable. No pancreatic ductal dilatation or
surrounding inflammatory changes.

Spleen: Normal in size without focal abnormality.

Adrenals/Urinary Tract: Adrenal glands are unremarkable. Kidneys are
normal, without renal calculi, focal lesion, or hydronephrosis.
Bladder is unremarkable.

Stomach/Bowel: Stomach and small bowel are normal.

There is acute inflammation surrounding the appendix.  No abscess.

Remainder of the colon is unremarkable.

Vascular/Lymphatic: The abdominal aorta is normal in caliber with
mild atherosclerotic calcification. No adenopathy in the abdomen or
pelvis.

Reproductive: Status post hysterectomy. No adnexal masses.

Other: Small volume ascites.

Musculoskeletal: No significant skeletal lesion.
IMPRESSION: Acute appendicitis. These results were called by telephone at the
time of interpretation on 07/21/2017 at [DATE] to Dr. Kaki , who
verbally acknowledged these results.

## 2018-10-09 NOTE — Progress Notes (Signed)
[facility] - Clinic Note  10/10/2018     CHIEF COMPLAINT Patient presents for Retina Evaluation   HISTORY OF PRESENT ILLNESS: Dawn Rice is a 62 y.o. female who presents to the clinic today for:   HPI    Retina Evaluation    In both eyes.  Duration of 8 days.  Associated Symptoms Negative for Flashes, Blind Spot, Photophobia, Scalp Tenderness, Fever, Weight Loss, Jaw Claudication, Glare, Pain, Floaters, Fatigue, Shoulder/Hip pain, Trauma, Redness and Distortion.  Context:  distance vision, mid-range vision and near vision.  Treatments tried include no treatments.  I, the attending physician,  performed the HPI with the patient and updated documentation appropriately.          Comments    Pt presents on the referral of Dr. Wynona Luna for concern of retinal hole/lattice degeneration, pt states she saw Dr. Hanley Seamen for routine eye exam, pt states she has had cataract sx OU, performed by Dr. Dione Booze and another dr, pt denies flashes, floaters, pain or wavy vision, pt endorses being diabetic, pts last A1C was 7.0, her last blood sugar was 124 checked this morning, pt uses Restasis, pazeo, Refresh and Systane gel gtts       Last edited by Rennis Chris, MD on 10/10/2018 10:01 AM. (History)    pt states Dr. Hanley Seamen thought he saw "something" in her eyes he wanted checked out, pt states Dr. Darel Hong did cat sx OD and Dr. Marchelle Gearing did OS, pt states she has been seeing flashing lights OS ever since she had cat sx, she states she sees them at least once a day in her temporal visual field  Referring physician: Francene Boyers, MD [address] [place], [place] [address]  HISTORICAL INFORMATION:   Selected notes from the MEDICAL RECORD NUMBER Referred by Dr. Wynona Luna for concern of retinal hole and lattice degeneration LEE: 10.10.19 (S. Bernstorf) [BCVA: OD: 20/20 OS: 20/20] Ocular Hx-DES, pseudo OU, ocular HTN,  PMH-DM (A1C: 7.0, takes  Janumet)    CURRENT MEDICATIONS: Current Outpatient Medications (Ophthalmic Drugs)  Medication Sig  . PAZEO 0.7 % SOLN INT 1 GTT INTO OU QPM  . RESTASIS 0.05 % ophthalmic emulsion INT 1 GTT IN EACH EYE BID   No current facility-administered medications for this visit.  (Ophthalmic Drugs)   Current Outpatient Medications (Other)  Medication Sig  . allopurinol (ZYLOPRIM) 100 MG tablet Take 100 mg by mouth daily.  Marland Kitchen amLODipine (NORVASC) 2.5 MG tablet Take 2 tablets (5 mg total) by mouth daily.  Marland Kitchen atorvastatin (LIPITOR) 80 MG tablet Take 80 mg by mouth daily.  . carbamazepine (CARBATROL) 200 MG 12 hr capsule Take 200 mg by mouth daily.  . ciprofloxacin (CIPRO) 500 MG tablet Take 1 tablet (500 mg total) by mouth 2 (two) times daily.  . cyclobenzaprine (FLEXERIL) 10 MG tablet Take 1 tablet (10 mg total) by mouth 2 (two) times daily as needed for muscle spasms.  . dapagliflozin propanediol (FARXIGA) 5 MG TABS tablet Take 5 mg by mouth daily.  Marland Kitchen lisinopril (PRINIVIL,ZESTRIL) 40 MG tablet Take 40 mg by mouth daily.  . metoprolol tartrate (LOPRESSOR) 25 MG tablet Take 25 mg by mouth 2 (two) times daily.  . metroNIDAZOLE (FLAGYL) 500 MG tablet Take 1 tablet (500 mg total) by mouth every 8 (eight) hours.  . SitaGLIPtin-MetFORMIN HCl (JANUMET XR) 989 [id number] MG TB24 Take 1 tablet by mouth daily with supper.  Marland Kitchen SYNJARDY 12.04-999 MG TABS TK 1 T PO BID  .  traMADol (ULTRAM) 50 MG tablet Take 1 tablet (50 mg total) by mouth every 6 (six) hours as needed (mild pain). (Patient not taking: Reported on 03/04/2018)   No current facility-administered medications for this visit.  (Other)      REVIEW OF SYSTEMS:    ALLERGIES Allergies  Allergen Reactions  . Penicillins Itching    Has patient had a PCN reaction causing immediate rash, facial/tongue/throat swelling, SOB or lightheadedness with hypotension: No Has patient had a PCN reaction causing severe rash involving mucus membranes or skin necrosis:  No Has patient had a PCN reaction that required hospitalization No Has patient had a PCN reaction occurring within the last 10 years: No If all of the above answers are "NO", then may proceed with Cephalosporin use.    PAST MEDICAL HISTORY Past Medical History:  Diagnosis Date  . Cataract   . Epilepsy (HCC)   . Gout   . History of blood transfusion    "when I got my hysterectomy" (07/22/2017)  . Hyperlipidemia   . Hypertension   . Seizures (HCC)    "from epilepsy; the kind where I space out; last one was ~ 1 yr ago" (07/22/2017)  . Type II diabetes mellitus (HCC)    Past Surgical History:  Procedure Laterality Date  . ABDOMINAL HYSTERECTOMY    . APPENDECTOMY    . CATARACT EXTRACTION    . CATARACT EXTRACTION W/ INTRAOCULAR LENS  IMPLANT, BILATERAL Bilateral 2016-2017   right-left  . CHOLECYSTECTOMY  03/2009   lap/notes 04/25/2011  . LAPAROSCOPIC APPENDECTOMY N/A 07/21/2017   Procedure: APPENDECTOMY LAPAROSCOPIC;  Surgeon: Manus Rudd, MD;  Location: [facility] OR;  Service: General;  Laterality: N/A;  . TUBAL LIGATION      FAMILY HISTORY Family History  Problem Relation Age of Onset  . Diabetes Mother   . Hypertension Mother   . Heart failure Mother   . Cataracts Mother   . Diabetes Father   . Coronary artery disease Father   . Retinitis pigmentosa Sister   . Amblyopia Neg Hx   . Blindness Neg Hx   . Glaucoma Neg Hx   . Macular degeneration Neg Hx   . Retinal detachment Neg Hx   . Strabismus Neg Hx     SOCIAL HISTORY Social History   Tobacco Use  . Smoking status: Former Smoker    Packs/day: 0.50    Years: 20.00    Pack years: 10.00    Types: Cigarettes    Last attempt to quit: 2009    Years since quitting: 10.8  . Smokeless tobacco: Never Used  Substance Use Topics  . Alcohol use: Yes    Comment: 07/22/2017 "glass of wine maybe once/month"  . Drug use: No         OPHTHALMIC EXAM:  Base Eye Exam    Visual Acuity (Snellen - Linear)      Right Left    Dist cc 20/30 20/20   Dist ph cc NI    Correction:  Glasses       Tonometry (Tonopen, 9:18 AM)      Right Left   Pressure 22 23       Tonometry #2 (Tonopen, 9:18 AM)      Right Left   Pressure 21 23       Pupils      Dark Light Shape React APD   Right 4 2 Round Brisk None   Left 4 2 Round Brisk None       Visual Fields (  Counting fingers)      Left Right    Full Full       Extraocular Movement      Right Left    Full, Ortho Full, Ortho       Neuro/Psych    Oriented x3:  Yes   Mood/Affect:  Normal       Dilation    Both eyes:  1.0% Mydriacyl, 2.5% Phenylephrine @ 9:18 AM        Slit Lamp and Fundus Exam    Slit Lamp Exam      Right Left   Lids/Lashes Dermatochalasis - upper lid Dermatochalasis - upper lid   Conjunctiva/Sclera Melanosis Melanosis   Cornea 1+ Punctate epithelial erosions 1+ Punctate epithelial erosions 1+ EVND   Anterior Chamber deep and clear Narrow angle deep and clear Narrow angle   Iris Round and dilated Round and dilated   Lens Posterior chamber intraocular lens with trace PCO Posterior chamber intraocular lens in excellent position   Vitreous Vitreous syneresis Vitreous syneresis, vitreous condensation superiorly       Fundus Exam      Right Left   Disc Pink and Sharp Pink and Sharp   C/D Ratio 0.3 0.4   Macula Good foveal reflex, mild RPE mottling, scattered drusen, No heme or edema,  Good foveal reflex, mild RPE mottling, scattered drusen, No heme or edema   Vessels Copper wiring, Vascular attenuation, AV crossing changes Copper wiring, Vascular attenuation, AV crossing changes   Periphery Attached, mild lattice from 1100-1200 Attached, superior lattice very peripheral from 1100-0100        Refraction    Wearing Rx      Sphere Cylinder Axis Add   Right -1.00 +0.25 078 +2.50   Left [place] +1.00 135 +2.50   Type:  PAL       Manifest Refraction      Sphere Cylinder Axis Dist VA   Right -1.50 +0.75 078 20/20   Left               IMAGING AND PROCEDURES  Imaging and Procedures for @TODAY @  OCT, Retina - OU - Both Eyes       Right Eye Quality was good. Central Foveal Thickness: 256. Progression has no prior data. Findings include normal foveal contour, no SRF, no IRF, vitreomacular adhesion .   Left Eye Central Foveal Thickness: 257. Progression has no prior data. Findings include normal foveal contour, no SRF, no IRF, retinal drusen , vitreomacular adhesion .   Notes *Images captured and stored on drive  Diagnosis / Impression:  OU: NFP, no IRF/SRF OS: retinal drusen   Clinical management:  See below  Abbreviations: NFP - Normal foveal profile. CME - cystoid macular edema. PED - pigment epithelial detachment. IRF - intraretinal fluid. SRF - subretinal fluid. EZ - ellipsoid zone. ERM - epiretinal membrane. ORA - outer retinal atrophy. ORT - outer retinal tubulation. 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 Diabetes mellitus type 2 without retinopathy (HCC) E11.9   4. Essential hypertension I10   5. Hypertensive retinopathy of both eyes H35.033   6. Retinal edema H35.81 OCT, Retina - OU - Both Eyes  7. Pseudophakia of both eyes Z96.1     1,2. Lattice degeneration w/ atrophic holes, both eyes -  patches of mild lattice with small atrophic retinal holes at 1200 oclock OU - no other RT or RD on scleral depressed exam OU - discussed findings, prognosis, and treatment options including observation - recommend laser retinopexy OU - pt wishes to proceed with laser, but unable to undergo laser procedure today - f/u in 1-2 wks for laser retinopexy OD, then 2 wks for OS  3.Diabetes mellitus, type 2 without retinopathy - The incidence, risk factors for progression, natural history and treatment options for diabetic retinopathy  were discussed with patient.    - The need for close monitoring of blood glucose, blood pressure, and serum lipids, avoiding cigarette or any type of tobacco, and the need for long term follow up was also discussed with patient. - f/u in 1 year, sooner prn  4,5. Hypertensive retinopathy OU - discussed importance of tight BP control - monitor  6. No retinal edema on exam or OCT  7. Pseudophakia OU  - s/p CE/IOL OU (Bevis OD, C. Groat OS)  - beautiful surgeries, doing well  - monitor   Ophthalmic Meds Ordered this visit:  No orders of the defined types were placed in this encounter.      Return 1-2 weeks, for laser retinopexy.  There are no Patient Instructions on file for this visit.   Explained the diagnoses, plan, and follow up with the patient and they expressed understanding.  Patient expressed understanding of the importance of proper follow up care.   This document serves as a record of services personally performed by Karie Chimera, MD, PhD. It was created on their behalf by Laurian Brim, OA, an ophthalmic assistant. The creation of this record is the provider's dictation and/or activities during the visit.    Electronically signed by: Laurian Brim, OA  10.17.19 6:18 PM    Karie Chimera, M.D., Ph.D. Diseases & Surgery of the Retina and Vitreous [facility]   I have reviewed the above documentation for accuracy and completeness, and I agree with the above. Karie Chimera, M.D., Ph.D. 10/12/18 6:18 PM    Abbreviations: M myopia (nearsighted); A astigmatism; H hyperopia (farsighted); P presbyopia; Mrx spectacle prescription;  CTL contact lenses; OD right eye; OS left eye; OU both eyes  XT exotropia; ET esotropia; PEK punctate epithelial keratitis; PEE punctate epithelial erosions; DES dry eye syndrome; MGD meibomian gland dysfunction; ATs artificial tears; PFAT's preservative free artificial tears; NSC nuclear sclerotic cataract; PSC posterior subcapsular cataract; ERM  epi-retinal membrane; PVD posterior vitreous detachment; RD retinal detachment; DM diabetes mellitus; DR diabetic retinopathy; NPDR non-proliferative diabetic retinopathy; PDR proliferative diabetic retinopathy; CSME clinically significant macular edema; DME diabetic macular edema; dbh dot blot hemorrhages; CWS cotton wool spot; POAG primary open angle glaucoma; C/D cup-to-disc ratio; HVF humphrey visual field; GVF goldmann visual field; OCT optical coherence tomography; IOP intraocular pressure; BRVO Branch retinal vein occlusion; CRVO central retinal vein occlusion; CRAO central retinal artery occlusion; BRAO branch retinal artery occlusion; RT retinal tear; SB scleral buckle; PPV pars plana vitrectomy; VH Vitreous hemorrhage; PRP panretinal laser photocoagulation; IVK intravitreal kenalog; VMT vitreomacular traction; MH Macular hole;  NVD neovascularization of the disc; NVE neovascularization elsewhere; AREDS age related eye disease study; ARMD age related macular degeneration; POAG primary open angle glaucoma; EBMD epithelial/anterior basement membrane dystrophy; ACIOL anterior chamber intraocular lens; IOL intraocular lens; PCIOL posterior chamber intraocular lens; Phaco/IOL phacoemulsification with intraocular lens placement; PRK photorefractive keratectomy; LASIK laser assisted in situ keratomileusis; HTN hypertension; DM diabetes mellitus; COPD  chronic obstructive pulmonary disease  

## 2018-10-10 ENCOUNTER — Ambulatory Visit (INDEPENDENT_AMBULATORY_CARE_PROVIDER_SITE_OTHER): Payer: Medicare Other | Admitting: Ophthalmology

## 2018-10-10 ENCOUNTER — Encounter (INDEPENDENT_AMBULATORY_CARE_PROVIDER_SITE_OTHER): Payer: Self-pay | Admitting: Ophthalmology

## 2018-10-10 DIAGNOSIS — E119 Type 2 diabetes mellitus without complications: Secondary | ICD-10-CM

## 2018-10-10 DIAGNOSIS — H33323 Round hole, bilateral: Secondary | ICD-10-CM | POA: Diagnosis not present

## 2018-10-10 DIAGNOSIS — H3581 Retinal edema: Secondary | ICD-10-CM | POA: Diagnosis not present

## 2018-10-10 DIAGNOSIS — H35413 Lattice degeneration of retina, bilateral: Secondary | ICD-10-CM

## 2018-10-10 DIAGNOSIS — Z961 Presence of intraocular lens: Secondary | ICD-10-CM

## 2018-10-10 DIAGNOSIS — I10 Essential (primary) hypertension: Secondary | ICD-10-CM

## 2018-10-10 DIAGNOSIS — H35033 Hypertensive retinopathy, bilateral: Secondary | ICD-10-CM

## 2018-10-12 ENCOUNTER — Encounter (INDEPENDENT_AMBULATORY_CARE_PROVIDER_SITE_OTHER): Payer: Self-pay | Admitting: Ophthalmology

## 2018-10-16 NOTE — Progress Notes (Signed)
[facility] - Clinic Note  10/17/2018     CHIEF COMPLAINT Patient presents for Retina Follow Up   HISTORY OF PRESENT ILLNESS: Dawn Rice is a 62 y.o. female who presents to the clinic today for:   HPI    Retina Follow Up    Patient presents with  Other.  In both eyes.  This started 2 weeks ago.  Since onset it is stable.  I, the attending physician,  performed the HPI with the patient and updated documentation appropriately.          Comments    F/U Lattice degen. OU. Patient states her vision still the same ,VA is "blurry" and occasional "floaters". Patient is ready tx today       Last edited by Rennis Chris, MD on 10/17/2018  9:07 AM. (History)      Referring physician: Ralene Ok, MD [id number] [place], [place] [address]  HISTORICAL INFORMATION:   Selected notes from the MEDICAL RECORD NUMBER Referred by Dr. Wynona Luna for concern of retinal hole and lattice degeneration LEE: 10.10.19 (S. Bernstorf) [BCVA: OD: 20/20 OS: 20/20] Ocular Hx-DES, pseudo OU, ocular HTN,  PMH-DM (A1C: 7.0, takes Janumet)    CURRENT MEDICATIONS: Current Outpatient Medications (Ophthalmic Drugs)  Medication Sig  . PAZEO 0.7 % SOLN INT 1 GTT INTO OU QPM  . RESTASIS 0.05 % ophthalmic emulsion INT 1 GTT IN EACH EYE BID  . prednisoLONE acetate (PRED FORTE) 1 % ophthalmic suspension Place 1 drop into the right eye 4 (four) times daily for 7 days.   No current facility-administered medications for this visit.  (Ophthalmic Drugs)   Current Outpatient Medications (Other)  Medication Sig  . allopurinol (ZYLOPRIM) 100 MG tablet Take 100 mg by mouth daily.  Marland Kitchen amLODipine (NORVASC) 2.5 MG tablet Take 2 tablets (5 mg total) by mouth daily.  Marland Kitchen atorvastatin (LIPITOR) 80 MG tablet Take 80 mg by mouth daily.  . carbamazepine (CARBATROL) 200 MG 12 hr capsule Take 200 mg by mouth daily.  . ciprofloxacin (CIPRO) 500 MG tablet Take 1 tablet (500 mg total) by mouth 2  (two) times daily.  . cyclobenzaprine (FLEXERIL) 10 MG tablet Take 1 tablet (10 mg total) by mouth 2 (two) times daily as needed for muscle spasms.  . dapagliflozin propanediol (FARXIGA) 5 MG TABS tablet Take 5 mg by mouth daily.  Marland Kitchen lisinopril (PRINIVIL,ZESTRIL) 40 MG tablet Take 40 mg by mouth daily.  . metoprolol tartrate (LOPRESSOR) 25 MG tablet Take 25 mg by mouth 2 (two) times daily.  . metroNIDAZOLE (FLAGYL) 500 MG tablet Take 1 tablet (500 mg total) by mouth every 8 (eight) hours.  . SitaGLIPtin-MetFORMIN HCl (JANUMET XR) [phone] MG TB24 Take 1 tablet by mouth daily with supper.  Marland Kitchen SYNJARDY [phone] MG TABS TK 1 T PO BID  . traMADol (ULTRAM) 50 MG tablet Take 1 tablet (50 mg total) by mouth every 6 (six) hours as needed (mild pain).   No current facility-administered medications for this visit.  (Other)      REVIEW OF SYSTEMS: ROS    Positive for: Endocrine, Eyes   Negative for: Constitutional, Gastrointestinal, Neurological, Skin, Genitourinary, Musculoskeletal, HENT, Cardiovascular, Respiratory, Psychiatric, Allergic/Imm, Heme/Lymph   Last edited by Eldridge Scot, LPN on 96/29/5284  8:41 AM. (History)       ALLERGIES Allergies  Allergen Reactions  . Penicillins Itching    Has patient had a PCN reaction causing immediate rash, facial/tongue/throat swelling, SOB or lightheadedness with hypotension: No  Has patient had a PCN reaction causing severe rash involving mucus membranes or skin necrosis: No Has patient had a PCN reaction that required hospitalization No Has patient had a PCN reaction occurring within the last 10 years: No If all of the above answers are "NO", then may proceed with Cephalosporin use.    PAST MEDICAL HISTORY Past Medical History:  Diagnosis Date  . Cataract   . Epilepsy (HCC)   . Gout   . History of blood transfusion    "when I got my hysterectomy" (07/22/2017)  . Hyperlipidemia   . Hypertension   . Seizures (HCC)    "from epilepsy; the  kind where I space out; last one was ~ 1 yr ago" (07/22/2017)  . Type II diabetes mellitus (HCC)    Past Surgical History:  Procedure Laterality Date  . ABDOMINAL HYSTERECTOMY    . APPENDECTOMY    . CATARACT EXTRACTION    . CATARACT EXTRACTION W/ INTRAOCULAR LENS  IMPLANT, BILATERAL Bilateral 2016-2017   right-left  . CHOLECYSTECTOMY  03/2009   lap/notes 04/25/2011  . LAPAROSCOPIC APPENDECTOMY N/A 07/21/2017   Procedure: APPENDECTOMY LAPAROSCOPIC;  Surgeon: Manus Rudd, MD;  Location: [facility] OR;  Service: General;  Laterality: N/A;  . TUBAL LIGATION      FAMILY HISTORY Family History  Problem Relation Age of Onset  . Diabetes Mother   . Hypertension Mother   . Heart failure Mother   . Cataracts Mother   . Diabetes Father   . Coronary artery disease Father   . Retinitis pigmentosa Sister   . Amblyopia Neg Hx   . Blindness Neg Hx   . Glaucoma Neg Hx   . Macular degeneration Neg Hx   . Retinal detachment Neg Hx   . Strabismus Neg Hx     SOCIAL HISTORY Social History   Tobacco Use  . Smoking status: Former Smoker    Packs/day: 0.50    Years: 20.00    Pack years: 10.00    Types: Cigarettes    Last attempt to quit: 2009    Years since quitting: 10.8  . Smokeless tobacco: Never Used  Substance Use Topics  . Alcohol use: Yes    Comment: 07/22/2017 "glass of wine maybe once/month"  . Drug use: No         OPHTHALMIC EXAM:  Base Eye Exam    Visual Acuity (Snellen - Linear)      Right Left   Dist cc 20/25 20/20   Dist ph cc NI NI   Correction:  Glasses       Tonometry (Tonopen, 8:47 AM)      Right Left   Pressure 15 15       Pupils      Dark Light Shape React APD   Right 3 2 Round Brisk None   Left 3 2 Round Brisk None       Visual Fields (Counting fingers)      Left Right    Full Full       Extraocular Movement      Right Left    Full Full       Neuro/Psych    Oriented x3:  Yes   Mood/Affect:  Normal       Dilation    Both eyes:  1.0%  Mydriacyl, 2.5% Phenylephrine @ 8:47 AM        Slit Lamp and Fundus Exam    Slit Lamp Exam      Right Left   Lids/Lashes  Dermatochalasis - upper lid Dermatochalasis - upper lid   Conjunctiva/Sclera Melanosis Melanosis   Cornea 1+ Punctate epithelial erosions 1+ Punctate epithelial erosions 1+ EVND   Anterior Chamber deep and clear Narrow angle deep and clear Narrow angle   Iris Round and dilated Round and dilated   Lens Posterior chamber intraocular lens with trace PCO Posterior chamber intraocular lens in excellent position   Vitreous Vitreous syneresis Vitreous syneresis, vitreous condensation superiorly       Fundus Exam      Right Left   Disc Pink and Sharp Pink and Sharp   C/D Ratio 0.3 0.4   Macula Good foveal reflex, mild RPE mottling, scattered drusen, No heme or edema,  Good foveal reflex, mild RPE mottling, scattered drusen, No heme or edema   Vessels Copper wiring, Vascular attenuation, AV crossing changes Copper wiring, Vascular attenuation, AV crossing changes   Periphery Attached, mild lattice from 1100-1200 Attached, superior lattice very peripheral from 1100-0100          IMAGING AND PROCEDURES  Imaging and Procedures for @TODAY @  OCT, Retina - OU - Both Eyes       Right Eye Quality was good. Central Foveal Thickness: 253. Progression has been stable. Findings include normal foveal contour, no SRF, no IRF, vitreomacular adhesion .   Left Eye Central Foveal Thickness: 255. Progression has been stable. Findings include normal foveal contour, no SRF, no IRF, retinal drusen , vitreomacular adhesion .   Notes *Images captured and stored on drive  Diagnosis / Impression:  OU: NFP, no IRF/SRF OS: retinal drusen   Clinical management:  See below  Abbreviations: NFP - Normal foveal profile. CME - cystoid macular edema. PED - pigment epithelial detachment. IRF - intraretinal fluid. SRF - subretinal fluid. EZ - ellipsoid zone. ERM - epiretinal membrane. ORA -  outer retinal atrophy. ORT - outer retinal tubulation. SRHM - subretinal hyper-reflective material        Repair Retinal Breaks, Laser - OD - Right Eye       LASER PROCEDURE NOTE  Procedure:  Barrier laser retinopexy using slit lamp laser, RIGHT eye   Diagnosis:   Lattice degeneration w/ atrophic retinal holes, RIGHT eye                     1030-12 o'clock anterior to equator   Surgeon: Rennis Chris, MD, PhD  Anesthesia: Topical  Informed consent obtained, operative eye marked, and time out performed prior to initiation of laser.   Laser settings:  Lumenis Smart532 laser, slit lamp Lens: Mainster PRP 165 Power: 230 mW Spot size: 200 microns Duration: 30 msec  # spots: 494  Placement of laser: Using a Mainster PRP 165 contact lens at the slit lamp, laser was placed in three confluent rows around lattice from 1030-1200 anterior to equator.  Complications: None.  Patient tolerated the procedure well and received written and verbal post-procedure care information/education.                  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 Diabetes mellitus type 2 without retinopathy (HCC) E11.9   4. Essential hypertension I10   5. Hypertensive retinopathy of both eyes H35.033   6. Retinal edema H35.81 OCT, Retina - OU - Both Eyes  7. Pseudophakia of both  eyes Z96.1     1,2. Lattice degeneration w/ atrophic holes, both eyes - patches of mild lattice with small atrophic retinal holes at 1200 oclock OU - no other RT or RD on scleral depressed exam OU - discussed findings, prognosis, and treatment options including observation - recommend laser retinopexy OD today, 10.25.19 - RBA of procedure discussed, questions answered - informed consent obtained and signed - see procedure note - f/u 2 wks for  laser retinopexy OS - start PF QID OD x7 days  3.Diabetes mellitus, type 2 without retinopathy - The incidence, risk factors for progression, natural history and treatment options for diabetic retinopathy  were discussed with patient.   - The need for close monitoring of blood glucose, blood pressure, and serum lipids, avoiding cigarette or any type of tobacco, and the need for long term follow up was also discussed with patient. - f/u in 1 year, sooner prn  4,5. Hypertensive retinopathy OU - discussed importance of tight BP control - monitor  6. No retinal edema on exam or OCT  7. Pseudophakia OU  - s/p CE/IOL OU (Bevis OD, C. Groat OS)  - beautiful surgeries, doing well  - monitor   Ophthalmic Meds Ordered this visit:  Meds ordered this encounter  Medications  . prednisoLONE acetate (PRED FORTE) 1 % ophthalmic suspension    Sig: Place 1 drop into the right eye 4 (four) times daily for 7 days.    Dispense:  10 mL    Refill:  0       Return in about 2 weeks (around 10/31/2018) for s/p laser retinopexy OD, laser retinopexy OS.  There are no Patient Instructions on file for this visit.   Explained the diagnoses, plan, and follow up with the patient and they expressed understanding.  Patient expressed understanding of the importance of proper follow up care.   This document serves as a record of services personally performed by Karie Chimera, MD, PhD. It was created on their behalf by Laurian Brim, OA, an ophthalmic assistant. The creation of this record is the provider's dictation and/or activities during the visit.    Electronically signed by: Laurian Brim, OA  10.24.19 9:28 AM     Karie Chimera, M.D., Ph.D. Diseases & Surgery of the Retina and Vitreous [facility]   I have reviewed the above documentation for accuracy and completeness, and I agree with the above. Karie Chimera, M.D., Ph.D. 10/17/18 9:29 AM    Abbreviations: M myopia  (nearsighted); A astigmatism; H hyperopia (farsighted); P presbyopia; Mrx spectacle prescription;  CTL contact lenses; OD right eye; OS left eye; OU both eyes  XT exotropia; ET esotropia; PEK punctate epithelial keratitis; PEE punctate epithelial erosions; DES dry eye syndrome; MGD meibomian gland dysfunction; ATs artificial tears; PFAT's preservative free artificial tears; NSC nuclear sclerotic cataract; PSC posterior subcapsular cataract; ERM epi-retinal membrane; PVD posterior vitreous detachment; RD retinal detachment; DM diabetes mellitus; DR diabetic retinopathy; NPDR non-proliferative diabetic retinopathy; PDR proliferative diabetic retinopathy; CSME clinically significant macular edema; DME diabetic macular edema; dbh dot blot hemorrhages; CWS cotton wool spot; POAG primary open angle glaucoma; C/D cup-to-disc ratio; HVF humphrey visual field; GVF goldmann visual field; OCT optical coherence tomography; IOP intraocular pressure; BRVO Branch retinal vein occlusion; CRVO central retinal vein occlusion; CRAO central retinal artery occlusion; BRAO branch retinal artery occlusion; RT retinal tear; SB scleral buckle; PPV pars plana vitrectomy; VH Vitreous hemorrhage; PRP panretinal laser photocoagulation; IVK intravitreal kenalog; VMT vitreomacular  traction; MH Macular hole;  NVD neovascularization of the disc; NVE neovascularization elsewhere; AREDS age related eye disease study; ARMD age related macular degeneration; POAG primary open angle glaucoma; EBMD epithelial/anterior basement membrane dystrophy; ACIOL anterior chamber intraocular lens; IOL intraocular lens; PCIOL posterior chamber intraocular lens; Phaco/IOL phacoemulsification with intraocular lens placement; Hollister photorefractive keratectomy; LASIK laser assisted in situ keratomileusis; HTN hypertension; DM diabetes mellitus; COPD chronic obstructive pulmonary disease

## 2018-10-17 ENCOUNTER — Ambulatory Visit (INDEPENDENT_AMBULATORY_CARE_PROVIDER_SITE_OTHER): Payer: Medicare Other | Admitting: Ophthalmology

## 2018-10-17 ENCOUNTER — Encounter (INDEPENDENT_AMBULATORY_CARE_PROVIDER_SITE_OTHER): Payer: Self-pay | Admitting: Ophthalmology

## 2018-10-17 DIAGNOSIS — H35033 Hypertensive retinopathy, bilateral: Secondary | ICD-10-CM

## 2018-10-17 DIAGNOSIS — Z961 Presence of intraocular lens: Secondary | ICD-10-CM

## 2018-10-17 DIAGNOSIS — H35413 Lattice degeneration of retina, bilateral: Secondary | ICD-10-CM | POA: Diagnosis not present

## 2018-10-17 DIAGNOSIS — H3581 Retinal edema: Secondary | ICD-10-CM

## 2018-10-17 DIAGNOSIS — H33323 Round hole, bilateral: Secondary | ICD-10-CM

## 2018-10-17 DIAGNOSIS — I10 Essential (primary) hypertension: Secondary | ICD-10-CM

## 2018-10-17 DIAGNOSIS — E119 Type 2 diabetes mellitus without complications: Secondary | ICD-10-CM

## 2018-10-17 MED ORDER — PREDNISOLONE ACETATE 1 % OP SUSP: 1.0000 [drp] | Freq: Four times a day (QID) | OPHTHALMIC | 0 refills | Status: DC

## 2018-10-21 ENCOUNTER — Other Ambulatory Visit (INDEPENDENT_AMBULATORY_CARE_PROVIDER_SITE_OTHER): Payer: Self-pay | Admitting: Ophthalmology

## 2018-11-05 NOTE — Progress Notes (Signed)
[facility] - Clinic Note  11/07/2018     CHIEF COMPLAINT Patient presents for Post-op Follow-up   HISTORY OF PRESENT ILLNESS: Dawn Rice is a 62 y.o. female who presents to the clinic today for:   HPI    Post-op Follow-up    In right eye.  Discomfort includes none.  Vision is stable.  I, the attending physician,  performed the HPI with the patient and updated documentation appropriately.          Comments    62 y/o female pt.  Lattice OU.  S/p laser retinopexy OD 10.25.19.  Here for laser retinopexy OS.  [place] change in [place] OU.  Denies pain, floaters, but sees intermittent temporal flashes OU.  Not currently using any gtts.       Last edited by Rennis ChrisZamora, Josede Cicero, MD on 11/07/2018  9:16 AM. (History)      Referring physician: Ralene OkMoreira, Roy, MD [address] [place], [place] [id number]  HISTORICAL INFORMATION:   Selected notes from the MEDICAL RECORD NUMBER Referred by Dr. Wynona LunaSteven Bernstorf for concern of retinal hole and lattice degeneration LEE: 10.10.19 (S. Bernstorf) [BCVA: OD: 20/20 OS: 20/20] Ocular Hx-DES, pseudo OU, ocular HTN,  PMH-DM (A1C: 7.0, takes Janumet)    CURRENT MEDICATIONS: Current Outpatient Medications (Ophthalmic Drugs)  Medication Sig  . PAZEO 0.7 % SOLN INT 1 GTT INTO OU QPM  . prednisoLONE acetate (PRED FORTE) 1 % ophthalmic suspension SHAKE LIQUID AND INSTILL 1 DROP IN RIGHT EYE FOUR TIMES DAILY FOR 7 DAYS  . RESTASIS 0.05 % ophthalmic emulsion INT 1 GTT IN EACH EYE BID   No current facility-administered medications for this visit.  (Ophthalmic Drugs)   Current Outpatient Medications (Other)  Medication Sig  . allopurinol (ZYLOPRIM) 100 MG tablet Take 100 mg by mouth daily.  Marland Kitchen. amLODipine (NORVASC) 2.5 MG tablet Take 2 tablets (5 mg total) by mouth daily.  Marland Kitchen. atorvastatin (LIPITOR) 80 MG tablet Take 80 mg by mouth daily.  . carbamazepine (CARBATROL) 200 MG 12 hr capsule Take 200 mg by mouth daily.  . ciprofloxacin (CIPRO)  500 MG tablet Take 1 tablet (500 mg total) by mouth 2 (two) times daily.  . cyclobenzaprine (FLEXERIL) 10 MG tablet Take 1 tablet (10 mg total) by mouth 2 (two) times daily as needed for muscle spasms.  . dapagliflozin propanediol (FARXIGA) 5 MG TABS tablet Take 5 mg by mouth daily.  Marland Kitchen. lisinopril (PRINIVIL,ZESTRIL) 40 MG tablet Take 40 mg by mouth daily.  . methimazole (TAPAZOLE) 5 MG tablet TK 1 T PO BID  . metoprolol tartrate (LOPRESSOR) 25 MG tablet Take 25 mg by mouth 2 (two) times daily.  . metroNIDAZOLE (FLAGYL) 500 MG tablet Take 1 tablet (500 mg total) by mouth every 8 (eight) hours.  Letta Pate. ONETOUCH VERIO test strip U UTD  . rosuvastatin (CRESTOR) 10 MG tablet TK 1 T PO QD  . SitaGLIPtin-MetFORMIN HCl (JANUMET XR) [phone] MG TB24 Take 1 tablet by mouth daily with supper.  Marland Kitchen. SYNJARDY 12.04-999 MG TABS TK 1 T PO BID  . traMADol (ULTRAM) 50 MG tablet Take 1 tablet (50 mg total) by mouth every 6 (six) hours as needed (mild pain).   No current facility-administered medications for this visit.  (Other)      REVIEW OF SYSTEMS: ROS    Positive for: Endocrine, Eyes   Negative for: Constitutional, Gastrointestinal, Neurological, Skin, Genitourinary, Musculoskeletal, HENT, Cardiovascular, Respiratory, Psychiatric, Allergic/Imm, Heme/Lymph   Last edited by Celine MansBaxley, Andrew G, COA  on 11/07/2018  8:40 AM. (History)       ALLERGIES Allergies  Allergen Reactions  . Penicillins Itching    Has patient had a PCN reaction causing immediate rash, facial/tongue/throat swelling, SOB or lightheadedness with hypotension: No Has patient had a PCN reaction causing severe rash involving mucus membranes or skin necrosis: No Has patient had a PCN reaction that required hospitalization No Has patient had a PCN reaction occurring within the last 10 years: No If all of the above answers are "NO", then may proceed with Cephalosporin use.    PAST MEDICAL HISTORY Past Medical History:  Diagnosis Date  .  Cataract   . Epilepsy (HCC)   . Gout   . History of blood transfusion    "when I got my hysterectomy" (07/22/2017)  . Hyperlipidemia   . Hypertension   . Seizures (HCC)    "from epilepsy; the kind where I space out; last one was ~ 1 yr ago" (07/22/2017)  . Type II diabetes mellitus (HCC)    Past Surgical History:  Procedure Laterality Date  . ABDOMINAL HYSTERECTOMY    . APPENDECTOMY    . CATARACT EXTRACTION    . CATARACT EXTRACTION W/ INTRAOCULAR LENS  IMPLANT, BILATERAL Bilateral 2016-2017   right-left  . CHOLECYSTECTOMY  03/2009   lap/notes 04/25/2011  . LAPAROSCOPIC APPENDECTOMY N/A 07/21/2017   Procedure: APPENDECTOMY LAPAROSCOPIC;  Surgeon: Manus Rudd, MD;  Location: [facility] OR;  Service: General;  Laterality: N/A;  . TUBAL LIGATION      FAMILY HISTORY Family History  Problem Relation Age of Onset  . Diabetes Mother   . Hypertension Mother   . Heart failure Mother   . Cataracts Mother   . Diabetes Father   . Coronary artery disease Father   . Retinitis pigmentosa Sister   . Amblyopia Neg Hx   . Blindness Neg Hx   . Glaucoma Neg Hx   . Macular degeneration Neg Hx   . Retinal detachment Neg Hx   . Strabismus Neg Hx     SOCIAL HISTORY Social History   Tobacco Use  . Smoking status: Former Smoker    Packs/day: 0.50    Years: 20.00    Pack years: 10.00    Types: Cigarettes    Last attempt to quit: 2009    Years since quitting: 10.8  . Smokeless tobacco: Never Used  Substance Use Topics  . Alcohol use: Yes    Comment: 07/22/2017 "glass of wine maybe once/month"  . Drug use: No         OPHTHALMIC EXAM:  Base Eye Exam    Visual Acuity (Snellen - Linear)      Right Left   Dist cc 20/20 -2 20/20 -2   Correction:  Glasses       Tonometry (Tonopen, 8:49 AM)      Right Left   Pressure 15 15       Pupils      Dark Light Shape React APD   Right 3 2 Round Brisk None   Left 3 2 Round Brisk None       Visual Fields (Counting fingers)      Left Right     Full Full       Extraocular Movement      Right Left    Full, Ortho Full, Ortho       Neuro/Psych    Oriented x3:  Yes   Mood/Affect:  Normal       Dilation  Both eyes:  1.0% Mydriacyl, 2.5% Phenylephrine @ 8:49 AM        Slit Lamp and Fundus Exam    Slit Lamp Exam      Right Left   Lids/Lashes Dermatochalasis - upper lid Dermatochalasis - upper lid   Conjunctiva/Sclera Melanosis Melanosis   Cornea 1+ Punctate epithelial erosions 1+ Punctate epithelial erosions 1+ EVND   Anterior Chamber deep and clear Narrow angle deep and clear Narrow angle   Iris Round and dilated Round and dilated   Lens Posterior chamber intraocular lens with trace PCO Posterior chamber intraocular lens in excellent position   Vitreous Vitreous syneresis Vitreous syneresis, vitreous condensation superiorly       Fundus Exam      Right Left   Disc Pink and Sharp Pink and Sharp   C/D Ratio 0.3 0.4   Macula Good foveal reflex, mild RPE mottling, scattered drusen, No heme or edema,  Good foveal reflex, mild RPE mottling, scattered drusen, No heme or edema   Vessels Copper wiring, Vascular attenuation, AV crossing changes Copper wiring, Vascular attenuation, AV crossing changes   Periphery Attached, mild lattice from 1100-1200 -- good laser surrounding Attached, superior lattice very peripheral from 1100-0100 and at 0400          IMAGING AND PROCEDURES  Imaging and Procedures for @TODAY @  OCT, Retina - OU - Both Eyes       Right Eye Quality was good. Central Foveal Thickness: 261. Progression has been stable. Findings include normal foveal contour, no SRF, no IRF, vitreomacular adhesion .   Left Eye Central Foveal Thickness: 259. Progression has been stable. Findings include normal foveal contour, no SRF, no IRF, retinal drusen , vitreomacular adhesion .   Notes *Images captured and stored on drive  Diagnosis / Impression:  OU: NFP, no IRF/SRF OS: retinal druse   Clinical management:   See below  Abbreviations: NFP - Normal foveal profile. CME - cystoid macular edema. PED - pigment epithelial detachment. IRF - intraretinal fluid. SRF - subretinal fluid. EZ - ellipsoid zone. ERM - epiretinal membrane. ORA - outer retinal atrophy. ORT - outer retinal tubulation. SRHM - subretinal hyper-reflective material        Repair Retinal Breaks, Laser - OS - Left Eye       LASER PROCEDURE NOTE  Procedure:  Barrier laser retinopexy using slit lamp laser, LEFT eye   Diagnosis:   Lattice degeneration w/ atrophic holes, LEFT eye                     Patches of lattice: 1100-0100 superiorly; 0400 inferiorly  Surgeon: Rennis Chris, MD, PhD  Anesthesia: Topical  Informed consent obtained, operative eye marked, and time out performed prior to initiation of laser.   Laser settings:  Lumenis Smart532 laser, slit lamp Lens: Mainster PRP 165 Power: 230 mW Spot size: 200 microns Duration: 30 msec  # spots: 473  Placement of laser: Using a Mainster PRP 165 contact lens at the slit lamp, laser was placed in three confluent rows around patches of lattice w/ atrophic holes superiorly and at 0400 oclock anterior to equator.  Complications: None.  Patient tolerated the procedure well and received written and verbal post-procedure care information/education.                 ASSESSMENT/PLAN:    ICD-10-CM   1. Lattice degeneration of both retinas H35.413 Repair Retinal Breaks, Laser - OS - Left Eye  2. 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 Diabetes mellitus type 2 without retinopathy (HCC) E11.9   4. Essential hypertension I10   5. Hypertensive retinopathy of both eyes H35.033   6. Retinal edema H35.81 OCT, Retina - OU - Both Eyes  7. Pseudophakia of both eyes Z96.1     1,2. Lattice degeneration w/ atrophic holes, both eyes - patches of mild lattice with small atrophic retinal holes at 1200 oclock OU; OS with small additional  patch at 0400 - no other RT or RD on scleral depressed exam OU - discussed findings, prognosis, and treatment options including observation - S/P laser retinopexy OD (10.25.19) -- good laser in place - recommend laser retinopexy OS today, 11.15.19 - RBA of procedure discussed, questions answered - informed consent obtained and signed - see procedure note - f/u 2 wks - start PF QID OS x7 days  3.Diabetes mellitus, type 2 without retinopathy - The incidence, risk factors for progression, natural history and treatment options for diabetic retinopathy  were discussed with patient.   - The need for close monitoring of blood glucose, blood pressure, and serum lipids, avoiding cigarette or any type of tobacco, and the need for long term follow up was also discussed with patient. - f/u in 1 year, sooner prn  4,5. Hypertensive retinopathy OU - discussed importance of tight BP control - monitor  6. No retinal edema on exam or OCT  7. Pseudophakia OU  - s/p CE/IOL OU (Bevis OD, C. Groat OS)  - beautiful surgeries, doing well  - monitor   Ophthalmic Meds Ordered this visit:  No orders of the defined types were placed in this encounter.      Return in about 2 weeks (around 11/21/2018) for POV.  There are no Patient Instructions on file for this visit.   Explained the diagnoses, plan, and follow up with the patient and they expressed understanding.  Patient expressed understanding of the importance of proper follow up care.   This document serves as a record of services personally performed by Karie Chimera, MD, PhD. It was created on their behalf by Laurian Brim, OA, an ophthalmic assistant. The creation of this record is the provider's dictation and/or activities during the visit.    Electronically signed by: Laurian Brim, OA  11.13.19 9:42 AM    Karie Chimera, M.D., Ph.D. Diseases & Surgery of the Retina and Vitreous [facility]   I have reviewed the  above documentation for accuracy and completeness, and I agree with the above. Karie Chimera, M.D., Ph.D. 11/07/18 9:42 AM    Abbreviations: M myopia (nearsighted); A astigmatism; H hyperopia (farsighted); P presbyopia; Mrx spectacle prescription;  CTL contact lenses; OD right eye; OS left eye; OU both eyes  XT exotropia; ET esotropia; PEK punctate epithelial keratitis; PEE punctate epithelial erosions; DES dry eye syndrome; MGD meibomian gland dysfunction; ATs artificial tears; PFAT's preservative free artificial tears; NSC nuclear sclerotic cataract; PSC posterior subcapsular cataract; ERM epi-retinal membrane; PVD posterior vitreous detachment; RD retinal detachment; DM diabetes mellitus; DR diabetic retinopathy; NPDR non-proliferative diabetic retinopathy; PDR proliferative diabetic retinopathy; CSME clinically significant macular edema; DME diabetic macular edema; dbh dot blot hemorrhages; CWS cotton wool spot; POAG primary open angle glaucoma; C/D cup-to-disc ratio; HVF humphrey visual field; GVF goldmann visual field; OCT optical coherence tomography; IOP intraocular pressure; BRVO Branch retinal vein occlusion; CRVO central retinal vein occlusion; CRAO central retinal artery occlusion; BRAO branch retinal artery occlusion;  RT retinal tear; SB scleral buckle; PPV pars plana vitrectomy; VH Vitreous hemorrhage; PRP panretinal laser photocoagulation; IVK intravitreal kenalog; VMT vitreomacular traction; MH Macular hole;  NVD neovascularization of the disc; NVE neovascularization elsewhere; AREDS age related eye disease study; ARMD age related macular degeneration; POAG primary open angle glaucoma; EBMD epithelial/anterior basement membrane dystrophy; ACIOL anterior chamber intraocular lens; IOL intraocular lens; PCIOL posterior chamber intraocular lens; Phaco/IOL phacoemulsification with intraocular lens placement; PRK photorefractive keratectomy; LASIK laser assisted in situ keratomileusis; HTN  hypertension; DM diabetes mellitus; COPD chronic obstructive pulmonary disease

## 2018-11-07 ENCOUNTER — Ambulatory Visit (INDEPENDENT_AMBULATORY_CARE_PROVIDER_SITE_OTHER): Payer: Medicare Other | Admitting: Ophthalmology

## 2018-11-07 ENCOUNTER — Encounter (INDEPENDENT_AMBULATORY_CARE_PROVIDER_SITE_OTHER): Payer: Self-pay | Admitting: Ophthalmology

## 2018-11-07 DIAGNOSIS — E119 Type 2 diabetes mellitus without complications: Secondary | ICD-10-CM

## 2018-11-07 DIAGNOSIS — H35413 Lattice degeneration of retina, bilateral: Secondary | ICD-10-CM

## 2018-11-07 DIAGNOSIS — H3581 Retinal edema: Secondary | ICD-10-CM

## 2018-11-07 DIAGNOSIS — Z961 Presence of intraocular lens: Secondary | ICD-10-CM

## 2018-11-07 DIAGNOSIS — H35033 Hypertensive retinopathy, bilateral: Secondary | ICD-10-CM

## 2018-11-07 DIAGNOSIS — I10 Essential (primary) hypertension: Secondary | ICD-10-CM

## 2018-11-07 DIAGNOSIS — H33323 Round hole, bilateral: Secondary | ICD-10-CM

## 2018-12-01 NOTE — Progress Notes (Signed)
[facility] - Clinic Note  12/02/2018     CHIEF COMPLAINT Patient presents for Post-op Follow-up   HISTORY OF PRESENT ILLNESS: Dawn Rice is a 62 y.o. female who presents to the clinic today for:   HPI    Post-op Follow-up    In both eyes.  Discomfort includes floaters.  Vision is stable.  I, the attending physician,  performed the HPI with the patient and updated documentation appropriately.          Comments    62 y/o female pt here for 3 wk f/u for lattice OU (s/p laser OD 10.25.19 and laser OS on 11.15.19).  VA good OU.  Denies pain, but sees occasional temporal flashes.  Has an occasional floater OU.  Not currently using any gtts.       Last edited by Rennis Chris, MD on 12/02/2018  9:11 AM. (History)      Referring physician: Ralene Ok, MD [address] [place], [place] [address]  HISTORICAL INFORMATION:   Selected notes from the MEDICAL RECORD NUMBER Referred by Dr. Wynona Luna for concern of retinal hole and lattice degeneration LEE: 10.10.19 (S. Bernstorf) [BCVA: OD: 20/20 OS: 20/20] Ocular Hx-DES, pseudo OU, ocular HTN,  PMH-DM (A1C: 7.0, takes Janumet)    CURRENT MEDICATIONS: Current Outpatient Medications (Ophthalmic Drugs)  Medication Sig  . PAZEO 0.7 % SOLN INT 1 GTT INTO OU QPM  . prednisoLONE acetate (PRED FORTE) 1 % ophthalmic suspension SHAKE LIQUID AND INSTILL 1 DROP IN RIGHT EYE FOUR TIMES DAILY FOR 7 DAYS  . RESTASIS 0.05 % ophthalmic emulsion INT 1 GTT IN EACH EYE BID   No current facility-administered medications for this visit.  (Ophthalmic Drugs)   Current Outpatient Medications (Other)  Medication Sig  . allopurinol (ZYLOPRIM) 100 MG tablet Take 100 mg by mouth daily.  Marland Kitchen amLODipine (NORVASC) 2.5 MG tablet Take 2 tablets (5 mg total) by mouth daily.  Marland Kitchen atorvastatin (LIPITOR) 80 MG tablet Take 80 mg by mouth daily.  . carbamazepine (CARBATROL) 200 MG 12 hr capsule Take 200 mg by mouth daily.  .  ciprofloxacin (CIPRO) 500 MG tablet Take 1 tablet (500 mg total) by mouth 2 (two) times daily.  . cyclobenzaprine (FLEXERIL) 10 MG tablet Take 1 tablet (10 mg total) by mouth 2 (two) times daily as needed for muscle spasms.  . dapagliflozin propanediol (FARXIGA) 5 MG TABS tablet Take 5 mg by mouth daily.  Marland Kitchen lisinopril (PRINIVIL,ZESTRIL) 40 MG tablet Take 40 mg by mouth daily.  . methimazole (TAPAZOLE) 5 MG tablet TK 1 T PO BID  . metoprolol tartrate (LOPRESSOR) 25 MG tablet Take 25 mg by mouth 2 (two) times daily.  . metroNIDAZOLE (FLAGYL) 500 MG tablet Take 1 tablet (500 mg total) by mouth every 8 (eight) hours.  Letta Pate VERIO test strip U UTD  . rosuvastatin (CRESTOR) 10 MG tablet TK 1 T PO QD  . SitaGLIPtin-MetFORMIN HCl (JANUMET XR) 925 088 5515 MG TB24 Take 1 tablet by mouth daily with supper.  Marland Kitchen SYNJARDY 12.04-999 MG TABS TK 1 T PO BID  . traMADol (ULTRAM) 50 MG tablet Take 1 tablet (50 mg total) by mouth every 6 (six) hours as needed (mild pain).   No current facility-administered medications for this visit.  (Other)      REVIEW OF SYSTEMS: ROS    Positive for: Endocrine, Eyes   Negative for: Constitutional, Gastrointestinal, Neurological, Skin, Genitourinary, Musculoskeletal, HENT, Cardiovascular, Respiratory, Psychiatric, Allergic/Imm, Heme/Lymph   Last edited by  Celine Mans, COA on 12/02/2018  8:48 AM. (History)       ALLERGIES Allergies  Allergen Reactions  . Penicillins Itching    Has patient had a PCN reaction causing immediate rash, facial/tongue/throat swelling, SOB or lightheadedness with hypotension: No Has patient had a PCN reaction causing severe rash involving mucus membranes or skin necrosis: No Has patient had a PCN reaction that required hospitalization No Has patient had a PCN reaction occurring within the last 10 years: No If all of the above answers are "NO", then may proceed with Cephalosporin use.    PAST MEDICAL HISTORY Past Medical History:   Diagnosis Date  . Cataract   . Epilepsy (HCC)   . Gout   . History of blood transfusion    "when I got my hysterectomy" (07/22/2017)  . Hyperlipidemia   . Hypertension   . Seizures (HCC)    "from epilepsy; the kind where I space out; last one was ~ 1 yr ago" (07/22/2017)  . Type II diabetes mellitus (HCC)    Past Surgical History:  Procedure Laterality Date  . ABDOMINAL HYSTERECTOMY    . APPENDECTOMY    . CATARACT EXTRACTION    . CATARACT EXTRACTION W/ INTRAOCULAR LENS  IMPLANT, BILATERAL Bilateral 2016-2017   right-left  . CHOLECYSTECTOMY  03/2009   lap/notes 04/25/2011  . LAPAROSCOPIC APPENDECTOMY N/A 07/21/2017   Procedure: APPENDECTOMY LAPAROSCOPIC;  Surgeon: Manus Rudd, MD;  Location: [facility] OR;  Service: General;  Laterality: N/A;  . TUBAL LIGATION      FAMILY HISTORY Family History  Problem Relation Age of Onset  . Diabetes Mother   . Hypertension Mother   . Heart failure Mother   . Cataracts Mother   . Diabetes Father   . Coronary artery disease Father   . Retinitis pigmentosa Sister   . Amblyopia Neg Hx   . Blindness Neg Hx   . Glaucoma Neg Hx   . Macular degeneration Neg Hx   . Retinal detachment Neg Hx   . Strabismus Neg Hx     SOCIAL HISTORY Social History   Tobacco Use  . Smoking status: Former Smoker    Packs/day: 0.50    Years: 20.00    Pack years: 10.00    Types: Cigarettes    Last attempt to quit: 2009    Years since quitting: 10.9  . Smokeless tobacco: Never Used  Substance Use Topics  . Alcohol use: Yes    Comment: 07/22/2017 "glass of wine maybe once/month"  . Drug use: No         OPHTHALMIC EXAM:  Base Eye Exam    Visual Acuity (Snellen - Linear)      Right Left   Dist cc 20/25 +2 20/20 -   Dist ph cc NI    Correction:  Glasses       Tonometry (Tonopen, 8:49 AM)      Right Left   Pressure 18 18       Pupils      Dark Light Shape React APD   Right 3 2 Round Brisk None   Left 3 2 Round Brisk None       Visual Fields  (Counting fingers)      Left Right    Full Full       Extraocular Movement      Right Left    Full, Ortho Full, Ortho       Neuro/Psych    Oriented x3:  Yes   Mood/Affect:  Normal       Dilation    Both eyes:  1.0% Mydriacyl, 2.5% Phenylephrine @ 8:49 AM        Slit Lamp and Fundus Exam    Slit Lamp Exam      Right Left   Lids/Lashes Dermatochalasis - upper lid Dermatochalasis - upper lid   Conjunctiva/Sclera Melanosis Melanosis   Cornea 1+ Punctate epithelial erosions 1+ Punctate epithelial erosions 1+ EVND   Anterior Chamber deep and clear Narrow angle deep and clear Narrow angle   Iris Round and dilated Round and dilated   Lens Posterior chamber intraocular lens with trace PCO Posterior chamber intraocular lens in excellent position   Vitreous Vitreous syneresis Vitreous syneresis, vitreous condensation superiorly       Fundus Exam      Right Left   Disc Pink and Sharp Pink and Sharp   C/D Ratio 0.3 0.4   Macula Good foveal reflex, mild RPE mottling, scattered drusen, No heme or edema,  Good foveal reflex, mild RPE mottling, scattered drusen, No heme or edema   Vessels Copper wiring, Vascular attenuation, AV crossing changes Copper wiring, Vascular attenuation, AV crossing changes   Periphery Attached, mild lattice from 1100-1200 -- good laser surrounding Attached, superior lattice very peripheral from 1100-0100 and at 0400 -- good laser surrounding all lesions          IMAGING AND PROCEDURES  Imaging and Procedures for @TODAY @           ASSESSMENT/PLAN:    ICD-10-CM   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 Diabetes mellitus type 2 without retinopathy (HCC) E11.9   4. Essential hypertension I10   5. Hypertensive retinopathy of both eyes H35.033   6. Retinal edema H35.81   7. Pseudophakia of both eyes Z96.1     1,2. Lattice degeneration w/ atrophic holes, both eyes - patches of mild lattice with  small atrophic retinal holes at 1200 oclock OU; OS with small additional patch at 0400 - no other RT or RD on scleral depressed exam OU - discussed findings, prognosis, and treatment options including observation - S/P laser retinopexy OD (10.25.19) -- good laser in place - S/P laser retinopexy OS (11.15.19) -- good laser surrounding patches of lattice - f/u 3 mos for final check, then can release back to [facility]  3.Diabetes mellitus, type 2 without retinopathy - The incidence, risk factors for progression, natural history and treatment options for diabetic retinopathy  were discussed with patient.   - The need for close monitoring of blood glucose, blood pressure, and serum lipids, avoiding cigarette or any type of tobacco, and the need for long term follow up was also discussed with patient. - f/u in 1 year, sooner prn  4,5. Hypertensive retinopathy OU - discussed importance of tight BP control - monitor  6. No retinal edema on exam or OCT  7. Pseudophakia OU  - s/p CE/IOL OU (Bevis OD, C. Groat OS)  - beautiful surgeries, doing well  - monitor   Ophthalmic Meds Ordered this visit:  No orders of the defined types were placed in this encounter.      Return in about 3 months (around 03/03/2019) for Dilated Exam, OCT.  There are no Patient Instructions on file for this visit.   Explained the diagnoses, plan, and follow up with the patient and they expressed understanding.  Patient expressed understanding of the importance of proper follow up care.  This document serves as a record of services personally performed by Karie ChimeraBrian G. Estiben Mizuno, MD, PhD. It was created on their behalf by Laurian BrimAmanda Brown, OA, an ophthalmic assistant. The creation of this record is the provider's dictation and/or activities during the visit.    Electronically signed by: Laurian BrimAmanda Brown, OA  12.09.19 9:11 AM     Karie ChimeraBrian G. Cherylyn Sundby, M.D., Ph.D. Diseases & Surgery of the Retina and Vitreous [facility]  I have reviewed the above documentation for accuracy and completeness, and I agree with the above. Karie ChimeraBrian G. Mckay Tegtmeyer, M.D., Ph.D. 12/02/18 9:11 AM    Abbreviations: M myopia (nearsighted); A astigmatism; H hyperopia (farsighted); P presbyopia; Mrx spectacle prescription;  CTL contact lenses; OD right eye; OS left eye; OU both eyes  XT exotropia; ET esotropia; PEK punctate epithelial keratitis; PEE punctate epithelial erosions; DES dry eye syndrome; MGD meibomian gland dysfunction; ATs artificial tears; PFAT's preservative free artificial tears; NSC nuclear sclerotic cataract; PSC posterior subcapsular cataract; ERM epi-retinal membrane; PVD posterior vitreous detachment; RD retinal detachment; DM diabetes mellitus; DR diabetic retinopathy; NPDR non-proliferative diabetic retinopathy; PDR proliferative diabetic retinopathy; CSME clinically significant macular edema; DME diabetic macular edema; dbh dot blot hemorrhages; CWS cotton wool spot; POAG primary open angle glaucoma; C/D cup-to-disc ratio; HVF humphrey visual field; GVF goldmann visual field; OCT optical coherence tomography; IOP intraocular pressure; BRVO Branch retinal vein occlusion; CRVO central retinal vein occlusion; CRAO central retinal artery occlusion; BRAO branch retinal artery occlusion; RT retinal tear; SB scleral buckle; PPV pars plana vitrectomy; VH Vitreous hemorrhage; PRP panretinal laser photocoagulation; IVK intravitreal kenalog; VMT vitreomacular traction; MH Macular hole;  NVD neovascularization of the disc; NVE neovascularization elsewhere; AREDS age related eye disease study; ARMD age related macular degeneration; POAG primary open angle glaucoma; EBMD epithelial/anterior basement membrane dystrophy; ACIOL anterior chamber intraocular lens; IOL intraocular lens; PCIOL posterior chamber intraocular lens; Phaco/IOL phacoemulsification with intraocular lens placement; PRK photorefractive keratectomy; LASIK laser assisted in  situ keratomileusis; HTN hypertension; DM diabetes mellitus; COPD chronic obstructive pulmonary disease

## 2018-12-02 ENCOUNTER — Ambulatory Visit (INDEPENDENT_AMBULATORY_CARE_PROVIDER_SITE_OTHER): Payer: Medicare Other | Admitting: Ophthalmology

## 2018-12-02 ENCOUNTER — Encounter (INDEPENDENT_AMBULATORY_CARE_PROVIDER_SITE_OTHER): Payer: Self-pay | Admitting: Ophthalmology

## 2018-12-02 DIAGNOSIS — Z961 Presence of intraocular lens: Secondary | ICD-10-CM

## 2018-12-02 DIAGNOSIS — E119 Type 2 diabetes mellitus without complications: Secondary | ICD-10-CM

## 2018-12-02 DIAGNOSIS — H33323 Round hole, bilateral: Secondary | ICD-10-CM

## 2018-12-02 DIAGNOSIS — H35033 Hypertensive retinopathy, bilateral: Secondary | ICD-10-CM

## 2018-12-02 DIAGNOSIS — H3581 Retinal edema: Secondary | ICD-10-CM

## 2018-12-02 DIAGNOSIS — H35413 Lattice degeneration of retina, bilateral: Secondary | ICD-10-CM

## 2018-12-02 DIAGNOSIS — I10 Essential (primary) hypertension: Secondary | ICD-10-CM

## 2018-12-05 ENCOUNTER — Telehealth (INDEPENDENT_AMBULATORY_CARE_PROVIDER_SITE_OTHER): Payer: Self-pay

## 2019-02-11 ENCOUNTER — Encounter (INDEPENDENT_AMBULATORY_CARE_PROVIDER_SITE_OTHER): Payer: Medicare Other | Admitting: Ophthalmology

## 2019-02-22 IMAGING — CR DG HIP (WITH OR WITHOUT PELVIS) 2-3V*R*
2 series · 2 of 2 positions shown · non-contrast
Comparison: None.

CLINICAL DATA: Right groin pain for 1 day.

EXAM:
DG HIP (WITH OR WITHOUT PELVIS) 2-3V RIGHT

[w hip ap right]
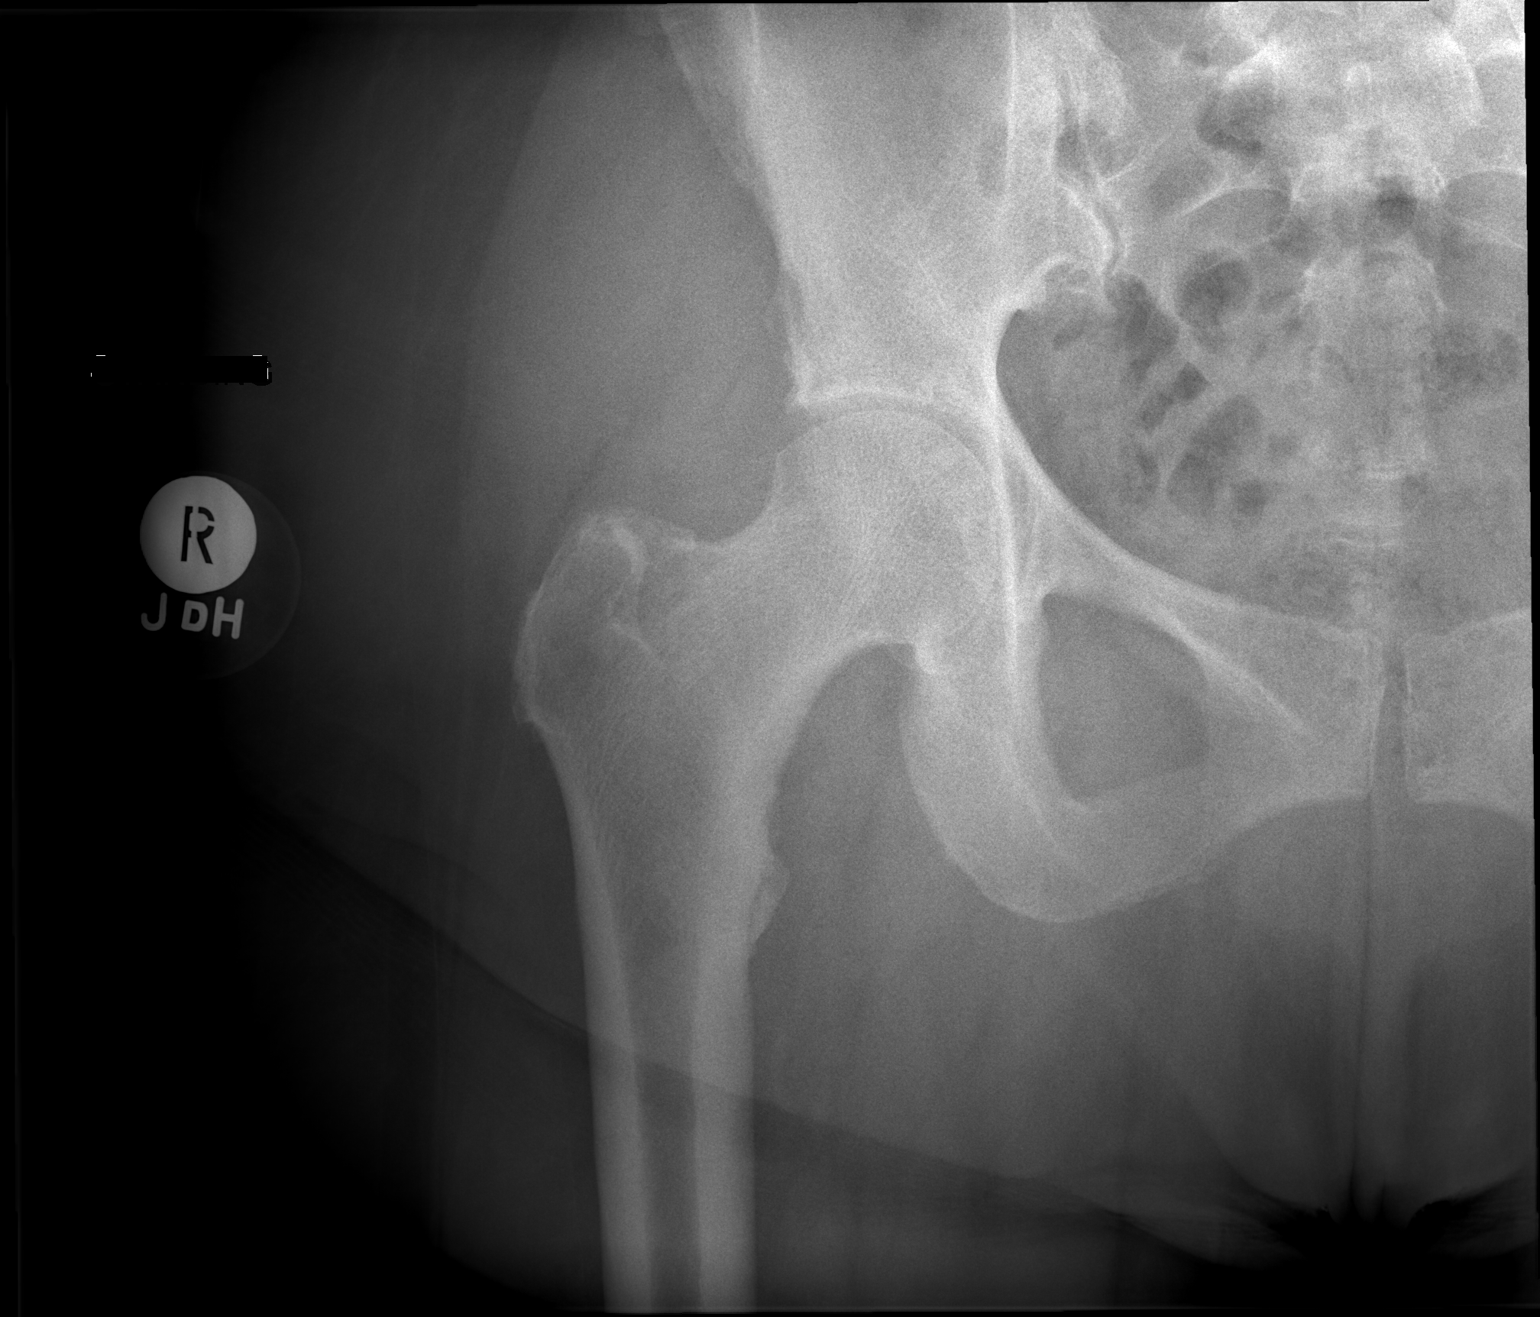

[w hip frog right]
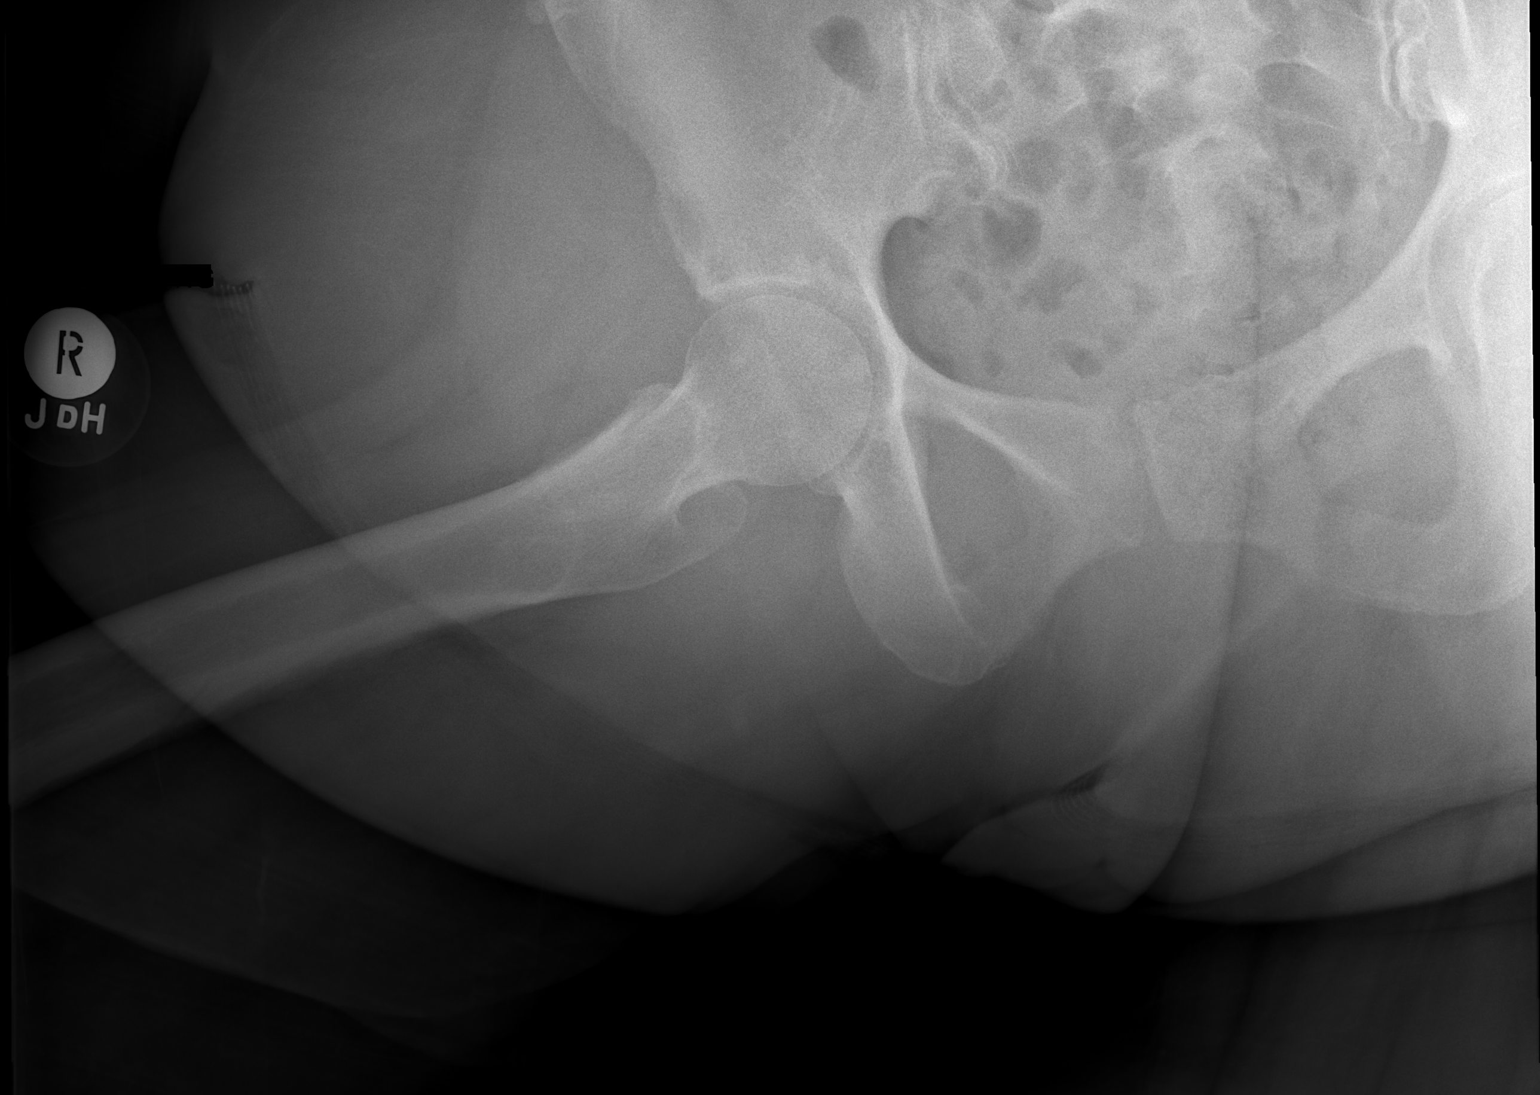

[2 of 2 positions shown; findings below may reference images not displayed]

FINDINGS: There is no evidence of hip fracture or dislocation. There is no
evidence of arthropathy or other focal bone abnormality.
IMPRESSION: No acute osseous injury of the right hip.

## 2019-03-03 ENCOUNTER — Encounter (INDEPENDENT_AMBULATORY_CARE_PROVIDER_SITE_OTHER): Payer: Medicare Other | Admitting: Ophthalmology

## 2019-03-17 ENCOUNTER — Encounter (INDEPENDENT_AMBULATORY_CARE_PROVIDER_SITE_OTHER): Payer: Medicare Other | Admitting: Ophthalmology

## 2019-03-24 ENCOUNTER — Ambulatory Visit: Payer: Medicare Other | Admitting: Podiatry

## 2019-05-01 ENCOUNTER — Ambulatory Visit: Payer: Medicare Other | Admitting: Podiatry

## 2019-05-01 ENCOUNTER — Encounter

## 2019-05-20 ENCOUNTER — Encounter (INDEPENDENT_AMBULATORY_CARE_PROVIDER_SITE_OTHER): Payer: Medicare Other | Admitting: Ophthalmology

## 2019-06-16 ENCOUNTER — Ambulatory Visit: Payer: Medicare Other | Admitting: Podiatry

## 2019-06-29 ENCOUNTER — Encounter: Payer: Self-pay | Admitting: Allergy

## 2019-06-29 ENCOUNTER — Ambulatory Visit (INDEPENDENT_AMBULATORY_CARE_PROVIDER_SITE_OTHER): Payer: Medicare Other | Admitting: Allergy

## 2019-06-29 ENCOUNTER — Other Ambulatory Visit: Payer: Self-pay

## 2019-06-29 VITALS — BP 158/100 | HR 72 | Temp 97.9°F | Resp 16 | Ht 62.0 in | Wt 197.2 lb

## 2019-06-29 DIAGNOSIS — L299 Pruritus, unspecified: Secondary | ICD-10-CM | POA: Diagnosis not present

## 2019-06-29 DIAGNOSIS — R21 Rash and other nonspecific skin eruption: Secondary | ICD-10-CM | POA: Diagnosis not present

## 2019-06-29 MED ORDER — TRIAMCINOLONE ACETONIDE 0.1 % EX CREA: 1.0000 "application " | TOPICAL_CREAM | Freq: Two times a day (BID) | CUTANEOUS | 1 refills | Status: DC

## 2019-06-29 MED ORDER — HYDROXYZINE HCL 10 MG PO TABS: ORAL_TABLET | ORAL | 1 refills | Status: DC

## 2019-06-29 MED ORDER — CETIRIZINE HCL 10 MG PO TABS: 10.0000 mg | ORAL_TABLET | Freq: Every day | ORAL | 3 refills | Status: DC

## 2019-06-29 NOTE — Progress Notes (Signed)
New Patient Note  RE: Dawn BanterLinda F Rice MRN: [id number] DOB: 09/28/1956 Date of Office Visit: 06/29/2019  Referring provider: Ralene OkMoreira, Roy, MD Primary care provider: Ralene OkMoreira, Roy, MD  Chief Complaint: Pruritus and Rash  History of Present Illness: I had the pleasure of seeing Dawn FredericksonLinda Rice for initial evaluation at the [facility] on 06/29/2019. She is a 63 y.o. female, who is referred here by Ralene OkMoreira, Roy, MD for the evaluation of pruritus.   Rash/pruritus started about 3-4 months ago. Mainly occurs on her legs and arms. Describes them as pruritic, bumpy, flesh colored. Individual rashes lasts about a few days and never completely resolve.  Usually has a flare every other 2-3 days.  Patient lives alone and no one else around her has this type of rash.   Associated symptoms include: none. Suspected triggers are unknown. Denies any fevers, chills, changes in medications, foods, personal care products or recent infections. She has tried the following therapies: Claritin, benadryl, hydrocortisone cream and Lotrisone cream with minimal benefit. Systemic steroids no. Currently on benadryl prn at night.  Previous work up includes: CBC diff, CMP, TSH - anemic. Previous history of rash/hives: history of eczema in the past as a child.  Patient is up to date with the following cancer screening tests: mammogram is due, colonoscopy, pap smears.  Currently using Aveeno daily, Tide or Gain.   Assessment and Plan: Dawn QuinLinda is a 63 y.o. female with: Pruritus Breaking out in pruritic rash for the past 3-4 months mainly on her upper and lower extremities. No triggers noted. Usually flares every 2-3 days. Tried OTC antihistamines, hydrocortisone cream and Lotrisone cream with minimal benefit. June 2020 bloodwork - CBC diff showed anemia, CMP and TSH unremarkable. Diabetes and HTN not at optimal control.   Unable to skin test today due to recent antihistamine intake.  Discussed that  the number one cause for pruritus is xerosis.   Start zyrtec 10mg  daily for the itching.   Start hydroxyzine 10-30mg  1 hour before bedtime as needed for itching.  May use triamcinolone cream 0.1% twice a day for rash flares as needed up to 3 weeks at a time. Do not use on the face or neck.  Take pictures when the rash flares.  Discussed proper skin care.   Follow up next week for skin testing - must stop all allergy medications for 3 days before including the zyrtec and hydroxyzine. Will make additional recommendations based on results.   Rash and other nonspecific skin eruption  See assessment and plan as above for pruritus.  Return for Skin testing.  Meds ordered this encounter  Medications  . cetirizine (ZYRTEC ALLERGY) 10 MG tablet    Sig: Take 1 tablet (10 mg total) by mouth daily.    Dispense:  30 tablet    Refill:  3  . hydrOXYzine (ATARAX/VISTARIL) 10 MG tablet    Sig: Take 10-30mg  1 hour before bedtime as needed for itching    Dispense:  90 tablet    Refill:  1  . triamcinolone cream (KENALOG) 0.1 %    Sig: Apply 1 application topically 2 (two) times daily. As needed for up to 3 weeks at a time. Do not use on the face or neck.    Dispense:  80 g    Refill:  1   Other allergy screening: Asthma: no Rhino conjunctivitis: no Food allergy: no Medication allergy: yes  Penicillin - pruritus Hymenoptera allergy: no Urticaria: no Eczema:yes History of recurrent infections  suggestive of immunodeficency: no  Diagnostics: Skin Testing: Deferred due to recent antihistamines use.  Past Medical History: Patient Active Problem List   Diagnosis Date Noted  . Pruritus 06/29/2019  . Rash and other nonspecific skin eruption 06/29/2019  . Acute appendicitis 07/21/2017  . Essential hypertension, benign   . Chest pain 01/19/2017  . Hypertension 01/19/2017  . Hyperlipidemia 01/19/2017  . Diabetes (HCC) 01/19/2017  . Seizures (HCC) 01/19/2017  . Gout    Past Medical  History:  Diagnosis Date  . Cataract   . Epilepsy (HCC)   . Gout   . History of blood transfusion    "when I got my hysterectomy" (07/22/2017)  . Hyperlipidemia   . Hypertension   . Seizures (HCC)    "from epilepsy; the kind where I space out; last one was ~ 1 yr ago" (07/22/2017)  . Type II diabetes mellitus (HCC)    Past Surgical History: Past Surgical History:  Procedure Laterality Date  . ABDOMINAL HYSTERECTOMY    . APPENDECTOMY    . CATARACT EXTRACTION    . CATARACT EXTRACTION W/ INTRAOCULAR LENS  IMPLANT, BILATERAL Bilateral 2016-2017   right-left  . CHOLECYSTECTOMY  03/2009   lap/notes 04/25/2011  . LAPAROSCOPIC APPENDECTOMY N/A 07/21/2017   Procedure: APPENDECTOMY LAPAROSCOPIC;  Surgeon: Manus Ruddsuei, Matthew, MD;  Location: [facility] OR;  Service: General;  Laterality: N/A;  . TUBAL LIGATION     Medication List:  Current Outpatient Medications  Medication Sig Dispense Refill  . allopurinol (ZYLOPRIM) 100 MG tablet Take 100 mg by mouth daily.    Marland Kitchen. amLODipine (NORVASC) 2.5 MG tablet Take 2 tablets (5 mg total) by mouth daily. 30 tablet 0  . atorvastatin (LIPITOR) 80 MG tablet Take 80 mg by mouth daily.  2  . carbamazepine (CARBATROL) 200 MG 12 hr capsule Take 200 mg by mouth daily.  3  . cyclobenzaprine (FLEXERIL) 10 MG tablet Take 1 tablet (10 mg total) by mouth 2 (two) times daily as needed for muscle spasms. 20 tablet 0  . dapagliflozin propanediol (FARXIGA) 5 MG TABS tablet Take 5 mg by mouth daily.    Marland Kitchen. lisinopril (PRINIVIL,ZESTRIL) 40 MG tablet Take 40 mg by mouth daily.  3  . methimazole (TAPAZOLE) 5 MG tablet TK 1 T PO BID  0  . metoprolol tartrate (LOPRESSOR) 25 MG tablet Take 25 mg by mouth 2 (two) times daily.    . metroNIDAZOLE (FLAGYL) 500 MG tablet Take 1 tablet (500 mg total) by mouth every 8 (eight) hours. 15 tablet 0  . ONETOUCH VERIO test strip U UTD  2  . rosuvastatin (CRESTOR) 10 MG tablet TK 1 T PO QD  3  . SYNJARDY 12.04-999 MG TABS TK 1 T PO BID  1  . traMADol  (ULTRAM) 50 MG tablet Take 1 tablet (50 mg total) by mouth every 6 (six) hours as needed (mild pain). 20 tablet 0  . cetirizine (ZYRTEC ALLERGY) 10 MG tablet Take 1 tablet (10 mg total) by mouth daily. 30 tablet 3  . hydrOXYzine (ATARAX/VISTARIL) 10 MG tablet Take 10-30mg  1 hour before bedtime as needed for itching 90 tablet 1  . triamcinolone cream (KENALOG) 0.1 % Apply 1 application topically 2 (two) times daily. As needed for up to 3 weeks at a time. Do not use on the face or neck. 80 g 1   No current facility-administered medications for this visit.    Allergies: Allergies  Allergen Reactions  . Penicillins Itching    Has patient had a  PCN reaction causing immediate rash, facial/tongue/throat swelling, SOB or lightheadedness with hypotension: No Has patient had a PCN reaction causing severe rash involving mucus membranes or skin necrosis: No Has patient had a PCN reaction that required hospitalization No Has patient had a PCN reaction occurring within the last 10 years: No If all of the above answers are "NO", then may proceed with Cephalosporin use.   Social History: Social History   Socioeconomic History  . Marital status: Widowed    Spouse name: Not on file  . Number of children: Not on file  . Years of education: Not on file  . Highest education level: Not on file  Occupational History  . Not on file  Social Needs  . Financial resource strain: Not on file  . Food insecurity    Worry: Not on file    Inability: Not on file  . Transportation needs    Medical: Not on file    Non-medical: Not on file  Tobacco Use  . Smoking status: Former Smoker    Packs/day: 0.50    Years: 20.00    Pack years: 10.00    Types: Cigarettes    Quit date: 2009    Years since quitting: 11.5  . Smokeless tobacco: Never Used  Substance and Sexual Activity  . Alcohol use: Yes    Comment: 07/22/2017 "glass of wine maybe once/month"  . Drug use: No  . Sexual activity: Not on file  Lifestyle   . Physical activity    Days per week: Not on file    Minutes per session: Not on file  . Stress: Not on file  Relationships  . Social Musicianconnections    Talks on phone: Not on file    Gets together: Not on file    Attends religious service: Not on file    Active member of club or organization: Not on file    Attends meetings of clubs or organizations: Not on file    Relationship status: Not on file  Other Topics Concern  . Not on file  Social History Narrative  . Not on file   Lives in a house. Smoking: quit in 2009, less than 1 pack per day since 1982 Occupation: on disability  Environmental History: Water Damage/mildew in the house: no Carpet in the family room: no Carpet in the bedroom: yes Heating: gas Cooling: central Pet: no  Family History: Family History  Problem Relation Age of Onset  . Diabetes Mother   . Hypertension Mother   . Heart failure Mother   . Cataracts Mother   . Diabetes Father   . Coronary artery disease Father   . Retinitis pigmentosa Sister   . Amblyopia Neg Hx   . Blindness Neg Hx   . Glaucoma Neg Hx   . Macular degeneration Neg Hx   . Retinal detachment Neg Hx   . Strabismus Neg Hx    Problem                               Relation Asthma                                   No  Eczema  Mother  Food allergy                          Son Allergic rhino conjunctivitis     Son  Review of Systems  Constitutional: Negative for appetite change, chills, fever and unexpected weight change.  HENT: Negative for congestion and rhinorrhea.   Eyes: Negative for itching.  Respiratory: Negative for cough, chest tightness, shortness of breath and wheezing.   Cardiovascular: Negative for chest pain.  Gastrointestinal: Negative for abdominal pain.  Genitourinary: Negative for difficulty urinating.  Skin: Positive for rash.  Allergic/Immunologic: Negative for environmental allergies and food allergies.  Neurological: Negative  for headaches.   Objective: BP (!) 158/100   Pulse 72   Temp 97.9 F (36.6 C) (Temporal)   Resp 16   Ht 5\' 2"  (1.575 m)   Wt 197 lb 3.2 oz (89.4 kg)   SpO2 98%   BMI 36.07 kg/m  Body mass index is 36.07 kg/m. Physical Exam  Constitutional: She is oriented to person, place, and time. She appears well-developed and well-nourished.  HENT:  Head: Normocephalic and atraumatic.  Right Ear: External ear normal.  Left Ear: External ear normal.  Nose: Nose normal.  Mouth/Throat: Oropharynx is clear and moist.  Eyes: Conjunctivae and EOM are normal.  Neck: Neck supple.  Cardiovascular: Normal rate, regular rhythm and normal heart sounds. Exam reveals no gallop and no friction rub.  No murmur heard. Pulmonary/Chest: Effort normal and breath sounds normal. She has no wheezes. She has no rales.  Abdominal: Soft.  Neurological: She is alert and oriented to person, place, and time.  Skin: Skin is warm and dry. Rash noted.  Areas of papular hyperpigmentation on anterior shin area b/l.   Psychiatric: She has a normal mood and affect. Her behavior is normal.  Nursing note and vitals reviewed.  The plan was reviewed with the patient/family, and all questions/concerned were addressed.  It was my pleasure to see Leanne today and participate in her care. Please feel free to contact me with any questions or concerns.  Sincerely,  Rexene Alberts, DO Allergy & Immunology  [facility] office: [phone] [facility] office: [phone]

## 2019-06-29 NOTE — Patient Instructions (Addendum)
Start zyrtec 10mg  daily. Start hydroxyzine 10-30mg  1 hour before bedtime as needed for itching. May use triamcinolone cream 0.1% twice a day for rash flares as needed up to 3 weeks at a time. Do not use on the face or neck. Take pictures when the rash flares.  Start skin care recommendations as below.  Follow up next week for skin testing - must stop all allergy medications for 3 days before including the zyrtec and hydroxyzine.    Skin care recommendations  Bath time: . Always use lukewarm water. AVOID very hot or cold water. Marland Kitchen Keep bathing time to 5-10 minutes. . Do NOT use bubble bath. . Use a mild soap and use just enough to wash the dirty areas. . Do NOT scrub skin vigorously.  . After bathing, pat dry your skin with a towel. Do NOT rub or scrub the skin.  Moisturizers and prescriptions:  . ALWAYS apply moisturizers immediately after bathing (within 3 minutes). This helps to lock-in moisture. . Use the moisturizer several times a day over the whole body. Kermit Balo summer moisturizers include: Aveeno, CeraVe, Cetaphil. Kermit Balo winter moisturizers include: Aquaphor, Vaseline, Cerave, Cetaphil, Eucerin, Vanicream. . When using moisturizers along with medications, the moisturizer should be applied about one hour after applying the medication to prevent diluting effect of the medication or moisturize around where you applied the medications. When not using medications, the moisturizer can be continued twice daily as maintenance.  Laundry and clothing: . Avoid laundry products with added color or perfumes. . Use unscented hypo-allergenic laundry products such as Tide free, Cheer free & gentle, and All free and clear.  . If the skin still seems dry or sensitive, you can try double-rinsing the clothes. . Avoid tight or scratchy clothing such as wool. . Do not use fabric softeners or dyer sheets.

## 2019-06-29 NOTE — Assessment & Plan Note (Addendum)
Breaking out in pruritic rash for the past 3-4 months mainly on her upper and lower extremities. No triggers noted. Usually flares every 2-3 days. Tried OTC antihistamines, hydrocortisone cream and Lotrisone cream with minimal benefit. June 2020 bloodwork - CBC diff showed anemia, CMP and TSH unremarkable. Diabetes and HTN not at optimal control.   Unable to skin test today due to recent antihistamine intake.  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   Start zyrtec 10mg  daily for the itching.   Start hydroxyzine 10-30mg  1 hour before bedtime as needed for itching.  May use triamcinolone cream 0.1% twice a day for rash flares as needed up to 3 weeks at a time. Do not use on the face or neck.  Take pictures when the rash flares.  Discussed proper skin care.   Follow up next week for skin testing - must stop all allergy medications for 3 days before including the zyrtec and hydroxyzine. Will make additional recommendations based on results.

## 2019-06-29 NOTE — Assessment & Plan Note (Signed)
   See assessment and plan as above for pruritus.  

## 2019-07-08 ENCOUNTER — Encounter: Payer: Self-pay | Admitting: Allergy

## 2019-07-08 ENCOUNTER — Other Ambulatory Visit: Payer: Self-pay

## 2019-07-08 ENCOUNTER — Ambulatory Visit (INDEPENDENT_AMBULATORY_CARE_PROVIDER_SITE_OTHER): Payer: Medicare Other | Admitting: Allergy

## 2019-07-08 VITALS — BP 150/78 | HR 88 | Temp 98.4°F | Resp 16

## 2019-07-08 DIAGNOSIS — Z9109 Other allergy status, other than to drugs and biological substances: Secondary | ICD-10-CM

## 2019-07-08 DIAGNOSIS — L299 Pruritus, unspecified: Secondary | ICD-10-CM | POA: Diagnosis not present

## 2019-07-08 NOTE — Assessment & Plan Note (Addendum)
Past history - Breaking out in pruritic rash for the past 3-4 months mainly on her upper and lower extremities. No triggers noted. Usually flares every 2-3 days. Tried OTC antihistamines, hydrocortisone cream and Lotrisone cream with minimal benefit. June 2020 bloodwork - CBC diff showed anemia, CMP and TSH unremarkable. Diabetes and HTN not at optimal control.  Interim history - Some benefit with zyrtec and hydroxyzine.  Today's skin testing showed: Positive to dust mites.  Start environmental control measures.  Discussed with patient that dust mite allergy may be contributing to her rash/pruritus but xerosis is still most likely the main contributor for her pruritis.  Start zyrtec 10mg  daily for the itching.   Start hydroxyzine 10-30mg  1 hour before bedtime as needed for itching.  May use triamcinolone cream 0.1% twice a day for rash flares as needed up to 3 weeks at a time. Do not use on the face or neck.  Take pictures when the rash flares.  Continue proper skin care.   Try Aquaphor or Vaniply ointment as a moisturizer.

## 2019-07-08 NOTE — Progress Notes (Signed)
Follow Up Note  RE: Dawn BanterLinda F Rice MRN: [id number] DOB: 10/05/1956 Date of Office Visit: 07/08/2019  Referring provider: Ralene OkMoreira, Roy, MD Primary care provider: Ralene OkMoreira, Roy, MD  Chief Complaint: Allergy Testing  History of Present Illness: I had the pleasure of seeing Dawn FredericksonLinda Rice for a follow up visit at the [facility] on 07/08/2019. She is a 63 y.o. female, who is being followed for pruritus and rash. Today she is here for skin testing. Her previous allergy office visit was on 06/29/2019 with Dr. Selena BattenKim.   Pruritus/rash Tried the zyrtec and hydroxyzine for a few days which is helping the itching. Liked the Vaniply ointment.   Assessment and Plan: Dawn QuinLinda is a 63 y.o. female with: Pruritus Past history - Breaking out in pruritic rash for the past 3-4 months mainly on her upper and lower extremities. No triggers noted. Usually flares every 2-3 days. Tried OTC antihistamines, hydrocortisone cream and Lotrisone cream with minimal benefit. June 2020 bloodwork - CBC diff showed anemia, CMP and TSH unremarkable. Diabetes and HTN not at optimal control.  Interim history - Some benefit with zyrtec and hydroxyzine.  Today's skin testing showed: Positive to dust mites.  Start environmental control measures.  Discussed with patient that dust mite allergy may be contributing to her rash/pruritus but xerosis is still most likely the main contributor for her pruritis.  Start zyrtec 10mg  daily for the itching.   Start hydroxyzine 10-30mg  1 hour before bedtime as needed for itching.  May use triamcinolone cream 0.1% twice a day for rash flares as needed up to 3 weeks at a time. Do not use on the face or neck.  Take pictures when the rash flares.  Continue proper skin care.   Try Aquaphor or Vaniply ointment as a moisturizer.  House dust mite allergy Denies rhino conjunctivitis symptoms.  Today's skin testing positive to dust mite.   Start environmental control  measures.   Return in about 2 months (around 09/08/2019).  Diagnostics: Skin Testing: Environmental allergy panel and select foods. Positive test to: dust mite only.  Results discussed with patient/family. Airborne Adult Perc - 07/08/19 1357    Time Antigen Placed  1357    Allergen Manufacturer  Waynette ButteryGreer    Location  Back    Number of Test  59    Panel 1  Select    1. Control-Buffer 50% Glycerol  Negative    2. Control-Histamine 1 mg/ml  2+    3. Albumin saline  Negative    4. Bahia  Negative    5. [place]  Negative    6. Johnson  Negative    7. Kentucky Blue  Negative    8. Meadow Fescue  Negative    9. Perennial Rye  Negative    10. Sweet Vernal  Negative    11. Timothy  Negative    12. Cocklebur  Negative    13. Burweed Marshelder  Negative    14. Ragweed, short  Negative    15. Ragweed, Giant  Negative    16. Plantain,  English  Negative    17. Lamb's Quarters  Negative    18. Sheep Sorrell  Negative    19. Rough Pigweed  Negative    20. Marsh Elder, Rough  Negative    21. Mugwort, Common  Negative    22. Ash mix  Negative    23. Birch mix  Negative    24. Beech American  Negative    25.  Box, Elder  Negative    26. Cedar, red  Negative    27. Cottonwood, Guinea[place]Biss[place]astern  Negative    28. Elm mix  Negative    29. Hickory mix  Negative    30. Maple mix  Negative    31. Oak, Guinea[place]Biss[place]astern mix  Negative    32. Pecan Pollen  Negative    33. Pine mix  Negative    34. Sycamore Eastern  Negative    35. Walnut, Black Pollen  Negative    36. Alternaria alternata  Negative    37. Cladosporium Herbarum  Negative    38. Aspergillus mix  Negative    39. Penicillium mix  Negative    40. Bipolaris sorokiniana (Helminthosporium)  Negative    41. Drechslera spicifera (Curvularia)  Negative    42. Mucor plumbeus  Negative    43. Fusarium moniliforme  Negative    44. Aureobasidium pullulans (pullulara)  Negative    45. Rhizopus oryzae  Negative    46. Botrytis cinera  Negative    47.  Epicoccum nigrum  Negative    48. Phoma betae  Negative    49. Candida Albicans  Negative    50. Trichophyton mentagrophytes  Negative    51. Mite, D Farinae  5,000 AU/ml  4+    52. Mite, D Pteronyssinus  5,000 AU/ml  4+    53. Cat Hair 10,000 BAU/ml  Negative    54.  Dog Epithelia  Negative    55. Mixed Feathers  Negative    56. Horse Epithelia  Negative    57. Cockroach, German  Negative    58. Mouse  Negative    59. Tobacco Leaf  Negative     Food Perc - 07/08/19 1358    Time Antigen Placed  1358    Allergen Manufacturer  Waynette ButteryGreer    Location  Back    Number of allergen test  10    Food  Select       Medication List:  Current Outpatient Medications  Medication Sig Dispense Refill  . allopurinol (ZYLOPRIM) 100 MG tablet Take 100 mg by mouth daily.    Marland Kitchen. amLODipine (NORVASC) 2.5 MG tablet Take 2 tablets (5 mg total) by mouth daily. 30 tablet 0  . atorvastatin (LIPITOR) 80 MG tablet Take 80 mg by mouth daily.  2  . carbamazepine (CARBATROL) 200 MG 12 hr capsule Take 200 mg by mouth daily.  3  . cetirizine (ZYRTEC ALLERGY) 10 MG tablet Take 1 tablet (10 mg total) by mouth daily. 30 tablet 3  . cyclobenzaprine (FLEXERIL) 10 MG tablet Take 1 tablet (10 mg total) by mouth 2 (two) times daily as needed for muscle spasms. 20 tablet 0  . dapagliflozin propanediol (FARXIGA) 5 MG TABS tablet Take 5 mg by mouth daily.    . hydrOXYzine (ATARAX/VISTARIL) 10 MG tablet Take 10-30mg  1 hour before bedtime as needed for itching 90 tablet 1  . lisinopril (PRINIVIL,ZESTRIL) 40 MG tablet Take 40 mg by mouth daily.  3  . methimazole (TAPAZOLE) 5 MG tablet TK 1 T PO BID  0  . metoprolol tartrate (LOPRESSOR) 25 MG tablet Take 25 mg by mouth 2 (two) times daily.    . metroNIDAZOLE (FLAGYL) 500 MG tablet Take 1 tablet (500 mg total) by mouth every 8 (eight) hours. 15 tablet 0  . ONETOUCH VERIO test strip U UTD  2  . rosuvastatin (CRESTOR) 10 MG tablet TK 1 T PO QD  3  .  SYNJARDY 12.04-999 MG TABS TK 1 T  PO BID  1  . traMADol (ULTRAM) 50 MG tablet Take 1 tablet (50 mg total) by mouth every 6 (six) hours as needed (mild pain). 20 tablet 0  . triamcinolone cream (KENALOG) 0.1 % Apply 1 application topically 2 (two) times daily. As needed for up to 3 weeks at a time. Do not use on the face or neck. 80 g 1   No current facility-administered medications for this visit.    Allergies: Allergies  Allergen Reactions  . Penicillins Itching    Has patient had a PCN reaction causing immediate rash, facial/tongue/throat swelling, SOB or lightheadedness with hypotension: No Has patient had a PCN reaction causing severe rash involving mucus membranes or skin necrosis: No Has patient had a PCN reaction that required hospitalization No Has patient had a PCN reaction occurring within the last 10 years: No If all of the above answers are "NO", then may proceed with Cephalosporin use.   I reviewed her past medical history, social history, family history, and environmental history and no significant changes have been reported from previous visit on 06/29/2019.  Review of Systems  Constitutional: Negative for appetite change, chills, fever and unexpected weight change.  HENT: Negative for congestion and rhinorrhea.   Eyes: Negative for itching.  Respiratory: Negative for cough, chest tightness, shortness of breath and wheezing.   Cardiovascular: Negative for chest pain.  Gastrointestinal: Negative for abdominal pain.  Genitourinary: Negative for difficulty urinating.  Skin: Positive for rash.  Allergic/Immunologic: Positive for environmental allergies.  Neurological: Negative for headaches.   Objective: BP (!) 150/78   Pulse 88   Temp 98.4 F (36.9 C)   Resp 16   SpO2 98%  There is no height or weight on file to calculate BMI. Physical Exam  Constitutional: She is oriented to person, place, and time. She appears well-developed and well-nourished.  HENT:  Head: Normocephalic and atraumatic.  Right  Ear: External ear normal.  Left Ear: External ear normal.  Nose: Nose normal.  Mouth/Throat: Oropharynx is clear and moist.  Eyes: Conjunctivae and EOM are normal.  Neck: Neck supple.  Cardiovascular: Normal rate, regular rhythm and normal heart sounds. Exam reveals no gallop and no friction rub.  No murmur heard. Pulmonary/Chest: Effort normal and breath sounds normal. She has no wheezes. She has no rales.  Abdominal: Soft.  Neurological: She is alert and oriented to person, place, and time.  Skin: Skin is warm and dry. Rash noted.  Areas of papular hyperpigmentation on anterior shin area b/l.   Psychiatric: She has a normal mood and affect. Her behavior is normal.  Nursing note and vitals reviewed.  Previous notes and tests were reviewed. The plan was reviewed with the patient/family, and all questions/concerned were addressed.  It was my pleasure to see Kyarra today and participate in her care. Please feel free to contact me with any questions or concerns.  Sincerely,  Rexene Alberts, DO Allergy & Immunology  [facility] office: [phone] [facility] office: [place] office: [phone]

## 2019-07-08 NOTE — Assessment & Plan Note (Signed)
Denies rhino conjunctivitis symptoms.  Today's skin testing positive to dust mite.   Start environmental control measures.

## 2019-07-08 NOTE — Patient Instructions (Addendum)
Today's skin testing showed: Positive to dust mites. Start environmental control measures.   Start zyrtec 10mg  daily for the itching.   Start hydroxyzine 10-30mg  1 hour before bedtime as needed for itching.  May use triamcinolone cream 0.1% twice a day for rash flares as needed up to 3 weeks at a time. Do not use on the face or neck.  Take pictures when the rash flares.  Continue proper skin care.   Try Aquaphor or Vaniply ointment as a moisturizer.   Follow up in 2 months Skin care recommendations  Bath time: . Always use lukewarm water. AVOID very hot or cold water. Marland Kitchen Keep bathing time to 5-10 minutes. . Do NOT use bubble bath. . Use a mild soap and use just enough to wash the dirty areas. . Do NOT scrub skin vigorously.  . After bathing, pat dry your skin with a towel. Do NOT rub or scrub the skin.  Moisturizers and prescriptions:  . ALWAYS apply moisturizers immediately after bathing (within 3 minutes). This helps to lock-in moisture. . Use the moisturizer several times a day over the whole body. Kermit Balo summer moisturizers include: Aveeno, CeraVe, Cetaphil. Kermit Balo winter moisturizers include: Aquaphor, Vaseline, Cerave, Cetaphil, Eucerin, Vanicream. . When using moisturizers along with medications, the moisturizer should be applied about one hour after applying the medication to prevent diluting effect of the medication or moisturize around where you applied the medications. When not using medications, the moisturizer can be continued twice daily as maintenance.  Laundry and clothing: . Avoid laundry products with added color or perfumes. . Use unscented hypo-allergenic laundry products such as Tide free, Cheer free & gentle, and All free and clear.  . If the skin still seems dry or sensitive, you can try double-rinsing the clothes. . Avoid tight or scratchy clothing such as wool. . Do not use fabric softeners or dyer sheets.   Control of House Dust Mite  Allergen . Dust mite allergens are a common trigger of allergy and asthma symptoms. While they can be found throughout the house, these microscopic creatures thrive in warm, humid environments such as bedding, upholstered furniture and carpeting. . Because so much time is spent in the bedroom, it is essential to reduce mite levels there.  . Encase pillows, mattresses, and box springs in special allergen-proof fabric covers or airtight, zippered plastic covers.  . Bedding should be washed weekly in hot water (130 F) and dried in a hot dryer. Allergen-proof covers are available for comforters and pillows that can't be regularly washed.  Wendee Copp the allergy-proof covers every few months. Minimize clutter in the bedroom. Keep pets out of the bedroom.  Marland Kitchen Keep humidity less than 50% by using a dehumidifier or air conditioning. You can buy a humidity measuring device called a hygrometer to monitor this.  . If possible, replace carpets with hardwood, linoleum, or washable area rugs. If that's not possible, vacuum frequently with a vacuum that has a HEPA filter. . Remove all upholstered furniture and non-washable window drapes from the bedroom. . Remove all non-washable stuffed toys from the bedroom.  Wash stuffed toys weekly.

## 2019-07-15 ENCOUNTER — Other Ambulatory Visit: Payer: Self-pay

## 2019-07-15 ENCOUNTER — Encounter: Payer: Self-pay | Admitting: Podiatry

## 2019-07-15 ENCOUNTER — Ambulatory Visit (INDEPENDENT_AMBULATORY_CARE_PROVIDER_SITE_OTHER): Payer: Medicare Other | Admitting: Podiatry

## 2019-07-15 DIAGNOSIS — B351 Tinea unguium: Secondary | ICD-10-CM | POA: Diagnosis not present

## 2019-07-15 DIAGNOSIS — M79674 Pain in right toe(s): Secondary | ICD-10-CM | POA: Diagnosis not present

## 2019-07-15 DIAGNOSIS — M79675 Pain in left toe(s): Secondary | ICD-10-CM | POA: Diagnosis not present

## 2019-07-15 DIAGNOSIS — E119 Type 2 diabetes mellitus without complications: Secondary | ICD-10-CM | POA: Diagnosis not present

## 2019-07-15 NOTE — Progress Notes (Signed)
This patient presents to the office with chief complaint of long thick nails and diabetic feet.  This patient  says there  is  no pain and discomfort in their feet.  This patient says there are long thick painful nails.  These nails are painful walking and wearing shoes.  Patient has no history of infection or drainage from both feet.  Patient is unable to  self treat his own nails . This patient presents  to the office today for treatment of the  long nails and a foot evaluation due to history of  diabetes.  General Appearance  Alert, conversant and in no acute stress.  Vascular  Dorsalis pedis and posterior tibial  pulses are palpable  bilaterally.  Capillary return is within normal limits  bilaterally. Temperature is within normal limits  bilaterally.  Neurologic  Senn-Weinstein monofilament wire test within normal limits  bilaterally. Muscle power within normal limits bilaterally.  Nails Thick disfigured discolored nails with subungual debris  from hallux to fifth toes bilaterally. No evidence of bacterial infection or drainage bilaterally.  Orthopedic  No limitations of motion of motion feet .  No crepitus or effusions noted.  No bony pathology or digital deformities noted.  Skin  normotropic skin with no porokeratosis noted bilaterally.  No signs of infections or ulcers noted.     Onychomycosis  Diabetes with no foot complications  IE  Debride nails x 10.  A diabetic foot exam was performed and there is no evidence of any vascular or neurologic pathology.   RTC 3 months.   Areona Homer DPM  

## 2019-08-13 NOTE — Progress Notes (Signed)
[facility] - Clinic Note  08/17/2019     CHIEF COMPLAINT Patient presents for Retina Follow Up   HISTORY OF PRESENT ILLNESS: Dawn Rice is a 63 y.o. female who presents to the clinic today for:   HPI    Retina Follow Up    In both eyes.  This started 9 months ago.  Since onset it is stable.  I, the attending physician,  performed the HPI with the patient and updated documentation appropriately.          Comments    F/U Lattice Rice/holes OU. Patient states her vision is occasionally blurry, denies hyper/hypoglucemic issues, denies floaters,flashes and ocular pain. BS 99 this am, A1C 7. (3 mos ago), labs to be done soon per patient. Pt is using Systane and Allergy gtt's PRN        Last edited by Dawn ChrisZamora, Brian, MD on 08/17/2019  2:19 PM. (History)    pt states she is doing well, no change in vision or overall health  Referring physician: Francene BoyersBernstorf, Dawn W, MD [address] [place] [id number]  HISTORICAL INFORMATION:   Selected notes from the MEDICAL RECORD NUMBER Referred by Dr. Wynona LunaSteven Rice for concern of retinal hole and lattice degeneration LEE: 10.10.19 (S. Rice) [BCVA: OD: 20/20 OS: 20/20] Ocular Hx-DES, pseudo OU, ocular HTN,  PMH-DM (A1C: 7.0, takes Janumet)    CURRENT MEDICATIONS: No current outpatient medications on file. (Ophthalmic Drugs)   No current facility-administered medications for this visit.  (Ophthalmic Drugs)   Current Outpatient Medications (Other)  Medication Sig  . allopurinol (ZYLOPRIM) 100 MG tablet Take 100 mg by mouth daily.  Marland Kitchen. amLODipine (NORVASC) 2.5 MG tablet Take 2 tablets (5 mg total) by mouth daily.  Marland Kitchen. atorvastatin (LIPITOR) 80 MG tablet Take 80 mg by mouth daily.  . Biotin 5000 MCG CAPS Take by mouth 2 (two) times a day.  . carbamazepine (CARBATROL) 200 MG 12 hr capsule Take 200 mg by mouth daily.  . cetirizine (ZYRTEC ALLERGY) 10 MG tablet Take 1 tablet (10 mg total) by mouth daily.  .  Cyanocobalamin (VITAMIN B12) 500 MCG TABS Take by mouth.  . cyclobenzaprine (FLEXERIL) 10 MG tablet Take 1 tablet (10 mg total) by mouth 2 (two) times daily as needed for muscle spasms.  . dapagliflozin propanediol (FARXIGA) 5 MG TABS tablet Take 5 mg by mouth daily.  Marland Kitchen. GNP GARLIC EXTRACT PO Take by mouth. 1000 per day per patient  . hydrOXYzine (ATARAX/VISTARIL) 10 MG tablet Take 10-30mg  1 hour before bedtime as needed for itching  . lisinopril (PRINIVIL,ZESTRIL) 40 MG tablet Take 40 mg by mouth daily.  . methimazole (TAPAZOLE) 5 MG tablet TK 1 T PO BID  . metoprolol tartrate (LOPRESSOR) 25 MG tablet Take 25 mg by mouth 2 (two) times daily.  . metroNIDAZOLE (FLAGYL) 500 MG tablet Take 1 tablet (500 mg total) by mouth every 8 (eight) hours.  . Multiple Vitamin (MULTIVITAMIN) capsule Take 1 capsule by mouth daily.  Letta Pate. ONETOUCH VERIO test strip U UTD  . rosuvastatin (CRESTOR) 10 MG tablet TK 1 T PO QD  . SYNJARDY 12.04-999 MG TABS TK 1 T PO BID  . traMADol (ULTRAM) 50 MG tablet Take 1 tablet (50 mg total) by mouth every 6 (six) hours as needed (mild pain).  . triamcinolone cream (KENALOG) 0.1 % Apply 1 application topically 2 (two) times daily. As needed for up to 3 weeks at a time. Do not use on the face  or neck.   No current facility-administered medications for this visit.  (Other)      REVIEW OF SYSTEMS: ROS    Positive for: Eyes   Negative for: Constitutional, Gastrointestinal, Neurological, Skin, Genitourinary, Musculoskeletal, HENT, Cardiovascular, Respiratory, Psychiatric, Allergic/Imm, Heme/Lymph   Last edited by Eldridge ScotKendrick, Glenda, LPN on 1/19/14788/24/2020  2:00 PM. (History)       ALLERGIES Allergies  Allergen Reactions  . Penicillins Itching    Has patient had a PCN reaction causing immediate rash, facial/tongue/throat swelling, SOB or lightheadedness with hypotension: No Has patient had a PCN reaction causing severe rash involving mucus membranes or skin necrosis: No Has patient  had a PCN reaction that required hospitalization No Has patient had a PCN reaction occurring within the last 10 years: No If all of the above answers are "NO", then may proceed with Cephalosporin use.    PAST MEDICAL HISTORY Past Medical History:  Diagnosis Date  . Cataract   . Epilepsy (HCC)   . Gout   . History of blood transfusion    "when I got my hysterectomy" (07/22/2017)  . Hyperlipidemia   . Hypertension   . Seizures (HCC)    "from epilepsy; the kind where I space out; last one was ~ 1 yr ago" (07/22/2017)  . Type II diabetes mellitus (HCC)    Past Surgical History:  Procedure Laterality Date  . ABDOMINAL HYSTERECTOMY    . APPENDECTOMY    . CATARACT EXTRACTION    . CATARACT EXTRACTION Rice/ INTRAOCULAR LENS  IMPLANT, BILATERAL Bilateral 2016-2017   right-left  . CHOLECYSTECTOMY  03/2009   lap/notes 04/25/2011  . LAPAROSCOPIC APPENDECTOMY N/A 07/21/2017   Procedure: APPENDECTOMY LAPAROSCOPIC;  Surgeon: Manus Ruddsuei, Matthew, MD;  Location: [facility] OR;  Service: General;  Laterality: N/A;  . TUBAL LIGATION      FAMILY HISTORY Family History  Problem Relation Age of Onset  . Diabetes Mother   . Hypertension Mother   . Heart failure Mother   . Cataracts Mother   . Diabetes Father   . Coronary artery disease Father   . Retinitis pigmentosa Sister   . Amblyopia Neg Hx   . Blindness Neg Hx   . Glaucoma Neg Hx   . Macular degeneration Neg Hx   . Retinal detachment Neg Hx   . Strabismus Neg Hx     SOCIAL HISTORY Social History   Tobacco Use  . Smoking status: Former Smoker    Packs/day: 0.50    Years: 20.00    Pack years: 10.00    Types: Cigarettes    Quit date: 2009    Years since quitting: 11.6  . Smokeless tobacco: Never Used  Substance Use Topics  . Alcohol use: Yes    Comment: 07/22/2017 "glass of wine maybe once/month"  . Drug use: No         OPHTHALMIC EXAM:  Base Eye Exam    Visual Acuity (Snellen - Linear)      Right Left   Dist cc 20/25 20/20 -2    Dist ph cc 20/20 -2 NI   Correction: Glasses       Tonometry (Tonopen, 1:55 PM)      Right Left   Pressure 14 14       Pupils      Dark Light Shape React APD   Right 3 2 Round Brisk None   Left 3 2 Round Brisk None       Visual Fields (Counting fingers)      Left Right  Full Full       Extraocular Movement      Right Left    Full, Ortho Full, Ortho       Neuro/Psych    Oriented x3: Yes   Mood/Affect: Normal       Dilation    Both eyes: 1.0% Mydriacyl, 2.5% Phenylephrine @ 1:55 PM        Slit Lamp and Fundus Exam    Slit Lamp Exam      Right Left   Lids/Lashes Dermatochalasis - upper lid Dermatochalasis - upper lid   Conjunctiva/Sclera Melanosis Melanosis   Cornea 1+ Punctate epithelial erosions, Well healed temporal cataract wounds 1+ Punctate epithelial erosions 1+ EVND   Anterior Chamber deep and clear Narrow angle deep and clear Narrow angle   Iris Round and dilated Round and dilated   Lens Posterior chamber intraocular lens with trace PCO Posterior chamber intraocular lens in excellent position   Vitreous Vitreous syneresis Vitreous syneresis, vitreous condensation superiorly       Fundus Exam      Right Left   Disc Pink and Sharp Pink and Sharp   C/D Ratio 0.3 0.4   Macula blunted foveal reflex, mild RPE mottling, scattered drusen, No heme or edema,  Good foveal reflex, mild RPE mottling, scattered drusen, No heme or edema   Vessels Vascular attenuation, Copper wiring, Tortuous Copper wiring, Vascular attenuation, AV crossing changes   Periphery Attached, mild lattice from 1030-1200 -- good laser surrounding Attached, superior lattice very peripheral from 1100-0100 and at 0400 -- good laser surrounding all lesions          IMAGING AND PROCEDURES  Imaging and Procedures for @TODAY @  OCT, Retina - OU - Both Eyes       Right Eye Quality was good. Central Foveal Thickness: 261. Progression has been stable. Findings include normal foveal contour,  no SRF, no IRF, vitreomacular adhesion .   Left Eye Quality was good. Central Foveal Thickness: 262. Progression has been stable. Findings include normal foveal contour, no SRF, no IRF, retinal drusen , vitreomacular adhesion  (Rare drusen).   Notes *Images captured and stored on drive  Diagnosis / Impression:  OU: NFP, no IRF/SRF OS: retinal druse   Clinical management:  See below  Abbreviations: NFP - Normal foveal profile. CME - cystoid macular edema. PED - pigment epithelial detachment. IRF - intraretinal fluid. SRF - subretinal fluid. EZ - ellipsoid zone. ERM - epiretinal membrane. ORA - outer retinal atrophy. ORT - outer retinal tubulation. 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 Retinal holes, bilateral  H33.323   3. Diabetes mellitus type 2 without retinopathy (HCC)  E11.9   4. Essential hypertension  I10   5. Hypertensive retinopathy of both eyes  H35.033   6. Retinal edema  H35.81 OCT, Retina - OU - Both Eyes  7. Pseudophakia of both eyes  Z96.1     1,2. Lattice degeneration Rice/ atrophic holes, both eyes  - patches of mild lattice with small atrophic retinal holes at 1200 oclock OU; OS with small additional patch at 0400  - no RT or RD on 360 peripheral exam OU  - S/P laser retinopexy OD (10.25.19) -- good laser in place  - S/P laser retinopexy OS (11.15.19) -- good laser surrounding patches of lattice  - f/u  1 year  3.Diabetes mellitus, type 2 without retinopathy  - The incidence, risk factors for progression, natural history and treatment options for diabetic retinopathy  were discussed with patient.    - The need for close monitoring of blood glucose, blood pressure, and serum lipids, avoiding cigarette or any type of tobacco, and the need for long term follow up was also discussed with patient.  - f/u in 1 year, sooner prn  4,5. Hypertensive  retinopathy OU  - discussed importance of tight BP control  - monitor  6. No retinal edema on exam or OCT  7. Pseudophakia OU  - s/p CE/IOL OU (Bevis OD, C. Groat OS)  - beautiful surgeries, doing well  - monitor   Ophthalmic Meds Ordered this visit:  No orders of the defined types were placed in this encounter.      Return in about 1 year (around 08/16/2020) for f/u lattice with atrophic holes OU.  There are no Patient Instructions on file for this visit.   Explained the diagnoses, plan, and follow up with the patient and they expressed understanding.  Patient expressed understanding of the importance of proper follow up care.   This document serves as a record of services personally performed by Gardiner Sleeper, MD, PhD. It was created on their behalf by Roselee Nova, COMT. The creation of this record is the provider's dictation and/or activities during the visit.  Electronically signed by: Roselee Nova, COMT 08/17/19 2:39 PM   Gardiner Sleeper, M.D., Ph.D. Diseases & Surgery of the Retina and Vitreous [facility]  I have reviewed the above documentation for accuracy and completeness, and I agree with the above. Gardiner Sleeper, M.D., Ph.D. 08/17/19 10:56 PM     Abbreviations: M myopia (nearsighted); A astigmatism; H hyperopia (farsighted); P presbyopia; Mrx spectacle prescription;  CTL contact lenses; OD right eye; OS left eye; OU both eyes  XT exotropia; ET esotropia; PEK punctate epithelial keratitis; PEE punctate epithelial erosions; DES dry eye syndrome; MGD meibomian gland dysfunction; ATs artificial tears; PFAT's preservative free artificial tears; [place] nuclear sclerotic cataract; PSC posterior subcapsular cataract; ERM epi-retinal membrane; PVD posterior vitreous detachment; RD retinal detachment; DM diabetes mellitus; DR diabetic retinopathy; NPDR non-proliferative diabetic retinopathy; PDR proliferative diabetic retinopathy; CSME clinically  significant macular edema; DME diabetic macular edema; dbh dot blot hemorrhages; CWS cotton wool spot; POAG primary open angle glaucoma; C/D cup-to-disc ratio; HVF humphrey visual field; GVF goldmann visual field; OCT optical coherence tomography; IOP intraocular pressure; BRVO Branch retinal vein occlusion; CRVO central retinal vein occlusion; CRAO central retinal artery occlusion; BRAO branch retinal artery occlusion; RT retinal tear; SB scleral buckle; PPV pars plana vitrectomy; VH Vitreous hemorrhage; PRP panretinal laser photocoagulation; IVK intravitreal kenalog; VMT vitreomacular traction; MH Macular hole;  NVD neovascularization of the disc; NVE neovascularization elsewhere; AREDS age related eye disease study; ARMD age related macular degeneration; POAG primary open angle glaucoma; EBMD epithelial/anterior basement membrane dystrophy; ACIOL anterior chamber intraocular lens; IOL intraocular lens; PCIOL posterior chamber intraocular lens; Phaco/IOL phacoemulsification with intraocular lens placement; [place] photorefractive keratectomy; LASIK laser assisted in situ keratomileusis; HTN hypertension; DM diabetes mellitus; COPD chronic obstructive pulmonary disease

## 2019-08-17 ENCOUNTER — Encounter (INDEPENDENT_AMBULATORY_CARE_PROVIDER_SITE_OTHER): Payer: Self-pay | Admitting: Ophthalmology

## 2019-08-17 ENCOUNTER — Other Ambulatory Visit: Payer: Self-pay

## 2019-08-17 ENCOUNTER — Ambulatory Visit (INDEPENDENT_AMBULATORY_CARE_PROVIDER_SITE_OTHER): Payer: Medicare Other | Admitting: Ophthalmology

## 2019-08-17 DIAGNOSIS — H35033 Hypertensive retinopathy, bilateral: Secondary | ICD-10-CM

## 2019-08-17 DIAGNOSIS — Z961 Presence of intraocular lens: Secondary | ICD-10-CM

## 2019-08-17 DIAGNOSIS — H33323 Round hole, bilateral: Secondary | ICD-10-CM | POA: Diagnosis not present

## 2019-08-17 DIAGNOSIS — E119 Type 2 diabetes mellitus without complications: Secondary | ICD-10-CM

## 2019-08-17 DIAGNOSIS — I10 Essential (primary) hypertension: Secondary | ICD-10-CM | POA: Diagnosis not present

## 2019-08-17 DIAGNOSIS — H35413 Lattice degeneration of retina, bilateral: Secondary | ICD-10-CM

## 2019-08-17 DIAGNOSIS — H3581 Retinal edema: Secondary | ICD-10-CM | POA: Diagnosis not present

## 2019-09-09 ENCOUNTER — Ambulatory Visit: Payer: Medicare Other | Admitting: Allergy

## 2019-09-09 NOTE — Progress Notes (Deleted)
Follow Up Note  RE: Dawn Rice MRN: [id number] DOB: [birth year] Date of Office Visit: 09/09/2019  Referring provider: Jilda Panda, MD Primary care provider: Jilda Panda, MD  Chief Complaint: No chief complaint on file.  History of Present Illness: I had the pleasure of seeing Dawn Rice for a follow up visit at [facility] on 09/09/2019. She is a 63 y.o. female, who is being followed for pruritus and dust mite allergy. Today she is here for regular follow up visit. Her previous allergy office visit was on 07/08/2019 with Dr. Maudie Mercury.   Pruritus Past history - Breaking out in pruritic rash for the past 3-4 months mainly on her upper and lower extremities. No triggers noted. Usually flares every 2-3 days. Tried OTC antihistamines, hydrocortisone cream and Lotrisone cream with minimal benefit. June 2020 bloodwork - CBC diff showed anemia, CMP and TSH unremarkable. Diabetes and HTN not at optimal control.  Interim history - Some benefit with zyrtec and hydroxyzine.  Today's skin testing showed: Positive to dust mites.  Start environmental control measures.  Discussed with patient that dust mite allergy may be contributing to her rash/pruritus but xerosis is still most likely the main contributor for her pruritis.  Start zyrtec 10mg  dailyfor the itching.  Start hydroxyzine 10-30mg  1 hour before bedtime as needed for itching.  May use triamcinolone cream 0.1% twice a day for rash flares as needed up to 3 weeks at a time. Do not use on the face or neck.  Take pictures when the rash flares.  Continue proper skin care.  Try Aquaphor or Vaniply ointment as a moisturizer.  House dust mite allergy Denies rhino conjunctivitis symptoms.  Today's skin testing positive to dust mite.   Start environmental control measures.   Return in about 2 months (around 09/08/2019).   Assessment and Plan: Dawn Rice is a 63 y.o. female with: No problem-specific  Assessment & Plan notes found for this encounter.  No follow-ups on file.  No orders of the defined types were placed in this encounter.  Lab Orders  No laboratory test(s) ordered today    Diagnostics: Spirometry:  Tracings reviewed. Her effort: {Blank single:19197::"Good reproducible efforts.","It was hard to get consistent efforts and there is a question as to whether this reflects a maximal maneuver.","Poor effort, data can not be interpreted."} FVC: ***L FEV1: ***L, ***% predicted FEV1/FVC ratio: ***% Interpretation: {Blank single:19197::"Spirometry consistent with mild obstructive disease","Spirometry consistent with moderate obstructive disease","Spirometry consistent with severe obstructive disease","Spirometry consistent with possible restrictive disease","Spirometry consistent with mixed obstructive and restrictive disease","Spirometry uninterpretable due to technique","Spirometry consistent with normal pattern","No overt abnormalities noted given today's efforts"}.  Please see scanned spirometry results for details.  Skin Testing: {Blank single:19197::"Select foods","Environmental allergy panel","Environmental allergy panel and select foods","Food allergy panel","None","Deferred due to recent antihistamines use"}. Positive test to: ***. Negative test to: ***.  Results discussed with patient/family.   Medication List:  Current Outpatient Medications  Medication Sig Dispense Refill  . allopurinol (ZYLOPRIM) 100 MG tablet Take 100 mg by mouth daily.    Marland Kitchen amLODipine (NORVASC) 2.5 MG tablet Take 2 tablets (5 mg total) by mouth daily. 30 tablet 0  . atorvastatin (LIPITOR) 80 MG tablet Take 80 mg by mouth daily.  2  . Biotin 5000 MCG CAPS Take by mouth 2 (two) times a day.    . carbamazepine (CARBATROL) 200 MG 12 hr capsule Take 200 mg by mouth daily.  3  . cetirizine (ZYRTEC ALLERGY) 10 MG tablet Take  1 tablet (10 mg total) by mouth daily. 30 tablet 3  . Cyanocobalamin (VITAMIN  B12) 500 MCG TABS Take by mouth.    . cyclobenzaprine (FLEXERIL) 10 MG tablet Take 1 tablet (10 mg total) by mouth 2 (two) times daily as needed for muscle spasms. 20 tablet 0  . dapagliflozin propanediol (FARXIGA) 5 MG TABS tablet Take 5 mg by mouth daily.    Marland Kitchen. GNP GARLIC EXTRACT PO Take by mouth. 1000 per day per patient    . hydrOXYzine (ATARAX/VISTARIL) 10 MG tablet Take 10-30mg  1 hour before bedtime as needed for itching 90 tablet 1  . lisinopril (PRINIVIL,ZESTRIL) 40 MG tablet Take 40 mg by mouth daily.  3  . methimazole (TAPAZOLE) 5 MG tablet TK 1 T PO BID  0  . metoprolol tartrate (LOPRESSOR) 25 MG tablet Take 25 mg by mouth 2 (two) times daily.    . metroNIDAZOLE (FLAGYL) 500 MG tablet Take 1 tablet (500 mg total) by mouth every 8 (eight) hours. 15 tablet 0  . Multiple Vitamin (MULTIVITAMIN) capsule Take 1 capsule by mouth daily.    Letta Pate. ONETOUCH VERIO test strip U UTD  2  . rosuvastatin (CRESTOR) 10 MG tablet TK 1 T PO QD  3  . SYNJARDY 12.04-999 MG TABS TK 1 T PO BID  1  . traMADol (ULTRAM) 50 MG tablet Take 1 tablet (50 mg total) by mouth every 6 (six) hours as needed (mild pain). 20 tablet 0  . triamcinolone cream (KENALOG) 0.1 % Apply 1 application topically 2 (two) times daily. As needed for up to 3 weeks at a time. Do not use on the face or neck. 80 g 1   No current facility-administered medications for this visit.    Allergies: Allergies  Allergen Reactions  . Penicillins Itching    Has patient had a PCN reaction causing immediate rash, facial/tongue/throat swelling, SOB or lightheadedness with hypotension: No Has patient had a PCN reaction causing severe rash involving mucus membranes or skin necrosis: No Has patient had a PCN reaction that required hospitalization No Has patient had a PCN reaction occurring within the last 10 years: No If all of the above answers are "NO", then may proceed with Cephalosporin use.   I reviewed her past medical history, social history,  family history, and environmental history and no significant changes have been reported from previous visit on 07/08/2019.  Review of Systems  Constitutional: Negative for appetite change, chills, fever and unexpected weight change.  HENT: Negative for congestion and rhinorrhea.   Eyes: Negative for itching.  Respiratory: Negative for cough, chest tightness, shortness of breath and wheezing.   Cardiovascular: Negative for chest pain.  Gastrointestinal: Negative for abdominal pain.  Genitourinary: Negative for difficulty urinating.  Skin: Positive for rash.  Allergic/Immunologic: Positive for environmental allergies.  Neurological: Negative for headaches.   Objective: There were no vitals taken for this visit. There is no height or weight on file to calculate BMI. Physical Exam  Constitutional: She is oriented to person, place, and time. She appears well-developed and well-nourished.  HENT:  Head: Normocephalic and atraumatic.  Right Ear: External ear normal.  Left Ear: External ear normal.  Nose: Nose normal.  Mouth/Throat: Oropharynx is clear and moist.  Eyes: Conjunctivae and EOM are normal.  Neck: Neck supple.  Cardiovascular: Normal rate, regular rhythm and normal heart sounds. Exam reveals no gallop and no friction rub.  No murmur heard. Pulmonary/Chest: Effort normal and breath sounds normal. She has no wheezes. She  has no rales.  Abdominal: Soft.  Neurological: She is alert and oriented to person, place, and time.  Skin: Skin is warm and dry. Rash noted.  Areas of papular hyperpigmentation on anterior shin area b/l.   Psychiatric: She has a normal mood and affect. Her behavior is normal.  Nursing note and vitals reviewed.  Previous notes and tests were reviewed. The plan was reviewed with the patient/family, and all questions/concerned were addressed.  It was my pleasure to see Floyd today and participate in her care. Please feel free to contact me with any questions or  concerns.  Sincerely,  Wyline Mood, DO Allergy & Immunology  [facility] office: [phone] [facility] office: [phone] [place] office: [phone]

## 2019-11-16 ENCOUNTER — Ambulatory Visit: Payer: Self-pay | Admitting: Cardiology

## 2019-11-16 NOTE — Progress Notes (Deleted)
Primary Physician/Referring:  Jilda Panda, MD  Patient ID: Dawn Rice, female    DOB: 10-29-1956, 63 y.o.   MRN: [id number]  No chief complaint on file.  HPI:    Dawn Rice  is a 63 y.o. ***  Past Medical History:  Diagnosis Date  . Cataract   . Epilepsy ([place])   . Gout   . History of blood transfusion    "when I got my hysterectomy" (07/22/2017)  . Hyperlipidemia   . Hypertension   . Seizures ([place])    "from epilepsy; the kind where I space out; last one was ~ 1 yr ago" (07/22/2017)  . Type II diabetes mellitus ([place])    Past Surgical History:  Procedure Laterality Date  . ABDOMINAL HYSTERECTOMY    . APPENDECTOMY    . CATARACT EXTRACTION    . CATARACT EXTRACTION W/ INTRAOCULAR LENS  IMPLANT, BILATERAL Bilateral 2016-2017   right-left  . CHOLECYSTECTOMY  03/2009   lap/notes 04/25/2011  . LAPAROSCOPIC APPENDECTOMY N/A 07/21/2017   Procedure: APPENDECTOMY LAPAROSCOPIC;  Surgeon: Donnie Mesa, MD;  Location: [facility];  Service: General;  Laterality: N/A;  . TUBAL LIGATION     Social History   Socioeconomic History  . Marital status: Widowed    Spouse name: Not on file  . Number of children: Not on file  . Years of education: Not on file  . Highest education level: Not on file  Occupational History  . Not on file  Social Needs  . Financial resource strain: Not on file  . Food insecurity    Worry: Not on file    Inability: Not on file  . Transportation needs    Medical: Not on file    Non-medical: Not on file  Tobacco Use  . Smoking status: Former Smoker    Packs/day: 0.50    Years: 20.00    Pack years: 10.00    Types: Cigarettes    Quit date: 2009    Years since quitting: 11.9  . Smokeless tobacco: Never Used  Substance and Sexual Activity  . Alcohol use: Yes    Comment: 07/22/2017 "glass of wine maybe once/month"  . Drug use: No  . Sexual activity: Not on file  Lifestyle  . Physical activity    Days per week: Not on file    Minutes per  session: Not on file  . Stress: Not on file  Relationships  . Social Herbalist on phone: Not on file    Gets together: Not on file    Attends religious service: Not on file    Active member of club or organization: Not on file    Attends meetings of clubs or organizations: Not on file    Relationship status: Not on file  . Intimate partner violence    Fear of current or ex partner: Not on file    Emotionally abused: Not on file    Physically abused: Not on file    Forced sexual activity: Not on file  Other Topics Concern  . Not on file  Social History Narrative  . Not on file   ROS  ***ROS Objective   Vitals with BMI 07/15/2019 07/08/2019 06/29/2019  Height - - '5\' 2"'   Weight - - 197 lbs 3 oz  BMI - - 84.78  Systolic 412 820 813  Diastolic 93 78 887  Pulse 63 88 72      ***Physical Exam Laboratory examination:   Labs 09/29/2019: CBC hemoglobin  9.2, hematocrit 31.5, WBC 8.5, platelet count 424., CMP normal, potassium 4.5, BUN 12, creatinine 0.86, EGFR greater than 60 mL.  Total cholesterol 169, triglycerides 91, HDL 54, LDL 97.  Non-HDL cholesterol 115.  TSH normal.  A1c 7.2%.  No results for input(s): NA, K, CL, CO2, GLUCOSE, BUN, CREATININE, CALCIUM, GFRNONAA, GFRAA in the last 8760 hours. CrCl cannot be calculated (Patient's most recent lab result is older than the maximum 21 days allowed.).  CMP Latest Ref Rng & Units 09/02/2017 07/23/2017 07/22/2017  Glucose 65 - 99 mg/dL 122(H) 87 119(H)  BUN 6 - 20 mg/dL '12 9 15  ' Creatinine 0.44 - 1.00 mg/dL 0.70 0.85 1.05(H)  Sodium 135 - 145 mmol/L 141 139 142  Potassium 3.5 - 5.1 mmol/L 4.4 4.0 3.9  Chloride 101 - 111 mmol/L 107 108 110  CO2 22 - 32 mmol/L '26 22 25  ' Calcium 8.9 - 10.3 mg/dL 9.4 8.6(L) 8.3(L)  Total Protein 6.5 - 8.1 g/dL - - -  Total Bilirubin 0.3 - 1.2 mg/dL - - -  Alkaline Phos 38 - 126 U/L - - -  AST 15 - 41 U/L - - -  ALT 14 - 54 U/L - - -   CBC Latest Ref Rng & Units 09/02/2017 07/23/2017 07/22/2017   WBC 4.0 - 10.5 K/uL 6.1 12.9(H) 15.4(H)  Hemoglobin 12.0 - 15.0 g/dL 11.1(L) 9.5(L) 9.3(L)  Hematocrit 36.0 - 46.0 % 35.6(L) 31.5(L) 30.3(L)  Platelets 150 - 400 K/uL 343 286 281   Lipid Panel     Component Value Date/Time   CHOL 169 01/19/2017 1002   TRIG 120 01/19/2017 1002   HDL 52 01/19/2017 1002   CHOLHDL 3.3 01/19/2017 1002   VLDL 24 01/19/2017 1002   LDLCALC 93 01/19/2017 1002   HEMOGLOBIN A1C Lab Results  Component Value Date   HGBA1C 6.6 (H) 01/19/2017   MPG 143 01/19/2017   TSH No results for input(s): TSH in the last 8760 hours. Medications and allergies   Allergies  Allergen Reactions  . Penicillins Itching    Has patient had a PCN reaction causing immediate rash, facial/tongue/throat swelling, SOB or lightheadedness with hypotension: No Has patient had a PCN reaction causing severe rash involving mucus membranes or skin necrosis: No Has patient had a PCN reaction that required hospitalization No Has patient had a PCN reaction occurring within the last 10 years: No If all of the above answers are "NO", then may proceed with Cephalosporin use.     Current Outpatient Medications  Medication Instructions  . allopurinol (ZYLOPRIM) 100 mg, Oral, Daily  . amLODipine (NORVASC) 5 mg, Oral, Daily  . atorvastatin (LIPITOR) 80 mg, Oral, Daily  . Biotin 5000 MCG CAPS Oral, 2 times daily  . carbamazepine (CARBATROL) 200 mg, Oral, Daily  . cetirizine (ZYRTEC ALLERGY) 10 mg, Oral, Daily  . Cyanocobalamin (VITAMIN B12) 500 MCG TABS Oral  . cyclobenzaprine (FLEXERIL) 10 mg, Oral, 2 times daily PRN  . dapagliflozin propanediol (FARXIGA) 5 mg, Oral, Daily  . GNP GARLIC EXTRACT PO Oral, 1000 per day per patient   . hydrOXYzine (ATARAX/VISTARIL) 10 MG tablet Take 10-73m 1 hour before bedtime as needed for itching  . lisinopril (ZESTRIL) 40 mg, Oral, Daily  . methimazole (TAPAZOLE) 5 MG tablet TK 1 T PO BID  . metoprolol tartrate (LOPRESSOR) 25 mg, 2 times daily  .  metroNIDAZOLE (FLAGYL) 500 mg, Oral, Every 8 hours  . Multiple Vitamin (MULTIVITAMIN) capsule 1 capsule, Oral, Daily  . ONETOUCH VERIO test strip  U UTD  . rosuvastatin (CRESTOR) 10 MG tablet TK 1 T PO QD  . SYNJARDY 12.04-999 MG TABS TK 1 T PO BID  . traMADol (ULTRAM) 50 mg, Oral, Every 6 hours PRN  . triamcinolone cream (KENALOG) 0.1 % 1 application, Topical, 2 times daily, As needed for up to 3 weeks at a time. Do not use on the face or neck.    Radiology:  No results found. Cardiac Studies:   ***  Assessment     ICD-10-CM   1. Atypical chest pain  R07.89   2. Uncontrolled type 2 diabetes mellitus with hyperglycemia (HCC)  E11.65   3. Essential hypertension  I10     ***  ***  Recommendations:  No orders of the defined types were placed in this encounter.   ***  Adrian Prows, MD, [facility] 11/16/2019, 10:03 AM [facility]. [place] Pager: [phone] Office: [phone] If no answer Cell [phone]

## 2019-11-30 ENCOUNTER — Ambulatory Visit: Payer: Self-pay | Admitting: Cardiology

## 2020-01-18 ENCOUNTER — Emergency Department (HOSPITAL_COMMUNITY)
Admission: EM | Admit: 2020-01-18 | Discharge: 2020-01-18 | Disposition: A | Discharge status: Home / Self Care | Payer: Medicare Other | Attending: Emergency Medicine | Admitting: Emergency Medicine

## 2020-01-18 ENCOUNTER — Encounter (HOSPITAL_COMMUNITY): Payer: Self-pay

## 2020-01-18 ENCOUNTER — Other Ambulatory Visit: Payer: Self-pay

## 2020-01-18 DIAGNOSIS — R531 Weakness: Secondary | ICD-10-CM | POA: Insufficient documentation

## 2020-01-18 DIAGNOSIS — R430 Anosmia: Secondary | ICD-10-CM | POA: Diagnosis not present

## 2020-01-18 DIAGNOSIS — U071 COVID-19: Secondary | ICD-10-CM | POA: Diagnosis not present

## 2020-01-18 DIAGNOSIS — R0602 Shortness of breath: Secondary | ICD-10-CM | POA: Diagnosis present

## 2020-01-18 DIAGNOSIS — R05 Cough: Secondary | ICD-10-CM | POA: Insufficient documentation

## 2020-01-18 DIAGNOSIS — Z87891 Personal history of nicotine dependence: Secondary | ICD-10-CM | POA: Diagnosis not present

## 2020-01-18 DIAGNOSIS — Z7984 Long term (current) use of oral hypoglycemic drugs: Secondary | ICD-10-CM | POA: Insufficient documentation

## 2020-01-18 DIAGNOSIS — Z79899 Other long term (current) drug therapy: Secondary | ICD-10-CM | POA: Insufficient documentation

## 2020-01-18 DIAGNOSIS — E119 Type 2 diabetes mellitus without complications: Secondary | ICD-10-CM | POA: Insufficient documentation

## 2020-01-18 DIAGNOSIS — I10 Essential (primary) hypertension: Secondary | ICD-10-CM | POA: Diagnosis not present

## 2020-01-18 NOTE — ED Provider Notes (Signed)
[facility] EMERGENCY DEPARTMENT Provider Note   CSN: [id number] Arrival date & time: 01/18/20  1343     History Chief Complaint  Patient presents with  . Covid/SOB  . Cough    Dawn Rice is a 64 y.o. female.  HPI She complains of illness since 01/05/2020.  She had general weakness, shortness of breath, cough, loss of taste and smell and occasional diarrhea.  [facility] on [address] in [facility] today and was diagnosed with COVID-19 based on nasal swab.  She also had a chest x-ray which showed multifocal pneumonia.  I reviewed the paperwork that they discharged her with.  She had normal vital signs at the facility.  They sent her here for evaluation.  Patient lives with a granddaughter who is not currently ill.  There are no other known modifying factors.    Past Medical History:  Diagnosis Date  . Cataract   . Epilepsy (HCC)   . Gout   . History of blood transfusion    "when I got my hysterectomy" (07/22/2017)  . Hyperlipidemia   . Hypertension   . Seizures ([facility])    "from epilepsy; the kind where I space out; last one was ~ 1 yr ago" (07/22/2017)  . Type II diabetes mellitus [facility])     Patient Active Problem List   Diagnosis Date Noted  . Diabetes mellitus without complication (HCC) 07/15/2019  . Pain due to onychomycosis of toenails of both feet 07/15/2019  . House dust mite allergy 07/08/2019  . Pruritus 06/29/2019  . Rash and other nonspecific skin eruption 06/29/2019  . Acute appendicitis 07/21/2017  . Essential hypertension, benign   . Chest pain 01/19/2017  . Hypertension 01/19/2017  . Hyperlipidemia 01/19/2017  . Diabetes (HCC) 01/19/2017  . Seizures (HCC) 01/19/2017  . Gout     Past Surgical History:  Procedure Laterality Date  . ABDOMINAL HYSTERECTOMY    . APPENDECTOMY    . CATARACT EXTRACTION    . CATARACT EXTRACTION W/ INTRAOCULAR LENS  IMPLANT, BILATERAL Bilateral 2016-2017   right-left  . CHOLECYSTECTOMY  03/2009   lap/notes 04/25/2011  . LAPAROSCOPIC APPENDECTOMY N/A 07/21/2017   Procedure: APPENDECTOMY LAPAROSCOPIC;  Surgeon: Manus Rudd, MD;  Location: [facility];  Service: General;  Laterality: N/A;  . TUBAL LIGATION       OB History   No obstetric history on file.     Family History  Problem Relation Age of Onset  . Diabetes Mother   . Hypertension Mother   . Heart failure Mother   . Cataracts Mother   . Diabetes Father   . Coronary artery disease Father   . Retinitis pigmentosa Sister   . Amblyopia Neg Hx   . Blindness Neg Hx   . Glaucoma Neg Hx   . Macular degeneration Neg Hx   . Retinal detachment Neg Hx   . Strabismus Neg Hx     Social History   Tobacco Use  . Smoking status: Former Smoker    Packs/day: 0.50    Years: 20.00    Pack years: 10.00    Types: Cigarettes    Quit date: 2009    Years since quitting: 12.0  . Smokeless tobacco: Never Used  Substance Use Topics  . Alcohol use: Yes    Comment: 07/22/2017 "glass of wine maybe once/month"  . Drug use: No    Home Medications Prior to Admission medications   Medication Sig Start Date End Date  Taking? Authorizing Provider  allopurinol (ZYLOPRIM) 100 MG tablet Take 100 mg by mouth daily.    [provider]  amLODipine (NORVASC) 2.5 MG tablet Take 2 tablets (5 mg total) by mouth daily. 01/20/17   Dhungel, Flonnie Overman, MD  atorvastatin (LIPITOR) 80 MG tablet Take 80 mg by mouth daily. 06/26/15   [provider]  Biotin 5000 MCG CAPS Take by mouth 2 (two) times a day.    [provider]  carbamazepine (CARBATROL) 200 MG 12 hr capsule Take 200 mg by mouth daily. 08/19/15   [provider]  cetirizine (ZYRTEC ALLERGY) 10 MG tablet Take 1 tablet (10 mg total) by mouth daily. 06/29/19   Garnet Sierras, DO  Cyanocobalamin (VITAMIN B12) 500 MCG TABS Take by mouth.    [provider]  cyclobenzaprine (FLEXERIL) 10 MG tablet Take 1 tablet (10 mg total) by  mouth 2 (two) times daily as needed for muscle spasms. 02/18/18   Volanda Napoleon, PA-C  dapagliflozin propanediol (FARXIGA) 5 MG TABS tablet Take 5 mg by mouth daily.    [provider]  GNP GARLIC EXTRACT PO Take by mouth. 1000 per day per patient    [provider]  hydrOXYzine (ATARAX/VISTARIL) 10 MG tablet Take 10-30mg  1 hour before bedtime as needed for itching 06/29/19   Garnet Sierras, DO  lisinopril (PRINIVIL,ZESTRIL) 40 MG tablet Take 40 mg by mouth daily. 07/05/15   [provider]  methimazole (TAPAZOLE) 5 MG tablet TK 1 T PO BID 10/15/18   [provider]  metoprolol tartrate (LOPRESSOR) 25 MG tablet Take 25 mg by mouth 2 (two) times daily.    [provider]  metroNIDAZOLE (FLAGYL) 500 MG tablet Take 1 tablet (500 mg total) by mouth every 8 (eight) hours. 07/24/17   Meuth, Brooke A, PA-C  Multiple Vitamin (MULTIVITAMIN) capsule Take 1 capsule by mouth daily.    [provider]  [facility] VERIO test strip U UTD 10/19/18   [provider]  rosuvastatin (CRESTOR) 10 MG tablet TK 1 T PO QD 10/19/18   [provider]  SYNJARDY 12.04-999 MG TABS TK 1 T PO BID 07/21/18   [provider]  traMADol (ULTRAM) 50 MG tablet Take 1 tablet (50 mg total) by mouth every 6 (six) hours as needed (mild pain). 07/24/17   Meuth, Brooke A, PA-C  triamcinolone cream (KENALOG) 0.1 % Apply 1 application topically 2 (two) times daily. As needed for up to 3 weeks at a time. Do not use on the face or neck. 06/29/19   Garnet Sierras, DO    Allergies    Penicillins  Review of Systems   Review of Systems  All other systems reviewed and are negative.   Physical Exam Updated Vital Signs BP (!) 155/87   Pulse 68   Temp 97.7 F (36.5 C) (Oral)   Resp 17   Ht 5\' 2"  (1.575 m)   Wt 82.6 kg   SpO2 98%   BMI 33.29 kg/m   Physical Exam Vitals and nursing note reviewed.  Constitutional:      General: She is not in acute distress.     Appearance: She is well-developed. She is not ill-appearing or diaphoretic.  HENT:     Head: Normocephalic and atraumatic.     Right Ear: External ear normal.     Left Ear: External ear normal.  Eyes:     Conjunctiva/sclera: Conjunctivae normal.     Pupils: Pupils are equal, round, and  reactive to light.  Neck:     Trachea: Phonation normal.  Cardiovascular:     Rate and Rhythm: Normal rate and regular rhythm.     Heart sounds: Normal heart sounds.     Comments: Normal pulse right radial artery. Pulmonary:     Effort: Pulmonary effort is normal. No respiratory distress.     Breath sounds: Normal breath sounds. No stridor.  Abdominal:     Palpations: Abdomen is soft.     Tenderness: There is no abdominal tenderness.  Musculoskeletal:        General: Normal range of motion.     Cervical back: Normal range of motion and neck supple.  Skin:    General: Skin is warm and dry.  Neurological:     Mental Status: She is alert and oriented to person, place, and time.     Cranial Nerves: No cranial nerve deficit.     Sensory: No sensory deficit.     Motor: No abnormal muscle tone.     Coordination: Coordination normal.  Psychiatric:        Mood and Affect: Mood normal.        Behavior: Behavior normal.        Thought Content: Thought content normal.        Judgment: Judgment normal.     ED Results / Procedures / Treatments   Labs (all labs ordered are listed, but only abnormal results are displayed) Labs Reviewed - No data to display  EKG EKG Interpretation  Date/Time:  Monday January 18 2020 13:48:37 EST Ventricular Rate:  69 PR Interval:  174 QRS Duration: 96 QT Interval:  422 QTC Calculation: 452 R Axis:   -22 Text Interpretation: Normal sinus rhythm Possible Anterior infarct , age undetermined Abnormal ECG since last tracing no significant change Confirmed by Mancel Bale [phone]) on 01/18/2020 2:25:31 PM   Radiology No results found.  Procedures Procedures  (including critical care time)  Medications Ordered in ED Medications - No data to display  ED Course  I have reviewed the triage vital signs and the nursing notes.  Pertinent labs & imaging results that were available during my care of the patient were reviewed by me and considered in my medical decision making (see chart for details).    MDM Rules/Calculators/A&P                       Patient Vitals for the past 24 hrs:  BP Temp Temp src Pulse Resp SpO2 Height Weight  01/18/20 1445 (!) 155/87 -- -- 68 17 98 % -- --  01/18/20 1430 (!) 132/91 -- -- 67 18 98 % -- --  01/18/20 1415 128/79 -- -- 70 19 100 % -- --  01/18/20 1414 -- 97.7 F (36.5 C) Oral -- -- -- -- --  01/18/20 1410 132/77 -- -- -- (!) 23 98 % -- --  01/18/20 1405 -- -- -- -- -- -- 5\' 2"  (1.575 m) 82.6 kg  01/18/20 1348 (!) 148/77 -- -- 75 20 94 % -- --    3:00 PM Reevaluation with update and discussion. After initial assessment and treatment, an updated evaluation reveals no change in clinical status, findings discussed with the patient and all questions were answered. 01/20/20   Medical Decision Making: COVID-19 infection without signs or symptoms of severe illness.  Vital signs are normal with exception of mild high blood pressure.  Patient is nontoxic and does not require advanced testing or  hospitalization at this time.  Dawn Rice was evaluated in Emergency Department on 01/18/2020 for the symptoms described in the history of present illness. She was evaluated in the context of the global COVID-19 pandemic, which necessitated consideration that the patient might be at risk for infection with the SARS-CoV-2 virus that causes COVID-19. Institutional protocols and algorithms that pertain to the evaluation of patients at risk for COVID-19 are in a state of rapid change based on information released by regulatory bodies including the CDC and federal and state organizations. These policies and algorithms were  followed during the patient's care in the ED.  CRITICAL CARE-no Performed by: Mancel Bale   Nursing Notes Reviewed/ Care Coordinated Applicable Imaging Reviewed Interpretation of Laboratory Data incorporated into ED treatment  The patient appears reasonably screened and/or stabilized for discharge and I doubt any other medical condition or other [facility] requiring further screening, evaluation, or treatment in the ED at this time prior to discharge.  Plan: Home Medications-OTC symptom treatment as needed; Home Treatments-rest, fluids, push diet; return here if the recommended treatment, does not improve the symptoms; Recommended follow up-PCP, as needed    Final Clinical Impression(s) / ED Diagnoses Final diagnoses:  COVID-19 virus infection    Rx / DC Orders ED Discharge Orders    None       Mancel Bale, MD 01/18/20 1501

## 2020-01-18 NOTE — ED Triage Notes (Signed)
Pt sent from urgent care following COVID test. Pt experiencing SOB, cough, loss of taste and smell, diarrhea since 01/05/20.

## 2020-01-18 NOTE — Discharge Instructions (Addendum)
You have a COVID-19 infection which is not serious at this time.  You have already been sick for about 12 days, and are likely improving.  To make sure you continue to improve, drink 4 to 5 glasses of water every day and eat 3 meals whether or not you are thirsty or hungry.  If you have a fever, you can use Tylenol 650 mg every 4 hours.  For cough use Robitussin-DM.  Return here or see your doctor if not better in 4 to 5 days.

## 2020-03-26 ENCOUNTER — Ambulatory Visit: Payer: Medicare Other | Attending: Internal Medicine

## 2020-03-26 DIAGNOSIS — Z23 Encounter for immunization: Secondary | ICD-10-CM

## 2020-03-26 NOTE — Progress Notes (Signed)
   Covid-19 Vaccination Clinic  Name:  Dawn Rice    MRN: [id number] DOB: [birth year]  03/26/2020  [name] was observed post Covid-19 immunization for 15 minutes without incident. She was provided with Vaccine Information Sheet and instruction to access the V-Safe system.   [name] was instructed to call 911 with any severe reactions post vaccine: Marland Kitchen Difficulty breathing  . Swelling of face and throat  . A fast heartbeat  . A bad rash all over body  . Dizziness and weakness   Immunizations Administered    Name Date Dose VIS Date Route   Pfizer COVID-19 Vaccine 03/26/2020 10:20 AM 0.3 mL 12/04/2019 Intramuscular   Manufacturer: [employer], [employer]   Lot: [id number]   NDC: [address]-5

## 2020-04-19 ENCOUNTER — Ambulatory Visit: Payer: Medicare Other

## 2020-08-16 ENCOUNTER — Encounter (INDEPENDENT_AMBULATORY_CARE_PROVIDER_SITE_OTHER): Payer: Medicare Other | Admitting: Ophthalmology

## 2020-08-16 DIAGNOSIS — E119 Type 2 diabetes mellitus without complications: Secondary | ICD-10-CM

## 2020-08-16 DIAGNOSIS — H3581 Retinal edema: Secondary | ICD-10-CM

## 2020-08-16 DIAGNOSIS — I10 Essential (primary) hypertension: Secondary | ICD-10-CM

## 2020-08-16 DIAGNOSIS — H33323 Round hole, bilateral: Secondary | ICD-10-CM

## 2020-08-16 DIAGNOSIS — H35033 Hypertensive retinopathy, bilateral: Secondary | ICD-10-CM

## 2020-08-16 DIAGNOSIS — Z961 Presence of intraocular lens: Secondary | ICD-10-CM

## 2020-08-16 DIAGNOSIS — H35413 Lattice degeneration of retina, bilateral: Secondary | ICD-10-CM

## 2020-09-05 ENCOUNTER — Encounter (INDEPENDENT_AMBULATORY_CARE_PROVIDER_SITE_OTHER): Payer: Medicare Other | Admitting: Ophthalmology

## 2020-09-07 ENCOUNTER — Encounter (INDEPENDENT_AMBULATORY_CARE_PROVIDER_SITE_OTHER): Payer: Medicare Other | Admitting: Ophthalmology

## 2020-09-08 ENCOUNTER — Encounter (INDEPENDENT_AMBULATORY_CARE_PROVIDER_SITE_OTHER): Payer: Medicare Other | Admitting: Ophthalmology

## 2020-09-15 NOTE — Progress Notes (Signed)
[facility] Note  09/19/2020     CHIEF COMPLAINT Patient presents for Retina Follow Up   HISTORY OF PRESENT ILLNESS: Dawn Rice is a 64 y.o. female who presents to the clinic today for:   HPI    Retina Follow Up    Patient presents with  Other.  In both eyes.  Severity is moderate.  Duration of 12.  Since onset it is stable.  I, the attending physician,  performed the HPI with the patient and updated documentation appropriately.          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 Patient states vision is about the same. Having some eye pain. Uses systane at night.       Last edited by Bernarda Caffey, MD on 09/19/2020  9:52 PM. (History)    pt states no change in vision since last exam, she states she has occasional pain, she states she uses AT"s PRN for dryness  Referring physician: Thurston Hole, O.D. [facility] [address] [place],  [place] [id number]  HISTORICAL INFORMATION:   Selected notes from the MEDICAL RECORD NUMBER Referred by Dr. Thurston Hole for concern of retinal hole and lattice degeneration LEE: 10.10.19 (S. Bernstorf) [BCVA: OD: 20/20 OS: 20/20] Ocular Hx-DES, pseudo OU, ocular HTN,  PMH-DM (A1C: 7.0, takes Janumet)    CURRENT MEDICATIONS: No current outpatient medications on file. (Ophthalmic Drugs)   No current facility-administered medications for this visit. (Ophthalmic Drugs)   Current Outpatient Medications (Other)  Medication Sig  . allopurinol (ZYLOPRIM) 100 MG tablet Take 100 mg by mouth daily.  Marland Kitchen amLODipine (NORVASC) 2.5 MG tablet Take 2 tablets (5 mg total) by mouth daily.  Marland Kitchen atorvastatin (LIPITOR) 80 MG tablet Take 80 mg by mouth daily.  . Biotin 5000 MCG CAPS Take by mouth 2 (two) times a day.  . carbamazepine (CARBATROL) 200 MG 12 hr capsule Take 200 mg by mouth daily.  . cetirizine (ZYRTEC ALLERGY) 10 MG tablet Take 1 tablet (10 mg total) by mouth daily.  .  Cyanocobalamin (VITAMIN B12) 500 MCG TABS Take by mouth.  . cyclobenzaprine (FLEXERIL) 10 MG tablet Take 1 tablet (10 mg total) by mouth 2 (two) times daily as needed for muscle spasms.  . dapagliflozin propanediol (FARXIGA) 5 MG TABS tablet Take 5 mg by mouth daily.  Marland Kitchen GNP GARLIC EXTRACT PO Take by mouth. 1000 per day per patient  . hydrOXYzine (ATARAX/VISTARIL) 10 MG tablet Take 10-30m 1 hour before bedtime as needed for itching  . lisinopril (PRINIVIL,ZESTRIL) 40 MG tablet Take 40 mg by mouth daily.  . methimazole (TAPAZOLE) 5 MG tablet TK 1 T PO BID  . metroNIDAZOLE (FLAGYL) 500 MG tablet Take 1 tablet (500 mg total) by mouth every 8 (eight) hours.  . Multiple Vitamin (MULTIVITAMIN) capsule Take 1 capsule by mouth daily.  .Glory RosebushVERIO test strip U UTD  . rosuvastatin (CRESTOR) 10 MG tablet TK 1 T PO QD  . SYNJARDY 12.04-999 MG TABS TK 1 T PO BID  . traMADol (ULTRAM) 50 MG tablet Take 1 tablet (50 mg total) by mouth every 6 (six) hours as needed (mild pain).  . triamcinolone cream (KENALOG) 0.1 % Apply 1 application topically 2 (two) times daily. As needed for up to 3 weeks at a time. Do not use on the face or neck.  . metoprolol tartrate (LOPRESSOR) 25 MG tablet Take 25 mg by mouth  2 (two) times daily.   No current facility-administered medications for this visit. (Other)      REVIEW OF SYSTEMS: ROS    Positive for: Eyes   Negative for: Constitutional, Gastrointestinal, Neurological, Skin, Genitourinary, Musculoskeletal, HENT, Cardiovascular, Respiratory, Psychiatric, Allergic/Imm, Heme/Lymph   Last edited by Elmore Guise, COT on 09/19/2020  2:29 PM. (History)       ALLERGIES Allergies  Allergen Reactions  . Penicillins Itching    Has patient had a PCN reaction causing immediate rash, facial/tongue/throat swelling, SOB or lightheadedness with hypotension: No Has patient had a PCN reaction causing severe rash involving mucus membranes or skin necrosis: No Has patient had  a PCN reaction that required hospitalization No Has patient had a PCN reaction occurring within the last 10 years: No If all of the above answers are "NO", then may proceed with Cephalosporin use.    PAST MEDICAL HISTORY Past Medical History:  Diagnosis Date  . Cataract   . Epilepsy ([place])   . Gout   . History of blood transfusion    "when I got my hysterectomy" (07/22/2017)  . Hyperlipidemia   . Hypertension   . Seizures ([place])    "from epilepsy; the kind where I space out; last one was ~ 1 yr ago" (07/22/2017)  . Type II diabetes mellitus ([place])    Past Surgical History:  Procedure Laterality Date  . ABDOMINAL HYSTERECTOMY    . APPENDECTOMY    . CATARACT EXTRACTION    . CATARACT EXTRACTION W/ INTRAOCULAR LENS  IMPLANT, BILATERAL Bilateral 2016-2017   right-left  . CHOLECYSTECTOMY  03/2009   lap/notes 04/25/2011  . LAPAROSCOPIC APPENDECTOMY N/A 07/21/2017   Procedure: APPENDECTOMY LAPAROSCOPIC;  Surgeon: Donnie Mesa, MD;  Location: [place];  Service: General;  Laterality: N/A;  . TUBAL LIGATION      FAMILY HISTORY Family History  Problem Relation Age of Onset  . Diabetes Mother   . Hypertension Mother   . Heart failure Mother   . Cataracts Mother   . Diabetes Father   . Coronary artery disease Father   . Retinitis pigmentosa Sister   . Amblyopia Neg Hx   . Blindness Neg Hx   . Glaucoma Neg Hx   . Macular degeneration Neg Hx   . Retinal detachment Neg Hx   . Strabismus Neg Hx     SOCIAL HISTORY Social History   Tobacco Use  . Smoking status: Former Smoker    Packs/day: 0.50    Years: 20.00    Pack years: 10.00    Types: Cigarettes    Quit date: 2009    Years since quitting: 12.7  . Smokeless tobacco: Never Used  Vaping Use  . Vaping Use: Never used  Substance Use Topics  . Alcohol use: Yes    Comment: 07/22/2017 "glass of wine maybe once/month"  . Drug use: No         OPHTHALMIC EXAM:  Base Eye Exam    Visual Acuity (Snellen - Linear)      Right  Left   Dist cc 20/30 20/40-1   Dist ph cc 20/20-3 20/25+1   Correction: Glasses       Tonometry (Tonopen, 2:31 PM)      Right Left   Pressure 22 19       Pupils      Dark Light Shape React APD   Right 3 2 Round Brisk None   Left 3 2 Round Brisk None       Visual Fields (  Counting fingers)      Left Right    Full Full       Extraocular Movement      Right Left    Full, Ortho Full, Ortho       Neuro/Psych    Oriented x3: Yes   Mood/Affect: Normal       Dilation    Both eyes: 1.0% Mydriacyl, 2.5% Phenylephrine @ 2:31 PM        Slit Lamp and Fundus Exam    Slit Lamp Exam      Right Left   Lids/Lashes Dermatochalasis - upper lid Dermatochalasis - upper lid   Conjunctiva/Sclera Melanosis Melanosis, temporal pinguecula   Cornea 1+ Punctate epithelial erosions, Well healed temporal cataract wounds 1+ Punctate epithelial erosions 1+ EBMD   Anterior Chamber deep and clear Narrow angle deep and clear Narrow angle   Iris Round and dilated Round and dilated   Lens Posterior chamber intraocular lens with trace PCO Posterior chamber intraocular lens in excellent position   Vitreous Vitreous syneresis Vitreous syneresis, vitreous condensation superiorly       Fundus Exam      Right Left   Disc Pink and Sharp Mild Pallor, Sharp rim, mild PPP   C/D Ratio 0.3 0.4   Macula Flat, good foveal reflex, mild RPE mottling, scattered drusen, No heme or edema,  Flat, blunted foveal reflex, mild RPE mottling, focal druse ST mac, No heme or edema   Vessels attenuated, Tortuous Vascular attenuation, Copper wiring, Tortuous   Periphery Attached, mild lattice from 1030-1200 -- good laser surrounding, No new lattice or RT/RD Attached, superior lattice very peripheral from 1100-0100 and at 0400 -- good laser surrounding all lesions, No new lattice or RT/RD        Refraction    Wearing Rx      Sphere Cylinder Axis Add   Right -1.00 +0.25 078 +2.50   Left [place] +1.00 135 +2.50   Type: PAL           IMAGING AND PROCEDURES  Imaging and Procedures for _0 @  OCT, Retina - OU - Both Eyes       Right Eye Quality was good. Central Foveal Thickness: 257. Progression has been stable. Findings include normal foveal contour, no SRF, no IRF, vitreomacular adhesion .   Left Eye Quality was good. Central Foveal Thickness: 260. Progression has been stable. Findings include normal foveal contour, no SRF, no IRF, retinal drusen , vitreomacular adhesion  (Rare drusen).   Notes *Images captured and stored on drive  Diagnosis / Impression:  OU: NFP, no IRF/SRF OS: retinal drusen   Clinical management:  See below  Abbreviations: NFP - Normal foveal profile. CME - cystoid macular edema. PED - pigment epithelial detachment. IRF - intraretinal fluid. SRF - subretinal fluid. EZ - ellipsoid zone. ERM - epiretinal membrane. ORA - outer retinal atrophy. ORT - outer retinal tubulation. 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 Retinal holes, bilateral  H33.323   3. Retinal edema  H35.81 OCT, Retina - OU - Both Eyes  4. Diabetes mellitus type 2 without retinopathy ([place])  E11.9   5. Essential hypertension  I10   6. Hypertensive retinopathy of both eyes  H35.033   7. Pseudophakia of both eyes  Z96.1     1,2. Lattice degeneration w/  atrophic holes, both eyes  - patches of mild lattice with small atrophic retinal holes at 1200 oclock OU; OS with small additional patch at 0400  - no other RT or RD on scleral depressed exam OU  - S/P laser retinopexy OD (10.25.19) -- good laser in place  - S/P laser retinopexy OS (11.15.19) -- good laser surrounding patches of lattice  - no new lattice, RT or RD  - pt wishes to follow up annually here  - f/u 1 year  3.Diabetes mellitus, type 2 without retinopathy  - The incidence, risk factors for progression, natural history and  treatment options for diabetic retinopathy  were discussed with patient.    - The need for close monitoring of blood glucose, blood pressure, and serum lipids, avoiding cigarette or any type of tobacco, and the need for long term follow up was also discussed with patient.  - f/u in 1 year, sooner prn  4,5. Hypertensive retinopathy OU  - discussed importance of tight BP control  - monitor  6. No retinal edema on exam or OCT  7. Pseudophakia OU  - s/p CE/IOL OU ([place] OD, C. [facility] OS)  - beautiful surgeries, doing well  - monitor   Ophthalmic Meds Ordered this visit:  No orders of the defined types were placed in this encounter.      Return in about 1 year (around 09/19/2021) for f/u lattice degeneration OU, DFE, OCT.  There are no Patient Instructions on file for this visit.  This document serves as a record of services personally performed by Gardiner Sleeper, MD, PhD. It was created on their behalf by Leeann Must, [place], an ophthalmic technician. The creation of this record is the provider's dictation and/or activities during the visit.    Electronically signed by: Leeann Must, [place] 09.23.2021 9:55 PM   This document serves as a record of services personally performed by Gardiner Sleeper, MD, PhD. It was created on their behalf by San Jetty. Owens Shark, OA an ophthalmic technician. The creation of this record is the provider's dictation and/or activities during the visit.    Electronically signed by: San Jetty. Marguerita Merles 09.27.2021 9:55 PM  Gardiner Sleeper, M.D., Ph.D. Diseases & Surgery of the Retina and [place] 09/19/2020   I have reviewed the above documentation for accuracy and completeness, and I agree with the above. Gardiner Sleeper, M.D., Ph.D. 09/19/20 9:55 PM   Abbreviations: M myopia (nearsighted); A astigmatism; H hyperopia (farsighted); P presbyopia; Mrx spectacle prescription;  CTL contact lenses; OD right eye; OS left eye; OU both  eyes  XT exotropia; ET esotropia; PEK punctate epithelial keratitis; PEE punctate epithelial erosions; DES dry eye syndrome; MGD meibomian gland dysfunction; ATs artificial tears; PFAT's preservative free artificial tears; [place] nuclear sclerotic cataract; PSC posterior subcapsular cataract; ERM epi-retinal membrane; PVD posterior vitreous detachment; RD retinal detachment; DM diabetes mellitus; DR diabetic retinopathy; NPDR non-proliferative diabetic retinopathy; PDR proliferative diabetic retinopathy; CSME clinically significant macular edema; DME diabetic macular edema; dbh dot blot hemorrhages; CWS cotton wool spot; POAG primary open angle glaucoma; C/D cup-to-disc ratio; HVF humphrey visual field; GVF goldmann visual field; OCT optical coherence tomography; IOP intraocular pressure; BRVO Branch retinal vein occlusion; CRVO central retinal vein occlusion; CRAO central retinal artery occlusion; BRAO branch retinal artery occlusion; RT retinal tear; SB scleral buckle; PPV pars plana vitrectomy; VH Vitreous hemorrhage; PRP panretinal laser photocoagulation; IVK intravitreal kenalog; VMT vitreomacular traction; MH Macular hole;  NVD neovascularization of  the disc; NVE neovascularization elsewhere; AREDS age related eye disease study; ARMD age related macular degeneration; POAG primary open angle glaucoma; EBMD epithelial/anterior basement membrane dystrophy; ACIOL anterior chamber intraocular lens; IOL intraocular lens; PCIOL posterior chamber intraocular lens; Phaco/IOL phacoemulsification with intraocular lens placement; [place] photorefractive keratectomy; LASIK laser assisted in situ keratomileusis; HTN hypertension; DM diabetes mellitus; COPD chronic obstructive pulmonary disease

## 2020-09-19 ENCOUNTER — Other Ambulatory Visit: Payer: Self-pay

## 2020-09-19 ENCOUNTER — Ambulatory Visit (INDEPENDENT_AMBULATORY_CARE_PROVIDER_SITE_OTHER): Payer: Medicare Other | Admitting: Ophthalmology

## 2020-09-19 ENCOUNTER — Encounter (INDEPENDENT_AMBULATORY_CARE_PROVIDER_SITE_OTHER): Payer: Self-pay | Admitting: Ophthalmology

## 2020-09-19 DIAGNOSIS — E119 Type 2 diabetes mellitus without complications: Secondary | ICD-10-CM

## 2020-09-19 DIAGNOSIS — H3581 Retinal edema: Secondary | ICD-10-CM

## 2020-09-19 DIAGNOSIS — I10 Essential (primary) hypertension: Secondary | ICD-10-CM

## 2020-09-19 DIAGNOSIS — H33323 Round hole, bilateral: Secondary | ICD-10-CM | POA: Diagnosis not present

## 2020-09-19 DIAGNOSIS — H35413 Lattice degeneration of retina, bilateral: Secondary | ICD-10-CM

## 2020-09-19 DIAGNOSIS — Z961 Presence of intraocular lens: Secondary | ICD-10-CM

## 2020-09-19 DIAGNOSIS — H35033 Hypertensive retinopathy, bilateral: Secondary | ICD-10-CM

## 2021-08-03 ENCOUNTER — Encounter (HOSPITAL_COMMUNITY): Payer: Self-pay

## 2021-08-03 ENCOUNTER — Emergency Department (HOSPITAL_COMMUNITY)
Admission: EM | Admit: 2021-08-03 | Discharge: 2021-08-04 | Disposition: A | Discharge status: Home / Self Care | Payer: Medicare Other | Attending: Emergency Medicine | Admitting: Emergency Medicine

## 2021-08-03 ENCOUNTER — Other Ambulatory Visit: Payer: Self-pay

## 2021-08-03 ENCOUNTER — Emergency Department (HOSPITAL_COMMUNITY): Payer: Medicare Other

## 2021-08-03 DIAGNOSIS — R0789 Other chest pain: Secondary | ICD-10-CM | POA: Insufficient documentation

## 2021-08-03 DIAGNOSIS — R059 Cough, unspecified: Secondary | ICD-10-CM | POA: Insufficient documentation

## 2021-08-03 DIAGNOSIS — E119 Type 2 diabetes mellitus without complications: Secondary | ICD-10-CM | POA: Diagnosis not present

## 2021-08-03 DIAGNOSIS — Z79899 Other long term (current) drug therapy: Secondary | ICD-10-CM | POA: Diagnosis not present

## 2021-08-03 DIAGNOSIS — I10 Essential (primary) hypertension: Secondary | ICD-10-CM | POA: Diagnosis not present

## 2021-08-03 DIAGNOSIS — Z87891 Personal history of nicotine dependence: Secondary | ICD-10-CM | POA: Insufficient documentation

## 2021-08-03 DIAGNOSIS — R0602 Shortness of breath: Secondary | ICD-10-CM

## 2021-08-03 DIAGNOSIS — R0600 Dyspnea, unspecified: Secondary | ICD-10-CM | POA: Insufficient documentation

## 2021-08-03 LAB — COMPREHENSIVE METABOLIC PANEL
ALT: 17 U/L (ref 0–44)
AST: 29 U/L (ref 15–41)
Albumin: 3.9 g/dL (ref 3.5–5.0)
Alkaline Phosphatase: 60 U/L (ref 38–126)
Anion gap: 9 (ref 5–15)
BUN: 12 mg/dL (ref 8–23)
CO2: 25 mmol/L (ref 22–32)
Calcium: 9.4 mg/dL (ref 8.9–10.3)
Chloride: 105 mmol/L (ref 98–111)
Creatinine, Ser: 1.15 mg/dL — ABNORMAL HIGH (ref 0.44–1.00)
GFR, Estimated: 54 mL/min — ABNORMAL LOW (ref >60)
Glucose, Bld: 104 mg/dL — ABNORMAL HIGH (ref 70–99)
Potassium: 3.8 mmol/L (ref 3.5–5.1)
Sodium: 139 mmol/L (ref 135–145)
Total Bilirubin: 0.2 mg/dL — ABNORMAL LOW (ref 0.3–1.2)
Total Protein: 7.4 g/dL (ref 6.5–8.1)

## 2021-08-03 LAB — BRAIN NATRIURETIC PEPTIDE: B Natriuretic Peptide: 9.7 pg/mL (ref 0.0–100.0)

## 2021-08-03 NOTE — ED Triage Notes (Signed)
Pt states that for the past few nights she has been awoken by the feeling that she is not breathing. Pt has not diagnosis of OSA. Denies CP/SOB.

## 2021-08-03 NOTE — ED Provider Notes (Signed)
Emergency Medicine Provider Triage Evaluation Note  Dawn Rice , a 65 y.o. female  was evaluated in triage.  Pt complains of sudden shortness of breath at night.  Patient reports that she wakes up gasping for air.  This has occurred over the last 2 nights.  Patient denies any chest pain, shortness of breath, orthopnea, leg swelling or tenderness.  Review of Systems  Positive: PND Negative: Chest pain, orthopnea, leg swelling or tenderness, abdominal pain, abdominal distention, nausea, vomiting  Physical Exam  BP (!) 174/96 (BP Location: Left Arm)   Pulse 93   Temp 99.2 F (37.3 C) (Oral)   Resp 14   Ht 5\' 2"  (1.575 m)   Wt 81.2 kg   SpO2 99%   BMI 32.74 kg/m  Gen:   Awake, no distress   Resp:  Normal effort, lungs clear to auscultation bilaterally MSK:   Moves extremities without difficulty, no swelling or tenderness to bilateral lower extremities Other:  Abdomen soft, nondistended, nontender.  Medical Decision Making  Medically screening exam initiated at 5:48 PM.  Appropriate orders placed.  Dawn Rice was informed that the remainder of the evaluation will be completed by another provider, this initial triage assessment does not replace that evaluation, and the importance of remaining in the ED until their evaluation is complete.  The patient appears stable so that the remainder of the work up may be completed by another provider.      [name], PA-C 08/03/21 1749    10/03/21, MD 08/03/21 [phone]

## 2021-08-04 MED ORDER — LORATADINE 10 MG PO TABS
10.0000 mg | ORAL_TABLET | Freq: Once | ORAL | Status: AC
  Administered 2021-08-04: 10 mg via ORAL
  Filled 2021-08-04: qty 1

## 2021-08-04 MED ORDER — LORATADINE 10 MG PO TABS: 10.0000 mg | ORAL_TABLET | Freq: Every day | ORAL | 0 refills | Status: DC

## 2021-08-04 NOTE — ED Provider Notes (Signed)
[facility] EMERGENCY DEPARTMENT Provider Note   CSN: [id number] Arrival date & time: 08/03/21  1644     History Chief Complaint  Patient presents with   Sleep Apnea    Dawn Rice is a 65 y.o. female.  65 year old female with a history of hypertension and indigestion allergies who presents emerged from today with episodes of dyspnea.  Patient states over the last couple nights she has had an episode each night around 5:00 the morning where she wakes up very short of breath.  She is able to breathe after a few seconds but when she does she feel some burning in her chest and she coughs quite a few times and some yellowish phlegm coughed up.  She then feels okay but she is worried so that she can go back to sleep.  She apparently called her doctor about this and they sent her up with appointment with gastroenterology however she cannot get that appointment time since that she was told to come to the ER.  No lower extremity swelling.  No chest pain.  No fevers.  No cough.  No ear pain.  No sore throat.  No sinus fullness or tenderness.  No recent illnesses.  Patient states that her blood pressure is always as high as it is now.  This is not abnormal for her.       Past Medical History:  Diagnosis Date   Cataract    Epilepsy (HCC)    Gout    History of blood transfusion    "when I got my hysterectomy" (07/22/2017)   Hyperlipidemia    Hypertension    Seizures ([facility])    "from epilepsy; the kind where I space out; last one was ~ 1 yr ago" (07/22/2017)   Type II diabetes mellitus ([facility])     Patient Active Problem List   Diagnosis Date Noted   Diabetes mellitus without complication (HCC) 07/15/2019   Pain due to onychomycosis of toenails of both feet 07/15/2019   House dust mite allergy 07/08/2019   Pruritus 06/29/2019   Rash and other nonspecific skin eruption 06/29/2019   Acute appendicitis 07/21/2017   Essential hypertension, benign    Chest pain 01/19/2017    Hypertension 01/19/2017   Hyperlipidemia 01/19/2017   Diabetes (HCC) 01/19/2017   Seizures (H[facility]) 01/19/2017   Gout     Past Surgical History:  Procedure Laterality Date   ABDOMINAL HYSTERECTOMY     APPENDECTOMY     CATARACT EXTRACTION     CATARACT EXTRACTION W/ INTRAOCULAR LENS  IMPLANT, BILATERAL Bilateral 2016-2017   right-left   CHOLECYSTECTOMY  03/2009   lap/notes 04/25/2011   LAPAROSCOPIC APPENDECTOMY N/A 07/21/2017   Procedure: APPENDECTOMY LAPAROSCOPIC;  Surgeon: Manus Rudd, MD;  Location: [facility] OR;  Service: General;  Laterality: N/A;   TUBAL LIGATION       OB History   No obstetric history on file.     Family History  Problem Relation Age of Onset   Diabetes Mother    Hypertension Mother    Heart failure Mother    Cataracts Mother    Diabetes Father    Coronary artery disease Father    Retinitis pigmentosa Sister    Amblyopia Neg Hx    Blindness Neg Hx    Glaucoma Neg Hx    Macular degeneration Neg Hx    Retinal detachment Neg Hx    Strabismus Neg Hx     Social History   Tobacco Use   Smoking status:  Former    Packs/day: 0.50    Years: 20.00    Pack years: 10.00    Types: Cigarettes    Quit date: 2009    Years since quitting: 13.6   Smokeless tobacco: Never  Vaping Use   Vaping Use: Never used  Substance Use Topics   Alcohol use: Yes    Comment: 07/22/2017 "glass of wine maybe once/month"   Drug use: No    Home Medications Prior to Admission medications   Medication Sig Start Date End Date Taking? Authorizing Provider  allopurinol (ZYLOPRIM) 100 MG tablet Take 100 mg by mouth daily.    [provider]  amLODipine (NORVASC) 2.5 MG tablet Take 2 tablets (5 mg total) by mouth daily. 01/20/17   Dhungel, Theda Belfast, MD  atorvastatin (LIPITOR) 80 MG tablet Take 80 mg by mouth daily. 06/26/15   [provider]  Biotin 5000 MCG CAPS Take by mouth 2 (two) times a day.    [provider]  carbamazepine (CARBATROL) 200 MG 12 hr  capsule Take 200 mg by mouth daily. 08/19/15   [provider]  cetirizine (ZYRTEC ALLERGY) 10 MG tablet Take 1 tablet (10 mg total) by mouth daily. 06/29/19   Ellamae Sia, DO  Cyanocobalamin (VITAMIN B12) 500 MCG TABS Take by mouth.    [provider]  cyclobenzaprine (FLEXERIL) 10 MG tablet Take 1 tablet (10 mg total) by mouth 2 (two) times daily as needed for muscle spasms. 02/18/18   Maxwell Caul, PA-C  dapagliflozin propanediol (FARXIGA) 5 MG TABS tablet Take 5 mg by mouth daily.    [provider]  GNP GARLIC EXTRACT PO Take by mouth. 1000 per day per patient    [provider]  hydrOXYzine (ATARAX/VISTARIL) 10 MG tablet Take 10-30mg  1 hour before bedtime as needed for itching 06/29/19   Ellamae Sia, DO  lisinopril (PRINIVIL,ZESTRIL) 40 MG tablet Take 40 mg by mouth daily. 07/05/15   [provider]  methimazole (TAPAZOLE) 5 MG tablet TK 1 T PO BID 10/15/18   [provider]  metoprolol tartrate (LOPRESSOR) 25 MG tablet Take 25 mg by mouth 2 (two) times daily.    [provider]  metroNIDAZOLE (FLAGYL) 500 MG tablet Take 1 tablet (500 mg total) by mouth every 8 (eight) hours. 07/24/17   Meuth, Brooke A, PA-C  Multiple Vitamin (MULTIVITAMIN) capsule Take 1 capsule by mouth daily.    [provider]  [facility] VERIO test strip U UTD 10/19/18   [provider]  rosuvastatin (CRESTOR) 10 MG tablet TK 1 T PO QD 10/19/18   [provider]  SYNJARDY 12.04-999 MG TABS TK 1 T PO BID 07/21/18   [provider]  traMADol (ULTRAM) 50 MG tablet Take 1 tablet (50 mg total) by mouth every 6 (six) hours as needed (mild pain). 07/24/17   Meuth, Brooke A, PA-C  triamcinolone cream (KENALOG) 0.1 % Apply 1 application topically 2 (two) times daily. As needed for up to 3 weeks at a time. Do not use on the face or neck. 06/29/19   Ellamae Sia, DO    Allergies    Penicillins  Review of Systems   Review of Systems  All  other systems reviewed and are negative.  Physical Exam Updated Vital Signs BP (!) 170/99   Pulse 88   Temp 98 F (36.7 C)   Resp 17   Ht 5\' 2"  (1.575 m)   Wt 81.2 kg   SpO2 98%  BMI 32.74 kg/m   Physical Exam Vitals and nursing note reviewed.  Constitutional:      Appearance: She is well-developed. She is obese.  HENT:     Head: Normocephalic and atraumatic.     Right Ear: Tympanic membrane normal.     Left Ear: Tympanic membrane normal.     Mouth/Throat:     Mouth: Mucous membranes are moist.     Pharynx: Posterior oropharyngeal erythema present.  Eyes:     Pupils: Pupils are equal, round, and reactive to light.  Neck:     Comments: No JVD Cardiovascular:     Rate and Rhythm: Normal rate and regular rhythm.     Heart sounds: No murmur heard. Pulmonary:     Effort: No respiratory distress.     Breath sounds: No stridor.  Abdominal:     General: There is no distension.  Musculoskeletal:        General: No swelling, tenderness or signs of injury. Normal range of motion.     Cervical back: Normal range of motion.  Skin:    General: Skin is warm and dry.  Neurological:     General: No focal deficit present.     Mental Status: She is alert.    ED Results / Procedures / Treatments   Labs (all labs ordered are listed, but only abnormal results are displayed) Labs Reviewed  COMPREHENSIVE METABOLIC PANEL - Abnormal; Notable for the following components:      Result Value   Glucose, Bld 104 (*)    Creatinine, Ser 1.15 (*)    Total Bilirubin 0.2 (*)    GFR, Estimated 54 (*)    All other components within normal limits  BRAIN NATRIURETIC PEPTIDE  CBC WITH DIFFERENTIAL/PLATELET    EKG None  Radiology DG Chest 2 View  Result Date: 08/03/2021 CLINICAL DATA:  Shortness of breath at night EXAM: CHEST - 2 VIEW COMPARISON:  12/30/2016 FINDINGS: The heart size and mediastinal contours are within normal limits. Both lungs are clear. The visualized skeletal structures  are unremarkable. IMPRESSION: No active cardiopulmonary disease. Electronically Signed   By: Jasmine PangKim  Fujinaga M.D.   On: 08/03/2021 19:00    Procedures Procedures   Medications Ordered in ED Medications  loratadine (CLARITIN) tablet 10 mg (has no administration in time range)    ED Course  I have reviewed the triage vital signs and the nursing notes.  Pertinent labs & imaging results that were available during my care of the patient were reviewed by me and considered in my medical decision making (see chart for details).    MDM Rules/Calculators/A&P                           Symptoms sound consistent with postnasal drip.  Her x-ray is okay and her lungs sound clear I do not think it is pneumonia.  Her BNP is normal and x-ray is clear so I think that new onset heart failure is unlikely as well.  She also has no physical exam findings otherwise of that.  Could possibly sleep apnea however does not make sense with a productive cough and it only happen once a night at the end of the night.  I do not see any evidence of a bacterial infection could be allergies.  We will try Claritin and PCP follow-up for further recommendations  Final Clinical Impression(s) / ED Diagnoses Final diagnoses:  None    Rx / DC Orders ED  Discharge Orders     None        Philippa Vessey, Barbara Cower, MD 08/04/21 [phone]

## 2021-09-14 ENCOUNTER — Institutional Professional Consult (permissible substitution): Payer: Medicare Other | Admitting: Pulmonary Disease

## 2021-09-19 ENCOUNTER — Encounter (INDEPENDENT_AMBULATORY_CARE_PROVIDER_SITE_OTHER): Payer: Medicare Other | Admitting: Ophthalmology

## 2021-09-19 DIAGNOSIS — H3581 Retinal edema: Secondary | ICD-10-CM

## 2021-09-19 DIAGNOSIS — I10 Essential (primary) hypertension: Secondary | ICD-10-CM

## 2021-09-19 DIAGNOSIS — H35413 Lattice degeneration of retina, bilateral: Secondary | ICD-10-CM

## 2021-09-19 DIAGNOSIS — E119 Type 2 diabetes mellitus without complications: Secondary | ICD-10-CM

## 2021-09-19 DIAGNOSIS — H35033 Hypertensive retinopathy, bilateral: Secondary | ICD-10-CM

## 2021-09-19 DIAGNOSIS — Z961 Presence of intraocular lens: Secondary | ICD-10-CM

## 2021-09-19 DIAGNOSIS — H33323 Round hole, bilateral: Secondary | ICD-10-CM

## 2021-10-11 ENCOUNTER — Institutional Professional Consult (permissible substitution): Payer: Medicare Other | Admitting: Pulmonary Disease

## 2021-11-06 ENCOUNTER — Institutional Professional Consult (permissible substitution): Payer: Medicare Other | Admitting: Pulmonary Disease

## 2022-01-02 ENCOUNTER — Ambulatory Visit (INDEPENDENT_AMBULATORY_CARE_PROVIDER_SITE_OTHER): Payer: Medicare Other | Admitting: Pulmonary Disease

## 2022-01-02 ENCOUNTER — Other Ambulatory Visit: Payer: Self-pay

## 2022-01-02 ENCOUNTER — Encounter: Payer: Self-pay | Admitting: Pulmonary Disease

## 2022-01-02 VITALS — BP 140/82 | HR 72 | Temp 98.4°F | Ht 62.0 in | Wt 192.4 lb

## 2022-01-02 DIAGNOSIS — R400 Somnolence: Secondary | ICD-10-CM

## 2022-01-02 DIAGNOSIS — R0602 Shortness of breath: Secondary | ICD-10-CM | POA: Diagnosis not present

## 2022-01-02 NOTE — Progress Notes (Signed)
Dawn Rice    [id number]    September 07, 1956  Primary Care Physician:Moreira, Carloyn Manner, MD  Referring Physician: Jilda Panda, MD [id number],  [place] [phone]  Chief complaint:   Shortness of breath at night Waking up choking  HPI:  History of snoring No history of witnessed apneas  Reformed smoker quit in 2009  No set bedtime, wakes up about 630 Not tired during the day, not sleepy during the day  No headaches in the morning, no dryness of the mouth in the mornings  May have had asthma growing up  No significant shortness of breath during the day  Unclear about what may be waking up from sleep  Outpatient Encounter Medications as of 01/02/2022  Medication Sig   allopurinol (ZYLOPRIM) 100 MG tablet Take 100 mg by mouth daily.   amLODipine (NORVASC) 2.5 MG tablet Take 2 tablets (5 mg total) by mouth daily.   atorvastatin (LIPITOR) 80 MG tablet Take 80 mg by mouth daily.   Biotin 5000 MCG CAPS Take by mouth 2 (two) times a day.   carbamazepine (CARBATROL) 200 MG 12 hr capsule Take 200 mg by mouth daily.   cetirizine (ZYRTEC ALLERGY) 10 MG tablet Take 1 tablet (10 mg total) by mouth daily.   Cyanocobalamin (VITAMIN B12) 500 MCG TABS Take by mouth.   cyclobenzaprine (FLEXERIL) 10 MG tablet Take 1 tablet (10 mg total) by mouth 2 (two) times daily as needed for muscle spasms.   dapagliflozin propanediol (FARXIGA) 5 MG TABS tablet Take 5 mg by mouth daily.   GNP GARLIC EXTRACT PO Take by mouth. 1000 per day per patient   hydrOXYzine (ATARAX/VISTARIL) 10 MG tablet Take 10-30mg  1 hour before bedtime as needed for itching   lisinopril (PRINIVIL,ZESTRIL) 40 MG tablet Take 40 mg by mouth daily.   loratadine (CLARITIN) 10 MG tablet Take 1 tablet (10 mg total) by mouth daily. One po daily x 5 days   methimazole (TAPAZOLE) 5 MG tablet TK 1 T PO BID   metoprolol tartrate (LOPRESSOR) 25 MG tablet Take 25 mg by mouth 2 (two) times daily.   metroNIDAZOLE (FLAGYL) 500  MG tablet Take 1 tablet (500 mg total) by mouth every 8 (eight) hours.   Multiple Vitamin (MULTIVITAMIN) capsule Take 1 capsule by mouth daily.   ONETOUCH VERIO test strip U UTD   rosuvastatin (CRESTOR) 10 MG tablet TK 1 T PO QD   SYNJARDY 12.04-999 MG TABS TK 1 T PO BID   traMADol (ULTRAM) 50 MG tablet Take 1 tablet (50 mg total) by mouth every 6 (six) hours as needed (mild pain).   triamcinolone cream (KENALOG) 0.1 % Apply 1 application topically 2 (two) times daily. As needed for up to 3 weeks at a time. Do not use on the face or neck.   No facility-administered encounter medications on file as of 01/02/2022.    Allergies as of 01/02/2022 - Review Complete 01/02/2022  Allergen Reaction Noted   Penicillins Itching 04/16/2014    Past Medical History:  Diagnosis Date   Cataract    Epilepsy ([place])    Gout    History of blood transfusion    "when I got my hysterectomy" (07/22/2017)   Hyperlipidemia    Hypertension    Seizures ([place])    "from epilepsy; the kind where I space out; last one was ~ 1 yr ago" (07/22/2017)   Type II diabetes mellitus ([place])     Past Surgical History:  Procedure Laterality Date   ABDOMINAL HYSTERECTOMY     APPENDECTOMY     CATARACT EXTRACTION     CATARACT EXTRACTION W/ INTRAOCULAR LENS  IMPLANT, BILATERAL Bilateral 2016-2017   right-left   CHOLECYSTECTOMY  03/2009   lap/notes 04/25/2011   LAPAROSCOPIC APPENDECTOMY N/A 07/21/2017   Procedure: APPENDECTOMY LAPAROSCOPIC;  Surgeon: Donnie Mesa, MD;  Location: [place];  Service: General;  Laterality: N/A;   TUBAL LIGATION      Family History  Problem Relation Age of Onset   Diabetes Mother    Hypertension Mother    Heart failure Mother    Cataracts Mother    Diabetes Father    Coronary artery disease Father    Retinitis pigmentosa Sister    Amblyopia Neg Hx    Blindness Neg Hx    Glaucoma Neg Hx    Macular degeneration Neg Hx    Retinal detachment Neg Hx    Strabismus Neg Hx     Social History    Socioeconomic History   Marital status: Widowed    Spouse name: Not on file   Number of children: Not on file   Years of education: Not on file   Highest education level: Not on file  Occupational History   Not on file  Tobacco Use   Smoking status: Former    Packs/day: 0.50    Years: 20.00    Pack years: 10.00    Types: Cigarettes    Quit date: 2009    Years since quitting: 14.0   Smokeless tobacco: Never  Vaping Use   Vaping Use: Never used  Substance and Sexual Activity   Alcohol use: Yes    Comment: 07/22/2017 "glass of wine maybe once/month"   Drug use: No   Sexual activity: Not on file  Other Topics Concern   Not on file  Social History Narrative   Not on file   Social Determinants of Health   Financial Resource Strain: Not on file  Food Insecurity: Not on file  Transportation Needs: Not on file  Physical Activity: Not on file  Stress: Not on file  Social Connections: Not on file  Intimate Partner Violence: Not on file    Review of Systems  Constitutional:  Negative for fatigue.  Psychiatric/Behavioral:  Positive for sleep disturbance.    Vitals:   01/02/22 1013  BP: 140/82  Pulse: 72  Temp: 98.4 F (36.9 C)  SpO2: 96%     Physical Exam Constitutional:      Appearance: She is obese.  HENT:     Head: Normocephalic.     Nose: No congestion.     Mouth/Throat:     Mouth: Mucous membranes are moist.  Eyes:     Pupils: Pupils are equal, round, and reactive to light.  Cardiovascular:     Rate and Rhythm: Normal rate and regular rhythm.     Heart sounds: No murmur heard.   No friction rub.  Pulmonary:     Effort: No respiratory distress.     Breath sounds: No stridor. No wheezing or rhonchi.  Musculoskeletal:     Cervical back: No rigidity or tenderness.  Neurological:     Mental Status: She is alert.  Psychiatric:        Mood and Affect: Mood normal.     Data Reviewed: No previous sleep study  Assessment:  Shortness of breath at  night  Multiple awakenings at night with shortness of breath  No underlying lung disease  History of  snoring  Obesity  Poor sleep hygiene  Plan/Recommendations: I will see her back in the office in 3 months  Optimize sleep hygiene  Schedule in lab sleep study  Schedule PFT  Encouraged to call with any significant concerns   Sherrilyn Rist MD [facility] 01/02/2022, 10:38 AM  CC: Jilda Panda, MD

## 2022-01-02 NOTE — Patient Instructions (Signed)
We will schedule you for PFT  Schedule him for a polysomnogram  I will see you in about 3 months  Try and structure your sleep as best as we can-set bedtime and a set wake up time to try and get 6 to 8 hours of sleep  Try and get regular exercises  Call with significant concerns

## 2022-01-02 NOTE — Progress Notes (Signed)
[facility] Note  01/03/2022     CHIEF COMPLAINT Patient presents for Retina Follow Up   HISTORY OF PRESENT ILLNESS: Dawn Rice is a 66 y.o. female who presents to the clinic today for:   HPI     Retina Follow Up   Patient presents with  Other.  In both eyes.  This started 16 months ago.  I, the attending physician,  performed the HPI with the patient and updated documentation appropriately.        Comments   Patient here for 12 months (16 months) retina follow up for lattice deg OU. Patient states vision sometimes like has pressure in eyes. Burning too OU. Has just a little bit of eye pain mostly pressure.       Last edited by Bernarda Caffey, MD on 01/06/2022  9:14 PM.    Pt is following with [facility] for her primary eye care since Dr. Shirley Muscat has retired   Referring physician: Thurston Hole, O.D. [facility] [address] [place],  [place] [address]  HISTORICAL INFORMATION:   Selected notes from the MEDICAL RECORD NUMBER Referred by Dr. Thurston Hole for concern of retinal hole and lattice degeneration LEE: 10.10.19 (S. Bernstorf) [BCVA: OD: 20/20 OS: 20/20] Ocular Hx-DES, pseudo OU, ocular HTN,  PMH-DM (A1C: 7.0, takes Janumet)    CURRENT MEDICATIONS: No current outpatient medications on file. (Ophthalmic Drugs)   No current facility-administered medications for this visit. (Ophthalmic Drugs)   Current Outpatient Medications (Other)  Medication Sig   allopurinol (ZYLOPRIM) 100 MG tablet Take 100 mg by mouth daily.   amLODipine (NORVASC) 2.5 MG tablet Take 2 tablets (5 mg total) by mouth daily.   atorvastatin (LIPITOR) 80 MG tablet Take 80 mg by mouth daily.   Biotin 5000 MCG CAPS Take by mouth 2 (two) times a day.   carbamazepine (CARBATROL) 200 MG 12 hr capsule Take 200 mg by mouth daily.   cetirizine (ZYRTEC ALLERGY) 10 MG tablet Take 1 tablet (10 mg total) by mouth daily.    Cyanocobalamin (VITAMIN B12) 500 MCG TABS Take by mouth.   cyclobenzaprine (FLEXERIL) 10 MG tablet Take 1 tablet (10 mg total) by mouth 2 (two) times daily as needed for muscle spasms.   dapagliflozin propanediol (FARXIGA) 5 MG TABS tablet Take 5 mg by mouth daily.   GNP GARLIC EXTRACT PO Take by mouth. 1000 per day per patient   hydrOXYzine (ATARAX/VISTARIL) 10 MG tablet Take 10-85m 1 hour before bedtime as needed for itching   lisinopril (PRINIVIL,ZESTRIL) 40 MG tablet Take 40 mg by mouth daily.   loratadine (CLARITIN) 10 MG tablet Take 1 tablet (10 mg total) by mouth daily. One po daily x 5 days   methimazole (TAPAZOLE) 5 MG tablet TK 1 T PO BID   metoprolol tartrate (LOPRESSOR) 25 MG tablet Take 25 mg by mouth 2 (two) times daily.   metroNIDAZOLE (FLAGYL) 500 MG tablet Take 1 tablet (500 mg total) by mouth every 8 (eight) hours.   Multiple Vitamin (MULTIVITAMIN) capsule Take 1 capsule by mouth daily.   ONETOUCH VERIO test strip U UTD   rosuvastatin (CRESTOR) 10 MG tablet TK 1 T PO QD   SYNJARDY 12.04-999 MG TABS TK 1 T PO BID   traMADol (ULTRAM) 50 MG tablet Take 1 tablet (50 mg total) by mouth every 6 (six) hours as needed (mild pain).   triamcinolone cream (KENALOG) 0.1 % Apply 1 application topically  2 (two) times daily. As needed for up to 3 weeks at a time. Do not use on the face or neck.   No current facility-administered medications for this visit. (Other)   REVIEW OF SYSTEMS: ROS   Positive for: Endocrine, Eyes Negative for: Constitutional, Gastrointestinal, Neurological, Skin, Genitourinary, Musculoskeletal, HENT, Cardiovascular, Respiratory, Psychiatric, Allergic/Imm, Heme/Lymph Last edited by Theodore Demark, COA on 01/03/2022  2:59 PM.     ALLERGIES Allergies  Allergen Reactions   Penicillins Itching    Has patient had a PCN reaction causing immediate rash, facial/tongue/throat swelling, SOB or lightheadedness with hypotension: No Has patient had a PCN reaction  causing severe rash involving mucus membranes or skin necrosis: No Has patient had a PCN reaction that required hospitalization No Has patient had a PCN reaction occurring within the last 10 years: No If all of the above answers are "NO", then may proceed with Cephalosporin use.    PAST MEDICAL HISTORY Past Medical History:  Diagnosis Date   Cataract    Epilepsy ([place])    Gout    History of blood transfusion    "when I got my hysterectomy" (07/22/2017)   Hyperlipidemia    Hypertension    Seizures ([place])    "from epilepsy; the kind where I space out; last one was ~ 1 yr ago" (07/22/2017)   Type II diabetes mellitus ([place])    Past Surgical History:  Procedure Laterality Date   ABDOMINAL HYSTERECTOMY     APPENDECTOMY     CATARACT EXTRACTION     CATARACT EXTRACTION W/ INTRAOCULAR LENS  IMPLANT, BILATERAL Bilateral 2016-2017   right-left   CHOLECYSTECTOMY  03/2009   lap/notes 04/25/2011   LAPAROSCOPIC APPENDECTOMY N/A 07/21/2017   Procedure: APPENDECTOMY LAPAROSCOPIC;  Surgeon: Donnie Mesa, MD;  Location: [place];  Service: General;  Laterality: N/A;   TUBAL LIGATION     FAMILY HISTORY Family History  Problem Relation Age of Onset   Diabetes Mother    Hypertension Mother    Heart failure Mother    Cataracts Mother    Diabetes Father    Coronary artery disease Father    Retinitis pigmentosa Sister    Amblyopia Neg Hx    Blindness Neg Hx    Glaucoma Neg Hx    Macular degeneration Neg Hx    Retinal detachment Neg Hx    Strabismus Neg Hx    SOCIAL HISTORY Social History   Tobacco Use   Smoking status: Former    Packs/day: 0.50    Years: 20.00    Pack years: 10.00    Types: Cigarettes    Quit date: 2009    Years since quitting: 14.0   Smokeless tobacco: Never  Vaping Use   Vaping Use: Never used  Substance Use Topics   Alcohol use: Yes    Comment: 07/22/2017 "glass of wine maybe once/month"   Drug use: No       OPHTHALMIC EXAM:  Base Eye Exam     Visual Acuity  (Snellen - Linear)       Right Left   Dist cc 20/20 -1 20/20 -1    Correction: Glasses         Tonometry (Tonopen, 2:56 PM)       Right Left   Pressure 17 16         Pupils       Dark Light Shape React APD   Right 3 2 Round Brisk None   Left 3 2 Round Brisk None  Visual Fields (Counting fingers)       Left Right    Full Full         Extraocular Movement       Right Left    Full, Ortho Full, Ortho         Neuro/Psych     Oriented x3: Yes   Mood/Affect: Normal         Dilation     Both eyes: 1.0% Mydriacyl, 2.5% Phenylephrine @ 2:56 PM           Slit Lamp and Fundus Exam     Slit Lamp Exam       Right Left   Lids/Lashes Dermatochalasis - upper lid, mild MGD Dermatochalasis - upper lid, mild MGD   Conjunctiva/Sclera Melanosis Melanosis, mild temporal pinguecula   Cornea mild arcus, trace PEE, well healed cataract wound mild arcus, well healed cataract wound, mild tear film debris   Anterior Chamber deep and clear Narrow angle deep and clear Narrow angle   Iris Round and dilated Round and dilated   Lens Posterior chamber intraocular lens with trace PCO nasally Posterior chamber intraocular lens in excellent position   Anterior Vitreous Vitreous syneresis Vitreous syneresis, vitreous condensation superiorly, Posterior vitreous detachment         Fundus Exam       Right Left   Disc Pink and Sharp Mild Pallor, Sharp rim, mild PPP   C/D Ratio 0.3 0.4   Macula Flat, good foveal reflex, mild RPE mottling, scattered drusen, No heme or edema Flat, blunted foveal reflex, mild RPE mottling, focal druse ST mac, No heme or edema   Vessels attenuated, Tortuous Vascular attenuation, Copper wiring, Tortuous   Periphery Attached, mild lattice from 1030-1200 -- good laser surrounding, No new lattice or RT/RD Attached, superior lattice very peripheral from 1100-0100 and at 0400 -- good laser surrounding all lesions, No new lattice or RT/RD            Refraction     Wearing Rx       Sphere Cylinder Axis Add   Right -1.00 +0.25 078 +2.50   Left [place] +1.00 135 +2.50    Type: PAL           IMAGING AND PROCEDURES  Imaging and Procedures for _0 @  OCT, Retina - OU - Both Eyes       Right Eye Quality was good. Central Foveal Thickness: 258. Progression has been stable. Findings include normal foveal contour, no SRF, no IRF, vitreomacular adhesion .   Left Eye Quality was good. Central Foveal Thickness: 259. Progression has been stable. Findings include normal foveal contour, no SRF, no IRF, retinal drusen , vitreomacular adhesion (Rare drusen).   Notes *Images captured and stored on drive  Diagnosis / Impression:  OU: NFP, no IRF/SRF OS: retinal drusen   Clinical management:  See below  Abbreviations: NFP - Normal foveal profile. CME - cystoid macular edema. PED - pigment epithelial detachment. IRF - intraretinal fluid. SRF - subretinal fluid. EZ - ellipsoid zone. ERM - epiretinal membrane. ORA - outer retinal atrophy. ORT - outer retinal tubulation. 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 Retinal holes, bilateral  H33.323     3. Diabetes mellitus type 2 without retinopathy ([employer])  E11.9  4. Essential hypertension  I10     5. Hypertensive retinopathy of both eyes  H35.033     6. Pseudophakia of both eyes  Z96.1      1,2. Lattice degeneration w/ atrophic holes, both eyes  - patches of mild lattice with small atrophic retinal holes at 1200 oclock OU; OS with small additional patch at 0400  - no other RT or RD on scleral depressed exam OU  - S/P laser retinopexy OD (10.25.19) -- good laser in place  - S/P laser retinopexy OS (11.15.19) -- good laser surrounding patches of lattice  - no new lattice, RT or RD  - pt is cleared from a retina standpoint for release  to [facility] and resumption of primary eye care  3. Diabetes mellitus, type 2 without retinopathy  - The incidence, risk factors for progression, natural history and treatment options for diabetic retinopathy  were discussed with patient.    - The need for close monitoring of blood glucose, blood pressure, and serum lipids, avoiding cigarette or any type of tobacco, and the need for long term follow up was also discussed with patient.  - monitor  4,5. Hypertensive retinopathy OU  - discussed importance of tight BP control  - monitor  6. Pseudophakia OU  - s/p CE/IOL OU (Bevis OD, C. Groat OS)  - beautiful surgeries, doing well  - monitor   Ophthalmic Meds Ordered this visit:  No orders of the defined types were placed in this encounter.    Return if symptoms worsen or fail to improve.  There are no Patient Instructions on file for this visit.  This document serves as a record of services personally performed by Gardiner Sleeper, MD, PhD. It was created on their behalf by Orvan Falconer, an ophthalmic technician. The creation of this record is the provider's dictation and/or activities during the visit.    Electronically signed by: Orvan Falconer, OA, 01/06/22  9:15 PM  This document serves as a record of services personally performed by Gardiner Sleeper, MD, PhD. It was created on their behalf by San Jetty. Owens Shark, OA an ophthalmic technician. The creation of this record is the provider's dictation and/or activities during the visit.    Electronically signed by: San Jetty. Owens Shark, [place] 01.11.2023 9:15 PM  Gardiner Sleeper, M.D., Ph.D. Diseases & Surgery of the Retina and Vitreous [facility]  I have reviewed the above documentation for accuracy and completeness, and I agree with the above. Gardiner Sleeper, M.D., Ph.D. 01/06/22 9:17 PM   Abbreviations: M myopia (nearsighted); A astigmatism; H hyperopia (farsighted); P presbyopia; Mrx spectacle  prescription;  CTL contact lenses; OD right eye; OS left eye; OU both eyes  XT exotropia; ET esotropia; PEK punctate epithelial keratitis; PEE punctate epithelial erosions; DES dry eye syndrome; MGD meibomian gland dysfunction; ATs artificial tears; PFAT's preservative free artificial tears; [place] nuclear sclerotic cataract; PSC posterior subcapsular cataract; ERM epi-retinal membrane; PVD posterior vitreous detachment; RD retinal detachment; DM diabetes mellitus; DR diabetic retinopathy; NPDR non-proliferative diabetic retinopathy; PDR proliferative diabetic retinopathy; CSME clinically significant macular edema; DME diabetic macular edema; dbh dot blot hemorrhages; CWS cotton wool spot; POAG primary open angle glaucoma; C/D cup-to-disc ratio; HVF humphrey visual field; GVF goldmann visual field; OCT optical coherence tomography; IOP intraocular pressure; BRVO Branch retinal vein occlusion; CRVO central retinal vein occlusion; CRAO central retinal artery occlusion; BRAO branch retinal artery occlusion; RT retinal tear; SB scleral buckle; PPV pars plana vitrectomy;  VH Vitreous hemorrhage; PRP panretinal laser photocoagulation; IVK intravitreal kenalog; VMT vitreomacular traction; MH Macular hole;  NVD neovascularization of the disc; NVE neovascularization elsewhere; AREDS age related eye disease study; ARMD age related macular degeneration; POAG primary open angle glaucoma; EBMD epithelial/anterior basement membrane dystrophy; ACIOL anterior chamber intraocular lens; IOL intraocular lens; PCIOL posterior chamber intraocular lens; Phaco/IOL phacoemulsification with intraocular lens placement; [place] photorefractive keratectomy; LASIK laser assisted in situ keratomileusis; HTN hypertension; DM diabetes mellitus; COPD chronic obstructive pulmonary disease

## 2022-01-03 ENCOUNTER — Encounter (INDEPENDENT_AMBULATORY_CARE_PROVIDER_SITE_OTHER): Payer: Self-pay | Admitting: Ophthalmology

## 2022-01-03 ENCOUNTER — Ambulatory Visit (INDEPENDENT_AMBULATORY_CARE_PROVIDER_SITE_OTHER): Payer: Medicare Other | Admitting: Ophthalmology

## 2022-01-03 DIAGNOSIS — H33323 Round hole, bilateral: Secondary | ICD-10-CM

## 2022-01-03 DIAGNOSIS — H35413 Lattice degeneration of retina, bilateral: Secondary | ICD-10-CM | POA: Diagnosis not present

## 2022-01-03 DIAGNOSIS — I10 Essential (primary) hypertension: Secondary | ICD-10-CM

## 2022-01-03 DIAGNOSIS — H35033 Hypertensive retinopathy, bilateral: Secondary | ICD-10-CM

## 2022-01-03 DIAGNOSIS — Z961 Presence of intraocular lens: Secondary | ICD-10-CM

## 2022-01-03 DIAGNOSIS — E119 Type 2 diabetes mellitus without complications: Secondary | ICD-10-CM

## 2022-01-06 ENCOUNTER — Encounter (INDEPENDENT_AMBULATORY_CARE_PROVIDER_SITE_OTHER): Payer: Self-pay | Admitting: Ophthalmology

## 2022-01-25 NOTE — Progress Notes (Deleted)
Dawn Lieu, MD Reason for referral-chest pain  HPI: 66 year old female for evaluation of chest pain at request of Ralene Ok, MD.  Echocardiogram July 2010 showed normal LV function, moderate left ventricular hypertrophy.  Nuclear study January 2018 showed ejection fraction 57% and no ischemia or infarction.  Current Outpatient Medications  Medication Sig Dispense Refill   allopurinol (ZYLOPRIM) 100 MG tablet Take 100 mg by mouth daily.     amLODipine (NORVASC) 2.5 MG tablet Take 2 tablets (5 mg total) by mouth daily. 30 tablet 0   atorvastatin (LIPITOR) 80 MG tablet Take 80 mg by mouth daily.  2   Biotin 5000 MCG CAPS Take by mouth 2 (two) times a day.     carbamazepine (CARBATROL) 200 MG 12 hr capsule Take 200 mg by mouth daily.  3   cetirizine (ZYRTEC ALLERGY) 10 MG tablet Take 1 tablet (10 mg total) by mouth daily. 30 tablet 3   Cyanocobalamin (VITAMIN B12) 500 MCG TABS Take by mouth.     cyclobenzaprine (FLEXERIL) 10 MG tablet Take 1 tablet (10 mg total) by mouth 2 (two) times daily as needed for muscle spasms. 20 tablet 0   dapagliflozin propanediol (FARXIGA) 5 MG TABS tablet Take 5 mg by mouth daily.     GNP GARLIC EXTRACT PO Take by mouth. 1000 per day per patient     hydrOXYzine (ATARAX/VISTARIL) 10 MG tablet Take 10-30mg  1 hour before bedtime as needed for itching 90 tablet 1   lisinopril (PRINIVIL,ZESTRIL) 40 MG tablet Take 40 mg by mouth daily.  3   loratadine (CLARITIN) 10 MG tablet Take 1 tablet (10 mg total) by mouth daily. One po daily x 5 days 20 tablet 0   methimazole (TAPAZOLE) 5 MG tablet TK 1 T PO BID  0   metoprolol tartrate (LOPRESSOR) 25 MG tablet Take 25 mg by mouth 2 (two) times daily.     metroNIDAZOLE (FLAGYL) 500 MG tablet Take 1 tablet (500 mg total) by mouth every 8 (eight) hours. 15 tablet 0   Multiple Vitamin (MULTIVITAMIN) capsule Take 1 capsule by mouth daily.     ONETOUCH VERIO test strip U UTD  2   rosuvastatin (CRESTOR) 10 MG tablet TK  1 T PO QD  3   SYNJARDY 12.04-999 MG TABS TK 1 T PO BID  1   traMADol (ULTRAM) 50 MG tablet Take 1 tablet (50 mg total) by mouth every 6 (six) hours as needed (mild pain). 20 tablet 0   triamcinolone cream (KENALOG) 0.1 % Apply 1 application topically 2 (two) times daily. As needed for up to 3 weeks at a time. Do not use on the face or neck. 80 g 1   No current facility-administered medications for this visit.    Allergies  Allergen Reactions   Penicillins Itching    Has patient had a PCN reaction causing immediate rash, facial/tongue/throat swelling, SOB or lightheadedness with hypotension: No Has patient had a PCN reaction causing severe rash involving mucus membranes or skin necrosis: No Has patient had a PCN reaction that required hospitalization No Has patient had a PCN reaction occurring within the last 10 years: No If all of the above answers are "NO", then may proceed with Cephalosporin use.     Past Medical History:  Diagnosis Date   Cataract    Epilepsy (HCC)    Gout    History of blood transfusion    "when I got my hysterectomy" (07/22/2017)   Hyperlipidemia  Hypertension    Seizures ([place])    "from epilepsy; the kind where I space out; last one was ~ 1 yr ago" (07/22/2017)   Type II diabetes mellitus ([place])     Past Surgical History:  Procedure Laterality Date   ABDOMINAL HYSTERECTOMY     APPENDECTOMY     CATARACT EXTRACTION     CATARACT EXTRACTION W/ INTRAOCULAR LENS  IMPLANT, BILATERAL Bilateral 2016-2017   right-left   CHOLECYSTECTOMY  03/2009   lap/notes 04/25/2011   LAPAROSCOPIC APPENDECTOMY N/A 07/21/2017   Procedure: APPENDECTOMY LAPAROSCOPIC;  Surgeon: Donnie Mesa, MD;  Location: [place];  Service: General;  Laterality: N/A;   TUBAL LIGATION      Social History   Socioeconomic History   Marital status: Widowed    Spouse name: Not on file   Number of children: Not on file   Years of education: Not on file   Highest education level: Not on file   Occupational History   Not on file  Tobacco Use   Smoking status: Former    Packs/day: 0.50    Years: 20.00    Pack years: 10.00    Types: Cigarettes    Quit date: 2009    Years since quitting: 14.0   Smokeless tobacco: Never  Vaping Use   Vaping Use: Never used  Substance and Sexual Activity   Alcohol use: Yes    Comment: 07/22/2017 "glass of wine maybe once/month"   Drug use: No   Sexual activity: Not on file  Other Topics Concern   Not on file  Social History Narrative   Not on file   Social Determinants of Health   Financial Resource Strain: Not on file  Food Insecurity: Not on file  Transportation Needs: Not on file  Physical Activity: Not on file  Stress: Not on file  Social Connections: Not on file  Intimate Partner Violence: Not on file    Family History  Problem Relation Age of Onset   Diabetes Mother    Hypertension Mother    Heart failure Mother    Cataracts Mother    Diabetes Father    Coronary artery disease Father    Retinitis pigmentosa Sister    Amblyopia Neg Hx    Blindness Neg Hx    Glaucoma Neg Hx    Macular degeneration Neg Hx    Retinal detachment Neg Hx    Strabismus Neg Hx     ROS: no fevers or chills, productive cough, hemoptysis, dysphasia, odynophagia, melena, hematochezia, dysuria, hematuria, rash, seizure activity, orthopnea, PND, pedal edema, claudication. Remaining systems are negative.  Physical Exam:   There were no vitals taken for this visit.  General:  Well developed/well nourished in NAD Skin warm/dry Patient not depressed No peripheral clubbing Back-normal HEENT-normal/normal eyelids Neck supple/normal carotid upstroke bilaterally; no bruits; no JVD; no thyromegaly chest - CTA/ normal expansion CV - RRR/normal S1 and S2; no murmurs, rubs or gallops;  PMI nondisplaced Abdomen -NT/ND, no HSM, no mass, + bowel sounds, no bruit 2+ femoral pulses, no bruits Ext-no edema, chords, 2+ DP Neuro-grossly nonfocal  ECG -  personally reviewed  A/P  1 chest pain-  2 hypertension-patient's blood pressure is controlled.  Continue present medical regimen.  3 hyperlipidemia-continue Crestor at present dose.  If coronary artery disease is demonstrated CTA increase to 40 mg daily.  Kirk Ruths, MD

## 2022-01-26 ENCOUNTER — Ambulatory Visit: Payer: Medicare Other | Admitting: Cardiology

## 2022-02-01 ENCOUNTER — Ambulatory Visit (HOSPITAL_BASED_OUTPATIENT_CLINIC_OR_DEPARTMENT_OTHER): Payer: Medicare Other | Attending: Pulmonary Disease | Admitting: Pulmonary Disease

## 2022-02-01 ENCOUNTER — Other Ambulatory Visit: Payer: Self-pay

## 2022-02-01 ENCOUNTER — Encounter (INDEPENDENT_AMBULATORY_CARE_PROVIDER_SITE_OTHER): Payer: Self-pay

## 2022-02-01 DIAGNOSIS — G4736 Sleep related hypoventilation in conditions classified elsewhere: Secondary | ICD-10-CM | POA: Diagnosis not present

## 2022-02-01 DIAGNOSIS — G4700 Insomnia, unspecified: Secondary | ICD-10-CM | POA: Diagnosis not present

## 2022-02-01 DIAGNOSIS — R0683 Snoring: Secondary | ICD-10-CM | POA: Insufficient documentation

## 2022-02-01 DIAGNOSIS — R5383 Other fatigue: Secondary | ICD-10-CM | POA: Insufficient documentation

## 2022-02-01 DIAGNOSIS — R400 Somnolence: Secondary | ICD-10-CM | POA: Diagnosis present

## 2022-02-01 DIAGNOSIS — G478 Other sleep disorders: Secondary | ICD-10-CM | POA: Diagnosis not present

## 2022-02-01 DIAGNOSIS — G4733 Obstructive sleep apnea (adult) (pediatric): Secondary | ICD-10-CM | POA: Diagnosis not present

## 2022-02-07 ENCOUNTER — Encounter: Payer: Self-pay | Admitting: Cardiology

## 2022-02-07 ENCOUNTER — Ambulatory Visit: Payer: Medicare Other | Admitting: Cardiology

## 2022-02-07 NOTE — Progress Notes (Deleted)
Primary Care Provider: Jilda Panda, MD [facility] Cardiologist: None Electrophysiologist: None  Clinic Note: No chief complaint on file.   ===================================  ASSESSMENT/PLAN   Problem List Items Addressed This Visit   None   ===================================  HPI:    Dawn Rice is a 66 y.o. female CRS of HTN, HLD, DM-2 (and being evaluated for OSA), Seizure Disorder is being seen today for the evaluation of *** at the request of Jilda Panda, MD.  Recent Hospitalizations:  08/03/2021-[facility] ER visit for shortness of breath => several mornings in a row waking up at roughly 5 in the morning very short of breath.  Catches her breath in a few seconds.  Occasionally feels burning in her chest and then coughs a few times.  Unable to go back to sleep because of concern.  Denied chest pain. => BNP was 9.7, and chest x-ray clear.  Referred to pulmonary medicine for OSA evaluation.  Dawn Rice was last seen on November 29, 2021 by Dr. Mellody Drown.  She apparently had stopped taking her beta-blocker (started in September) because of unsteady gait.  Also noted that her fingernails are peeling. Beta-blocker discontinued.  Urgent visit on January 25.  Complaining of chest pain-tightness ongoing for a while.  Limits activity.  EKG was normal.  Referred for cardiology evaluation.    Reviewed  CV studies:    The following studies were reviewed today: (if available, images/films reviewed: From Epic Chart or Care Everywhere) 01/20/2017-Myoview: NORMAL EF 57%.  NO ISCHEMIA OR INFARCTION.  LOW RISK   Interval History:   Dawn Rice   CV Review of Symptoms (Summary) Cardiovascular ROS: {roscv:310661}  REVIEWED OF SYSTEMS   ROS  I have reviewed and (if needed) personally updated the patient's problem list, medications, allergies, past medical and surgical history, social and family history.   PAST MEDICAL HISTORY   Past Medical History:   Diagnosis Date   Cataract    Epilepsy ([place])    Gout    History of blood transfusion    "when I got my hysterectomy" (07/22/2017)   Hyperlipidemia    Hypertension    Seizures ([place])    "from epilepsy; the kind where I space out; last one was ~ 1 yr ago" (07/22/2017)   Type II diabetes mellitus ([place])     PAST SURGICAL HISTORY   Past Surgical History:  Procedure Laterality Date   ABDOMINAL HYSTERECTOMY     APPENDECTOMY     CATARACT EXTRACTION     CATARACT EXTRACTION W/ INTRAOCULAR LENS  IMPLANT, BILATERAL Bilateral 2016-2017   right-left   CHOLECYSTECTOMY  03/2009   lap/notes 04/25/2011   LAPAROSCOPIC APPENDECTOMY N/A 07/21/2017   Procedure: APPENDECTOMY LAPAROSCOPIC;  Surgeon: Donnie Mesa, MD;  Location: [place];  Service: General;  Laterality: N/A;   TUBAL LIGATION      Immunization History  Administered Date(s) Administered   Influenza,inj,Quad PF,6+ Mos 10/24/2021   PFIZER(Purple Top)SARS-COV-2 Vaccination 03/26/2020, 05/02/2020    MEDICATIONS/ALLERGIES   No outpatient medications have been marked as taking for the 02/07/22 encounter (Appointment) with Leonie Man, MD.  Medication list: Carbamazepine 200 mg daily, lisinopril 40 mg daily, rosuvastatin 10 mg daily, Synjardy 12.5 mg / 1000 mg twice daily, without improvement milligram daily.  Also takes a super beets OTC.   Allergies  Allergen Reactions   Penicillins Itching    Has patient had a PCN reaction causing immediate rash, facial/tongue/throat swelling, SOB or lightheadedness with hypotension: No Has patient had a PCN reaction  causing severe rash involving mucus membranes or skin necrosis: No Has patient had a PCN reaction that required hospitalization No Has patient had a PCN reaction occurring within the last 10 years: No If all of the above answers are "NO", then may proceed with Cephalosporin use.    SOCIAL HISTORY/FAMILY HISTORY   Reviewed in Epic:   Social History   Tobacco Use   Smoking status:  Former    Packs/day: 0.50    Years: 20.00    Pack years: 10.00    Types: Cigarettes    Quit date: 2009    Years since quitting: 14.1   Smokeless tobacco: Never  Vaping Use   Vaping Use: Never used  Substance Use Topics   Alcohol use: Yes    Comment: 07/22/2017 "glass of wine maybe once/month"   Drug use: No   Social History   Social History Narrative   Not on file   Family History  Problem Relation Age of Onset   Diabetes Mother    Hypertension Mother    Heart failure Mother    Cataracts Mother    Diabetes Father    Coronary artery disease Father    Retinitis pigmentosa Sister    Amblyopia Neg Hx    Blindness Neg Hx    Glaucoma Neg Hx    Macular degeneration Neg Hx    Retinal detachment Neg Hx    Strabismus Neg Hx     OBJCTIVE -PE, EKG, labs   Wt Readings from Last 3 Encounters:  02/01/22 185 lb (83.9 kg)  01/02/22 192 lb 6.4 oz (87.3 kg)  08/03/21 179 lb (81.2 kg)    Physical Exam: There were no vitals taken for this visit. Physical Exam   Adult ECG Report  Rate: *** ;  Rhythm: {rhythm:17366};   Narrative Interpretation: ***  Recent Labs:  ***  Lab Results  Component Value Date   CHOL 169 01/19/2017   HDL 52 01/19/2017   LDLCALC 93 01/19/2017   TRIG 120 01/19/2017   CHOLHDL 3.3 01/19/2017   Lab Results  Component Value Date   CREATININE 1.15 (H) 08/03/2021   BUN 12 08/03/2021   NA 139 08/03/2021   K 3.8 08/03/2021   CL 105 08/03/2021   CO2 25 08/03/2021   CBC Latest Ref Rng & Units 09/02/2017 07/23/2017 07/22/2017  WBC 4.0 - 10.5 K/uL 6.1 12.9(H) 15.4(H)  Hemoglobin 12.0 - 15.0 g/dL 11.1(L) 9.5(L) 9.3(L)  Hematocrit 36.0 - 46.0 % 35.6(L) 31.5(L) 30.3(L)  Platelets 150 - 400 K/uL 343 286 281    Lab Results  Component Value Date   HGBA1C 6.6 (H) 01/19/2017   Lab Results  Component Value Date   TSH 1.103 01/22/2011    ==================================================  COVID-19 Education: The signs and symptoms of COVID-19 were  discussed with the patient and how to seek care for testing (follow up with PCP or arrange E-visit).    I spent a total of *** minutes with the patient spent in direct patient consultation.  Additional time spent with chart review  / charting (studies, outside notes, etc): *** min Total Time: *** min  Current medicines are reviewed at length with the patient today.  (+/- concerns) ***  This visit occurred during the SARS-CoV-2 public health emergency.  Safety protocols were in place, including screening questions prior to the visit, additional usage of staff PPE, and extensive cleaning of exam room while observing appropriate contact time as indicated for disinfecting solutions.  Notice: This dictation was prepared with  Sales executive along with Engineer, materials. Any transcriptional errors that result from this process are unintentional and may not be corrected upon review.   Studies Ordered:  No orders of the defined types were placed in this encounter.   Patient Instructions / Medication Changes & Studies & Tests Ordered   There are no Patient Instructions on file for this visit.    Glenetta Hew, M.D., M.S. Interventional Cardiologist   Pager # [phone] Phone # [phone] [address]. [place], [place] [address]   Thank you for choosing Heartcare at [facility]!!

## 2022-02-08 ENCOUNTER — Telehealth: Payer: Self-pay | Admitting: Pulmonary Disease

## 2022-02-08 NOTE — Telephone Encounter (Signed)
Call patient  Sleep study result  Date of study: 02/01/2022  Impression: Suboptimal study for the diagnosis of sleep disordered breathing Sleep onset and sleep maintenance insomnia  Study was limited by limited amount of sleep  Recommendation:  Follow-up for further evaluation and management of insomnia  Optimize sleep hygiene  Encourage weight loss efforts

## 2022-02-08 NOTE — Procedures (Signed)
POLYSOMNOGRAPHY  Last, First: Dawn Rice, Dawn Rice MRN: [id number] Gender: Female Age (years): 66 Weight (lbs): 185 DOB: 06/09/1956 BMI: 34 Primary Care: No PCP Epworth Score: 6 Referring: Tomma Lightning MD Technician: Cherylann Parr Interpreting: Tomma Lightning MD Study Type: NPSG Ordered Study Type: NPSG Study date: 02/01/2022 Location: [place] CLINICAL INFORMATION Michaelle Bottomley is a 66 year old Female and was referred to the sleep center for evaluation of N/A. Indications include Fatigue, Snoring, Witnesses Apnea / Gasping During Sleep.  MEDICATIONS Patient self administered medications include: N/A. Medications administered during study include No sleep medicine administered.  SLEEP STUDY TECHNIQUE A multi-channel overnight Polysomnography study was performed. The channels recorded and monitored were central and occipital EEG, electrooculogram (EOG), submentalis EMG (chin), nasal and oral airflow, thoracic and abdominal wall motion, anterior tibialis EMG, snore microphone, electrocardiogram, and a pulse oximetry. TECHNICIAN COMMENTS Comments added by Technician: Patient had difficulty initiating sleep. Patient was restless all through the night. Comments added by Scorer: N/A SLEEP ARCHITECTURE The study was initiated at 9:47:41 PM and terminated at 4:43:17 AM. The total recorded time was 415.6 minutes. EEG confirmed total sleep time was 103.5 minutes yielding a sleep efficiency of 24.9%%. Sleep onset after lights out was 180.1 minutes with a REM latency of N/A minutes. The patient spent 1.4%% of the night in stage N1 sleep, 97.6%% in stage N2 sleep, 1.0%% in stage N3 and 0% in REM. Wake after sleep onset (WASO) was 132.0 minutes. The Arousal Index was 8.7/hour. RESPIRATORY PARAMETERS There were a total of 31 respiratory disturbances out of which 1 were apneas ( 1 obstructive, 0 mixed, 0 central) and 30 hypopneas. The apnea/hypopnea index (AHI) was 18.0 events/hour. The  central sleep apnea index was 0 events/hour. The REM AHI was N/A events/hour and NREM AHI was 18.0 events/hour. The supine AHI was 13.3 events/hour and the non supine AHI was 23 supine during 52.19% of sleep. Respiratory disturbances were associated with oxygen desaturation down to a nadir of 89.0% during sleep. The mean oxygen saturation during the study was 94.2%. The cumulative time under 88% oxygen saturation was 5.5 minutes.  LEG MOVEMENT DATA The total leg movements were 139 with a resulting leg movement index of 80.6/hr .Associated arousal with leg movement index was 2.3/hr.  CARDIAC DATA The underlying cardiac rhythm was most consistent with sinus rhythm. Mean heart rate during sleep was 69.4 bpm. Additional rhythm abnormalities include None.  IMPRESSIONS - Suboptimal study with less than two hours of sleep recorded - Sleep onset and sleep maintenance insomnia - Moderate Obstructive Sleep apnea(OSA) - EKG showed no cardiac abnormalities. - Mild Oxygen Desaturation - No snoring was audible during this study. - No significant periodic leg movements(PLMs) during sleep. However, no significant associated arousals.  DIAGNOSIS - Sleep onset and sleep maintenance insomnia - Daytime sleepiness - Very mild nocturnal Hypoxemia (G47.36) - Limited data to grade sleep disordered breathing.  RECOMMENDATIONS - Follow-up for further evaluation and management of insomnia. - Avoid alcohol, sedatives and other CNS depressants that may worsen sleep apnea and disrupt normal sleep architecture. - Sleep hygiene should be reviewed to assess factors that may improve sleep quality. - Weight management and regular exercise should be initiated or continued.  [Electronically signed] 02/08/2022 09:26 AM  Virl Diamond MD NPI: [id number]

## 2022-02-09 ENCOUNTER — Ambulatory Visit: Payer: Medicare Other | Admitting: Cardiology

## 2022-02-09 NOTE — Telephone Encounter (Signed)
I called the patient and gave her the results. She has a follow up for 04/02/22. She did not have any questions.

## 2022-02-25 NOTE — Progress Notes (Deleted)
?  ?Cardiology Office Note ? ? ?Date:  02/25/2022  ? ?ID:  Dawn Rice, DOB [birth year], MRN [id number] ? ?PCP:  Ralene Ok, MD  ?Cardiologist:   None ? ? ?No chief complaint on file. ? ? ?  ?History of Present Illness: ?Dawn Rice is a 66 y.o. female who presents for follow-up of ?  ?She was seen by Dr. Purvis Sheffield in 2018.   She had chest pain and she had no ischemia on Lexiscan Myoview and her echo was normal.  I was able to review some recent records from her primary provider.  She has had hypertension and diabetes managed.  When she last saw her primary provider she was complaining of some chest tightness.  He had some shortness of breath.  She did see pulmonary and sleep study and pulmonary function tests. ? ? ? ?Past Medical History:  ?Diagnosis Date  ? Cataract   ? Epilepsy (HCC)   ? Gout   ? History of blood transfusion   ? "when I got my hysterectomy" (07/22/2017)  ? Hyperlipidemia   ? Hypertension   ? Seizures (HCC)   ? "from epilepsy; the kind where I space out; last one was ~ 1 yr ago" (07/22/2017)  ? Type II diabetes mellitus (HCC)   ? ? ?Past Surgical History:  ?Procedure Laterality Date  ? ABDOMINAL HYSTERECTOMY    ? APPENDECTOMY    ? CATARACT EXTRACTION    ? CATARACT EXTRACTION W/ INTRAOCULAR LENS  IMPLANT, BILATERAL Bilateral 2016-2017  ? right-left  ? CHOLECYSTECTOMY  03/2009  ? lap/notes 04/25/2011  ? LAPAROSCOPIC APPENDECTOMY N/A 07/21/2017  ? Procedure: APPENDECTOMY LAPAROSCOPIC;  Surgeon: Manus Rudd, MD;  Location: [facility] OR;  Service: General;  Laterality: N/A;  ? TUBAL LIGATION    ? ? ? ?Current Outpatient Medications  ?Medication Sig Dispense Refill  ? allopurinol (ZYLOPRIM) 100 MG tablet Take 100 mg by mouth daily.    ? amLODipine (NORVASC) 2.5 MG tablet Take 2 tablets (5 mg total) by mouth daily. 30 tablet 0  ? atorvastatin (LIPITOR) 80 MG tablet Take 80 mg by mouth daily.  2  ? Biotin 5000 MCG CAPS Take by mouth 2 (two) times a day.    ? carbamazepine (CARBATROL) 200 MG 12 hr  capsule Take 200 mg by mouth daily.  3  ? cetirizine (ZYRTEC ALLERGY) 10 MG tablet Take 1 tablet (10 mg total) by mouth daily. 30 tablet 3  ? Cyanocobalamin (VITAMIN B12) 500 MCG TABS Take by mouth.    ? cyclobenzaprine (FLEXERIL) 10 MG tablet Take 1 tablet (10 mg total) by mouth 2 (two) times daily as needed for muscle spasms. 20 tablet 0  ? dapagliflozin propanediol (FARXIGA) 5 MG TABS tablet Take 5 mg by mouth daily.    ? GNP GARLIC EXTRACT PO Take by mouth. 1000 per day per patient    ? hydrOXYzine (ATARAX/VISTARIL) 10 MG tablet Take 10-30mg  1 hour before bedtime as needed for itching 90 tablet 1  ? lisinopril (PRINIVIL,ZESTRIL) 40 MG tablet Take 40 mg by mouth daily.  3  ? loratadine (CLARITIN) 10 MG tablet Take 1 tablet (10 mg total) by mouth daily. One po daily x 5 days 20 tablet 0  ? methimazole (TAPAZOLE) 5 MG tablet TK 1 T PO BID  0  ? metoprolol tartrate (LOPRESSOR) 25 MG tablet Take 25 mg by mouth 2 (two) times daily.    ? metroNIDAZOLE (FLAGYL) 500 MG tablet Take 1 tablet (500 mg total) by mouth every  8 (eight) hours. 15 tablet 0  ? Multiple Vitamin (MULTIVITAMIN) capsule Take 1 capsule by mouth daily.    ? ONETOUCH VERIO test strip U UTD  2  ? rosuvastatin (CRESTOR) 10 MG tablet TK 1 T PO QD  3  ? SYNJARDY 12.04-999 MG TABS TK 1 T PO BID  1  ? traMADol (ULTRAM) 50 MG tablet Take 1 tablet (50 mg total) by mouth every 6 (six) hours as needed (mild pain). 20 tablet 0  ? triamcinolone cream (KENALOG) 0.1 % Apply 1 application topically 2 (two) times daily. As needed for up to 3 weeks at a time. Do not use on the face or neck. 80 g 1  ? ?No current facility-administered medications for this visit.  ? ? ?Allergies:   Contrave [naltrexone-bupropion hcl er], Metformin and related, and Penicillins  ? ? ?Social History:  The patient  reports that she quit smoking about 14 years ago. Her smoking use included cigarettes. She has a 10.00 pack-year smoking history. She has never used smokeless tobacco. She reports  current alcohol use. She reports that she does not use drugs.  ? ?Family History:  The patient's ***family history includes Cataracts in her mother; Coronary artery disease in her father; Diabetes in her father and mother; Heart failure in her mother; Hypertension in her mother; Retinitis pigmentosa in her sister.  ? ? ?ROS:  Please see the history of present illness.   Otherwise, review of systems are positive for {NONE DEFAULTED:18576}.   All other systems are reviewed and negative.  ? ? ?PHYSICAL EXAM: ?VS:  There were no vitals taken for this visit. , BMI There is no height or weight on file to calculate BMI. ?GENERAL:  Well appearing ?HEENT:  Pupils equal round and reactive, fundi not visualized, oral mucosa unremarkable ?NECK:  No jugular venous distention, waveform within normal limits, carotid upstroke brisk and symmetric, no bruits, no thyromegaly ?LYMPHATICS:  No cervical, inguinal adenopathy ?LUNGS:  Clear to auscultation bilaterally ?BACK:  No CVA tenderness ?CHEST:  Unremarkable ?HEART:  PMI not displaced or sustained,S1 and S2 within normal limits, no S3, no S4, no clicks, no rubs, *** murmurs ?ABD:  Flat, positive bowel sounds normal in frequency in pitch, no bruits, no rebound, no guarding, no midline pulsatile mass, no hepatomegaly, no splenomegaly ?EXT:  2 plus pulses throughout, no edema, no cyanosis no clubbing ?SKIN:  No rashes no nodules ?NEURO:  Cranial nerves II through XII grossly intact, motor grossly intact throughout ?PSYCH:  Cognitively intact, oriented to person place and time ? ? ? ?EKG:  EKG {ACTION; IS/IS JJH:[id number]} ordered today. ?The ekg ordered today demonstrates *** ? ? ?Recent Labs: ?08/03/2021: ALT 17; B Natriuretic Peptide 9.7; BUN 12; Creatinine, Ser 1.15; Potassium 3.8; Sodium 139  ? ? ?Lipid Panel ?   ?Component Value Date/Time  ? CHOL 169 01/19/2017 1002  ? TRIG 120 01/19/2017 1002  ? HDL 52 01/19/2017 1002  ? CHOLHDL 3.3 01/19/2017 1002  ? VLDL 24 01/19/2017 1002  ?  LDLCALC 93 01/19/2017 1002  ? ?  ? ?Wt Readings from Last 3 Encounters:  ?02/01/22 185 lb (83.9 kg)  ?01/02/22 192 lb 6.4 oz (87.3 kg)  ?08/03/21 179 lb (81.2 kg)  ?  ? ? ?Other studies Reviewed: ?Additional studies/ records that were reviewed today include: ***. ?Review of the above records demonstrates:  Please see elsewhere in the note.  *** ? ? ?ASSESSMENT AND PLAN: ? ?Chest pain:  *** ? ?HTN:  *** ? ?  Dyslipidemia:  ***  ? ?Current medicines are reviewed at length with the patient today.  The patient {ACTIONS; HAS/DOES NOT HAVE:19233} concerns regarding medicines. ? ?The following changes have been made:  {PLAN; NO CHANGE:13088:s} ? ?Labs/ tests ordered today include: *** ?No orders of the defined types were placed in this encounter. ? ? ? ?Disposition:   FU with ***  ? ? ?Signed, ?Rollene Rotunda, MD  ?02/25/2022 12:20 PM    ?[facility] ? ? ? ?

## 2022-02-26 ENCOUNTER — Telehealth (HOSPITAL_BASED_OUTPATIENT_CLINIC_OR_DEPARTMENT_OTHER): Payer: Self-pay | Admitting: *Deleted

## 2022-02-26 ENCOUNTER — Ambulatory Visit: Payer: Medicare Other | Admitting: Cardiology

## 2022-02-26 NOTE — Telephone Encounter (Signed)
Patient was scheduled to see Dr Antoine Poche today  ?When she did not show up I called patient to follow up, she stated she thought her appointment was at 3 today ?Explained per Dr Antoine Poche if she is a no show again she will not be able to reschedule with practice, verbalized understanding  ?

## 2022-03-15 ENCOUNTER — Other Ambulatory Visit: Payer: Self-pay

## 2022-03-15 ENCOUNTER — Ambulatory Visit: Payer: Medicare Other | Admitting: Cardiology

## 2022-03-15 ENCOUNTER — Encounter: Payer: Self-pay | Admitting: Cardiology

## 2022-03-15 ENCOUNTER — Ambulatory Visit: Payer: Medicare Other

## 2022-03-15 VITALS — BP 142/82 | HR 78 | Ht 62.0 in | Wt 192.0 lb

## 2022-03-15 DIAGNOSIS — R002 Palpitations: Secondary | ICD-10-CM

## 2022-03-15 DIAGNOSIS — E785 Hyperlipidemia, unspecified: Secondary | ICD-10-CM

## 2022-03-15 DIAGNOSIS — I10 Essential (primary) hypertension: Secondary | ICD-10-CM | POA: Diagnosis not present

## 2022-03-15 DIAGNOSIS — E118 Type 2 diabetes mellitus with unspecified complications: Secondary | ICD-10-CM

## 2022-03-15 DIAGNOSIS — R072 Precordial pain: Secondary | ICD-10-CM | POA: Diagnosis not present

## 2022-03-15 MED ORDER — METOPROLOL TARTRATE 50 MG PO TABS: 50.0000 mg | ORAL_TABLET | Freq: Once | ORAL | 0 refills | Status: DC

## 2022-03-15 NOTE — Progress Notes (Signed)
?Cardiology Office Note:   ? ?Date:  03/16/2022  ? ?ID:  Dawn Rice, DOB 01/01/1956, MRN [id number] ? ?PCP:  Ralene Ok, MD  ?Cardiologist:  Thomasene Ripple, DO  ?Electrophysiologist:  None  ? ?Referring MD: Ralene Ok, MD  ? ?" I am doing ? ? ?History of Present Illness:   ? ?Dawn Rice is a 66 y.o. female with a hx of hypertension, hyperlipidemia, type 2 diabetes, seizures here today to be evaluated for elevated blood pressure as well as palpitations. ? ?The patient tells me that she has been seen for intermittent chest discomfort.  She notes that it is midsternal.  It comes off and on.  Nothing makes it better or worse.  She quantifies it probably about a 5 out of 10.  She admits to associated shortness of breath.  No radiation.  In addition she tells me that she is experiencing increasing palpitations. ? ?Based on chart review she was seen in 2018 she had a nuclear stress test which was normal. ? ?Past Medical History:  ?Diagnosis Date  ? Cataract   ? Epilepsy (HCC)   ? Gout   ? History of blood transfusion   ? "when I got my hysterectomy" (07/22/2017)  ? Hyperlipidemia   ? Hypertension   ? Seizures (HCC)   ? "from epilepsy; the kind where I space out; last one was ~ 1 yr ago" (07/22/2017)  ? Type II diabetes mellitus (HCC)   ? ? ?Past Surgical History:  ?Procedure Laterality Date  ? ABDOMINAL HYSTERECTOMY    ? APPENDECTOMY    ? CATARACT EXTRACTION    ? CATARACT EXTRACTION W/ INTRAOCULAR LENS  IMPLANT, BILATERAL Bilateral 2016-2017  ? right-left  ? CHOLECYSTECTOMY  03/2009  ? lap/notes 04/25/2011  ? LAPAROSCOPIC APPENDECTOMY N/A 07/21/2017  ? Procedure: APPENDECTOMY LAPAROSCOPIC;  Surgeon: Manus Rudd, MD;  Location: [facility] OR;  Service: General;  Laterality: N/A;  ? TUBAL LIGATION    ? ? ?Current Medications: ?Current Meds  ?Medication Sig  ? amLODipine (NORVASC) 2.5 MG tablet Take 2 tablets (5 mg total) by mouth daily. (Patient taking differently: Take 2.5 mg by mouth daily.)  ? BELSOMRA 20 MG TABS  Take 1 tablet by mouth at bedtime as needed.  ? carbamazepine (CARBATROL) 200 MG 12 hr capsule Take 200 mg by mouth daily.  ? chlorthalidone (HYGROTON) 25 MG tablet Take 25 mg by mouth daily.  ? lisinopril (PRINIVIL,ZESTRIL) 40 MG tablet Take 40 mg by mouth daily.  ? metoprolol tartrate (LOPRESSOR) 50 MG tablet Take 1 tablet (50 mg total) by mouth once for 1 dose. PLEASE TAKE METOPROLOL 2  HOURS PRIOR TO CTA SCAN.  ? ONETOUCH VERIO test strip U UTD  ? rosuvastatin (CRESTOR) 10 MG tablet TK 1 T PO QD  ? SYNJARDY 12.04-999 MG TABS TK 1 T PO BID  ? triamcinolone cream (KENALOG) 0.1 % Apply 1 application topically 2 (two) times daily. As needed for up to 3 weeks at a time. Do not use on the face or neck.  ?  ? ?Allergies:   Contrave [naltrexone-bupropion hcl er], Metformin and related, and Penicillins  ? ?Social History  ? ?Socioeconomic History  ? Marital status: Widowed  ?  Spouse name: Not on file  ? Number of children: Not on file  ? Years of education: Not on file  ? Highest education level: Not on file  ?Occupational History  ? Not on file  ?Tobacco Use  ? Smoking status: Former  ?  Packs/day:  0.50  ?  Years: 20.00  ?  Pack years: 10.00  ?  Types: Cigarettes  ?  Quit date: 2009  ?  Years since quitting: 14.2  ? Smokeless tobacco: Never  ?Vaping Use  ? Vaping Use: Never used  ?Substance and Sexual Activity  ? Alcohol use: Yes  ?  Comment: 07/22/2017 "glass of wine maybe once/month"  ? Drug use: No  ? Sexual activity: Not on file  ?Other Topics Concern  ? Not on file  ?Social History Narrative  ? Widowed mother of 4.  ? Quit smoking in 2008.  ? Occasional alcohol.  ? ?Social Determinants of Health  ? ?Financial Resource Strain: Not on file  ?Food Insecurity: Not on file  ?Transportation Needs: Not on file  ?Physical Activity: Not on file  ?Stress: Not on file  ?Social Connections: Not on file  ?  ? ?Family History: ?The patient's family history includes Cataracts in her mother; Coronary artery disease in her father;  Diabetes in her father and mother; Heart failure in her mother; Hypertension in her mother; Retinitis pigmentosa in her sister. There is no history of Amblyopia, Blindness, Glaucoma, Macular degeneration, Retinal detachment, or Strabismus. ? ?ROS:   ?Review of Systems  ?Constitution: Negative for decreased appetite, fever and weight gain.  ?HENT: Negative for congestion, ear discharge, hoarse voice and sore throat.   ?Eyes: Negative for discharge, redness, vision loss in right eye and visual halos.  ?Cardiovascular: Negative for chest pain, dyspnea on exertion, leg swelling, orthopnea and palpitations.  ?Respiratory: Negative for cough, hemoptysis, shortness of breath and snoring.   ?Endocrine: Negative for heat intolerance and polyphagia.  ?Hematologic/Lymphatic: Negative for bleeding problem. Does not bruise/bleed easily.  ?Skin: Negative for flushing, nail changes, rash and suspicious lesions.  ?Musculoskeletal: Negative for arthritis, joint pain, muscle cramps, myalgias, neck pain and stiffness.  ?Gastrointestinal: Negative for abdominal pain, bowel incontinence, diarrhea and excessive appetite.  ?Genitourinary: Negative for decreased libido, genital sores and incomplete emptying.  ?Neurological: Negative for brief paralysis, focal weakness, headaches and loss of balance.  ?Psychiatric/Behavioral: Negative for altered mental status, depression and suicidal ideas.  ?Allergic/Immunologic: Negative for HIV exposure and persistent infections.  ? ? ?EKGs/Labs/Other Studies Reviewed:   ? ?The following studies were reviewed today: ? ? ?EKG:  The ekg ordered today demonstrates sinus rhythm, heart rate 78 bpm. ? ?Lexiscan 2018 ?FINDINGS: ?Perfusion: No decreased activity in the left ventricle on stress ?imaging to suggest reversible ischemia or infarction. ?  ?Wall Motion: Normal left ventricular wall motion. No left ?ventricular dilation. ?  ?Left Ventricular Ejection Fraction: 57 % ?  ?End diastolic volume 77 ml ?   ?End systolic volume 33 ml ?  ?IMPRESSION: ?1. No reversible ischemia or infarction. ?  ?2. Normal left ventricular wall motion. ?  ?3. Left ventricular ejection fraction is 57%. ?  ?4. Non invasive risk stratification*: Low ? ?Recent Labs: ?08/03/2021: ALT 17; B Natriuretic Peptide 9.7; BUN 12; Creatinine, Ser 1.15; Potassium 3.8; Sodium 139  ?Recent Lipid Panel ?   ?Component Value Date/Time  ? CHOL 169 01/19/2017 1002  ? TRIG 120 01/19/2017 1002  ? HDL 52 01/19/2017 1002  ? CHOLHDL 3.3 01/19/2017 1002  ? VLDL 24 01/19/2017 1002  ? LDLCALC 93 01/19/2017 1002  ? ? ?Physical Exam:   ? ?VS:  BP (!) 142/82 (BP Location: Right Arm, Patient Position: Sitting, Cuff Size: Normal)   Pulse 78   Ht 5\' 2"  (1.575 m)   Wt 192 lb (87.1 kg)  SpO2 97%   BMI 35.12 kg/m?    ? ?Wt Readings from Last 3 Encounters:  ?03/15/22 192 lb (87.1 kg)  ?02/01/22 185 lb (83.9 kg)  ?01/02/22 192 lb 6.4 oz (87.3 kg)  ?  ? ?GEN: Well nourished, well developed in no acute distress ?HEENT: Normal ?NECK: No JVD; No carotid bruits ?LYMPHATICS: No lymphadenopathy ?CARDIAC: S1S2 noted,RRR, no murmurs, rubs, gallops ?RESPIRATORY:  Clear to auscultation without rales, wheezing or rhonchi  ?ABDOMEN: Soft, non-tender, non-distended, +bowel sounds, no guarding. ?EXTREMITIES: No edema, No cyanosis, no clubbing ?MUSCULOSKELETAL:  No deformity  ?SKIN: Warm and dry ?NEUROLOGIC:  Alert and oriented x 3, non-focal ?PSYCHIATRIC:  Normal affect, good insight ? ?ASSESSMENT:   ? ?1. Precordial pain   ?2. Type 2 diabetes mellitus with complication, without long-term current use of insulin (HCC)   ?3. Palpitations   ?4. Hyperlipidemia, unspecified hyperlipidemia type   ?5. Hypertension, unspecified type   ? ?PLAN:   ? ? ?The symptoms chest pain is concerning, this patient does have intermediate risk for coronary artery disease and at this time I would like to pursue an ischemic evaluation in this patient.  Shared decision a coronary CTA at this time is appropriate.   I have discussed with the patient about the testing.  The patient has no IV contrast allergy and is agreeable to proceed with this test. ? ?I would like to rule out a cardiovascular etiology of this palpitatio

## 2022-03-15 NOTE — Progress Notes (Unsigned)
Enrolled for [employer] to mail a ZIO XT long term holter monitor to the patients address on file.  

## 2022-03-15 NOTE — Patient Instructions (Addendum)
Medication Instructions:  ?PLEASE TAKE METOPROLOL TARTRATE 50mg  TWO HOURS PRIOR TO CCTA SCAN  ?*If you need a refill on your cardiac medications before your next appointment, please call your pharmacy* ? ?Lab Work: ?Please return for Blood Work 1 WEEK PRIOR  No appointment needed, lab here at the office is open Monday-Friday from 8AM to 4PM and closed daily for lunch from 12:45-1:45.  ? ?If you have labs (blood work) drawn today and your tests are completely normal, you will receive your results only by: ?MyChart Message (if you have MyChart) OR ?A paper copy in the mail ?If you have any lab test that is abnormal or we need to change your treatment, we will call you to review the results. ? ?Testing/Procedures: ?Your physician has requested that you have an echocardiogram. Echocardiography is a painless test that uses sound waves to create images of your heart. It provides your doctor with information about the size and shape of your heart and how well your heart?s chambers and valves are working. You may receive an ultrasound enhancing agent through an IV if needed to better visualize your heart during the echo.This procedure takes approximately one hour. There are no restrictions for this procedure. This will take place at the [address].  ? ?Your physician has requested that you have cardiac CT. Cardiac computed tomography (CT) is a painless test that uses an x-ray machine to take clear, detailed pictures of your heart. For further information please visit [address]. Please follow instruction sheet as given. ?  ? ?Follow-Up: ?At [facility], you and your health needs are our priority.  As part of our continuing mission to provide you with exceptional heart care, we have created designated Provider Care Teams.  These Care Teams include your primary Cardiologist (physician) and Advanced Practice Providers (APPs -  Physician Assistants and Nurse Practitioners) who all work together to  provide you with the care you need, when you need it. ? ?We recommend signing up for the patient portal called "MyChart".  Sign up information is provided on this After Visit Summary.  MyChart is used to connect with patients for Virtual Visits (Telemedicine).  Patients are able to view lab/test results, encounter notes, upcoming appointments, etc.  Non-urgent messages can be sent to your provider as well.   ?To learn more about what you can do with MyChart, go to [facility].   ? ?Your next appointment:   ?6 month(s) ? ?The format for your next appointment:   ?In Person ? ?Provider:   ?[email or web address], [email or web address]   ? ?Other Instructions ? ?ZIO XT- Long Term Monitor Instructions  ? ?Your physician has requested you wear your ZIO patch monitor____14___days.  ? ?This is a single patch monitor.  [employer] supplies one patch monitor per enrollment.  Additional stickers are not available. ?  ?Please do not apply patch if you will be having a Nuclear Stress Test, Echocardiogram, Cardiac CT, MRI, or Chest Xray during the time frame you would be wearing the monitor. The patch cannot be worn during these tests.  You cannot remove and re-apply the ZIO XT patch monitor. ?  ?Your ZIO patch monitor will be sent USPS Priority mail from [facility] directly to your home address. The monitor may also be mailed to a PO BOX if home delivery is not available.   It may take 3-5 days to receive your monitor after you have been enrolled. ?  ?Once you have received you monitor, please review enclosed  instructions.  Your monitor has already been registered assigning a specific monitor serial # to you. ?  ?Applying the monitor  ? ?Shave hair from upper left chest. ?  ?Hold abrader disc by orange tab.  Rub abrader in 40 strokes over left upper chest as indicated in your monitor instructions. ?  ?Clean area with 4 enclosed alcohol pads .  Use all pads to assure are is cleaned thoroughly.  Let dry.  ? ?Apply patch as indicated in  monitor instructions.  Patch will be place under collarbone on left side of chest with arrow pointing upward. ?  ?Rub patch adhesive wings for 2 minutes.Remove white label marked "1".  Remove white label marked "2".  Rub patch adhesive wings for 2 additional minutes. ?  ?While looking in a mirror, press and release button in center of patch.  A small green light will flash 3-4 times .  This will be your only indicator the monitor has been turned on. ?    ?Do not shower for the first 24 hours.  You may shower after the first 24 hours. ?  ?Press button if you feel a symptom. You will hear a small click.  Record Date, Time and Symptom in the Patient Log Book. ?  ?When you are ready to remove patch, follow instructions on last 2 pages of Patient Log Book.  Stick patch monitor onto last page of Patient Log Book. ?  ?Place Patient Log Book in [facility] box.  Use locking tab on box and tape box closed securely.  The [employer] has [employer] on it.  Please place in mailbox as soon as possible.  Your physician should have your test results approximately 7 days after the monitor has been mailed back to [facility]. ?  ?Call [facility] at [phone] if you have questions regarding your ZIO XT patch monitor.  Call them immediately if you see an orange light blinking on your monitor. ?  ?If your monitor falls off in less than 4 days contact our Monitor department at [phone].  If your monitor becomes loose or falls off after 4 days call [employer] at [phone] for suggestions on securing your monitor.  ? ? ? ?Your cardiac CT will be scheduled at one of the below locations:  ? ?[facility] ?[address] ?[place], [place] [address] ?[phone] ? ?If scheduled at [facility], please arrive at the [facility] and Children's Entrance (Entrance C2) of [facility] 30 minutes prior to test start time. ?You can use the FREE valet parking offered at entrance C  (encouraged to control the heart rate for the test)  ?Proceed to the [facility] Radiology Department (first floor) to check-in and test prep. ? ?All radiology patients and guests should use entrance C2 at [facility], accessed from [facility], even though the hospital's physical address listed is [address] ? ? ? ?Please follow these instructions carefully (unless otherwise directed): ? ?On the Night Before the Test: ?Be sure to Drink plenty of water. ?Do not consume any caffeinated/decaffeinated beverages or chocolate 12 hours prior to your test. ?Do not take any antihistamines 12 hours prior to your test. ? ?On the Day of the Test: ?Drink plenty of water until 1 hour prior to the test. ?Do not eat any food 4 hours prior to the test. ?You may take your regular medications prior to the test.  ?Take metoprolol (Lopressor) two hours prior to  test. ?HOLD Furosemide/Hydrochlorothiazide morning of the test. ?FEMALES- please wear underwire-free bra if available, avoid dresses & tight clothing ? ?After the Test: ?Drink plenty of water. ?After receiving IV contrast, you may experience a mild flushed feeling. This is normal. ?On occasion, you may experience a mild rash up to 24 hours after the test. This is not dangerous. If this occurs, you can take Benadryl 25 mg and increase your fluid intake. ?If you experience trouble breathing, this can be serious. If it is severe call 911 IMMEDIATELY. If it is mild, please call our office. ?If you take any of these medications: Glipizide/Metformin, Avandament, Glucavance, please do not take 48 hours after completing test unless otherwise instructed. ? ?We will call to schedule your test 2-4 weeks out understanding that some insurance companies will need an authorization prior to the service being performed.  ? ?For non-scheduling related questions, please contact the cardiac imaging nurse navigator should you have any questions/concerns: ?Rockwell AlexandriaSara  Wallace, Cardiac Imaging Nurse Navigator ?Larey BrickMerle Prescott, Cardiac Imaging Nurse Navigator ?[facility] ?Direct Office Dial: [phone]  ? ?For scheduling needs, including cancellations and res

## 2022-03-27 ENCOUNTER — Other Ambulatory Visit (HOSPITAL_COMMUNITY): Payer: Self-pay | Admitting: *Deleted

## 2022-03-27 ENCOUNTER — Ambulatory Visit (HOSPITAL_COMMUNITY): Payer: Medicare Other | Attending: Cardiology

## 2022-03-27 DIAGNOSIS — R072 Precordial pain: Secondary | ICD-10-CM | POA: Diagnosis present

## 2022-03-27 DIAGNOSIS — R002 Palpitations: Secondary | ICD-10-CM | POA: Insufficient documentation

## 2022-03-27 DIAGNOSIS — R0602 Shortness of breath: Secondary | ICD-10-CM | POA: Diagnosis not present

## 2022-03-27 DIAGNOSIS — I10 Essential (primary) hypertension: Secondary | ICD-10-CM | POA: Diagnosis not present

## 2022-03-27 LAB — ECHOCARDIOGRAM COMPLETE
Area-P 1/2: 3.77 cm2
S' Lateral: 2.3 cm

## 2022-03-27 MED ORDER — METOPROLOL TARTRATE 50 MG PO TABS: 50.0000 mg | ORAL_TABLET | Freq: Once | ORAL | 0 refills | Status: DC

## 2022-03-28 ENCOUNTER — Telehealth (HOSPITAL_COMMUNITY): Payer: Self-pay | Admitting: *Deleted

## 2022-03-28 NOTE — Telephone Encounter (Signed)
Reaching out to patient to offer assistance regarding upcoming cardiac imaging study; pt verbalizes understanding of appt date/time, parking situation and where to check in, pre-test NPO status and medications ordered, and verified current allergies; name and call back number provided for further questions should they arise ? ?Paco Cislo RN Navigator Cardiac Imaging ?[facility] ?[phone] office ?[phone] cell ? ?Patient to take 50mg metoprolol tartrate two hours prior to her cardiac CT scan.  She is aware to arrive at 2pm. ?

## 2022-03-29 ENCOUNTER — Ambulatory Visit (HOSPITAL_COMMUNITY)
Admission: RE | Admit: 2022-03-29 | Discharge: 2022-03-29 | Disposition: A | Discharge status: Home / Self Care | Payer: Medicare Other | Source: Ambulatory Visit | Attending: Cardiology | Admitting: Cardiology

## 2022-03-29 ENCOUNTER — Other Ambulatory Visit: Payer: Self-pay | Admitting: Cardiology

## 2022-03-29 DIAGNOSIS — R931 Abnormal findings on diagnostic imaging of heart and coronary circulation: Secondary | ICD-10-CM

## 2022-03-29 DIAGNOSIS — R072 Precordial pain: Secondary | ICD-10-CM

## 2022-03-29 MED ORDER — IOHEXOL 350 MG/ML SOLN
95.0000 mL | Freq: Once | INTRAVENOUS | Status: AC | PRN
  Administered 2022-03-29: 95 mL via INTRAVENOUS

## 2022-03-29 MED ORDER — NITROGLYCERIN 0.4 MG SL SUBL
0.8000 mg | SUBLINGUAL_TABLET | Freq: Once | SUBLINGUAL | Status: AC
  Administered 2022-03-29: 0.8 mg via SUBLINGUAL

## 2022-03-29 MED ORDER — NITROGLYCERIN 0.4 MG SL SUBL
SUBLINGUAL_TABLET | SUBLINGUAL | Status: AC
  Filled 2022-03-29: qty 2

## 2022-03-30 ENCOUNTER — Ambulatory Visit (HOSPITAL_BASED_OUTPATIENT_CLINIC_OR_DEPARTMENT_OTHER)
Admission: RE | Admit: 2022-03-30 | Discharge: 2022-03-30 | Disposition: A | Discharge status: Home / Self Care | Payer: Medicare Other | Source: Ambulatory Visit | Attending: Cardiology | Admitting: Cardiology

## 2022-03-30 ENCOUNTER — Ambulatory Visit (HOSPITAL_COMMUNITY)
Admission: RE | Admit: 2022-03-30 | Discharge: 2022-03-30 | Disposition: A | Discharge status: Home / Self Care | Payer: Medicare Other | Source: Ambulatory Visit | Attending: Cardiology | Admitting: Cardiology

## 2022-03-30 DIAGNOSIS — R931 Abnormal findings on diagnostic imaging of heart and coronary circulation: Secondary | ICD-10-CM

## 2022-04-02 ENCOUNTER — Ambulatory Visit: Payer: Medicare Other | Admitting: Pulmonary Disease

## 2022-04-04 ENCOUNTER — Other Ambulatory Visit: Payer: Self-pay

## 2022-04-04 MED ORDER — ROSUVASTATIN CALCIUM 20 MG PO TABS: 20.0000 mg | ORAL_TABLET | Freq: Every day | ORAL | 3 refills | Status: DC

## 2022-04-04 MED ORDER — ASPIRIN EC 81 MG PO TBEC: 81.0000 mg | DELAYED_RELEASE_TABLET | Freq: Every day | ORAL | 3 refills | Status: DC

## 2022-04-04 NOTE — Progress Notes (Signed)
Prescription sent to pharmacy.

## 2022-04-10 ENCOUNTER — Ambulatory Visit: Payer: Medicare Other | Admitting: Cardiology

## 2022-05-23 ENCOUNTER — Ambulatory Visit: Payer: Medicare Other | Admitting: Podiatry

## 2022-06-11 ENCOUNTER — Ambulatory Visit (INDEPENDENT_AMBULATORY_CARE_PROVIDER_SITE_OTHER): Payer: Medicare Other | Admitting: Podiatry

## 2022-06-11 ENCOUNTER — Encounter: Payer: Self-pay | Admitting: Podiatry

## 2022-06-11 DIAGNOSIS — B351 Tinea unguium: Secondary | ICD-10-CM

## 2022-06-11 DIAGNOSIS — M79675 Pain in left toe(s): Secondary | ICD-10-CM

## 2022-06-11 DIAGNOSIS — M79674 Pain in right toe(s): Secondary | ICD-10-CM | POA: Diagnosis not present

## 2022-06-11 DIAGNOSIS — E119 Type 2 diabetes mellitus without complications: Secondary | ICD-10-CM

## 2022-06-11 NOTE — Progress Notes (Signed)
This patient returns to my office for at risk foot care.  This patient requires this care by a professional since this patient will be at risk due to having diabetes.  This patient is unable to cut nails herself since the patient cannot reach her nails.These nails are painful walking and wearing shoes.  This patient presents for at risk foot care today.  General Appearance  Alert, conversant and in no acute stress.  Vascular  Dorsalis pedis and posterior tibial  pulses are palpable  bilaterally.  Capillary return is within normal limits  bilaterally. Temperature is within normal limits  bilaterally.  Neurologic  Senn-Weinstein monofilament wire test within normal limits  bilaterally. Muscle power within normal limits bilaterally.  Nails Thick disfigured discolored nails with subungual debris  from hallux to fifth toes bilaterally. No evidence of bacterial infection or drainage bilaterally.  Orthopedic  No limitations of motion  feet .  No crepitus or effusions noted.  No bony pathology or digital deformities noted.  HAV  B/L.  Skin  normotropic skin with no porokeratosis noted bilaterally.  No signs of infections or ulcers noted.     Onychomycosis  Pain in right toes  Pain in left toes  Consent was obtained for treatment procedures.   Mechanical debridement of nails 1-5  bilaterally performed with a nail nipper.  Filed with dremel without incident.    Return office visit    prn                 Told patient to return for periodic foot care and evaluation due to potential at risk complications.   Helane Gunther DPM

## 2022-09-14 ENCOUNTER — Encounter: Payer: Self-pay | Admitting: Cardiology

## 2022-09-14 ENCOUNTER — Ambulatory Visit: Payer: No Typology Code available for payment source | Attending: Cardiology | Admitting: Cardiology

## 2022-09-14 VITALS — Ht 62.0 in | Wt 191.6 lb

## 2022-09-14 DIAGNOSIS — I10 Essential (primary) hypertension: Secondary | ICD-10-CM

## 2022-09-14 DIAGNOSIS — I2510 Atherosclerotic heart disease of native coronary artery without angina pectoris: Secondary | ICD-10-CM | POA: Diagnosis not present

## 2022-09-14 DIAGNOSIS — E669 Obesity, unspecified: Secondary | ICD-10-CM

## 2022-09-14 DIAGNOSIS — E785 Hyperlipidemia, unspecified: Secondary | ICD-10-CM

## 2022-09-14 DIAGNOSIS — R0609 Other forms of dyspnea: Secondary | ICD-10-CM | POA: Diagnosis not present

## 2022-09-14 MED ORDER — CARVEDILOL 6.25 MG PO TABS: 6.2500 mg | ORAL_TABLET | Freq: Two times a day (BID) | ORAL | 3 refills | Status: DC

## 2022-09-14 NOTE — Patient Instructions (Addendum)
Medication Instructions:  Your physician has recommended you make the following change in your medication:  STOP: Lisinopril START: Coreg 6.25 mg twice daily  Please take your blood pressure daily for 2 weeks and send in a MyChart message or drop it off by the office . Please include heart rates.   HOW TO TAKE YOUR BLOOD PRESSURE: Rest 5 minutes before taking your blood pressure. Don't smoke or drink caffeinated beverages for at least 30 minutes before. Take your blood pressure before (not after) you eat. Sit comfortably with your back supported and both feet on the floor (don't cross your legs). Elevate your arm to heart level on a table or a desk. Use the proper sized cuff. It should fit smoothly and snugly around your bare upper arm. There should be enough room to slip a fingertip under the cuff. The bottom edge of the cuff should be 1 inch above the crease of the elbow. Ideally, take 3 measurements at one sitting and record the average.  *If you need a refill on your cardiac medications before your next appointment, please call your pharmacy*   Lab Work: None    Testing/Procedures: None   Follow-Up: At [facility], you and your health needs are our priority.  As part of our continuing mission to provide you with exceptional heart care, we have created designated Provider Care Teams.  These Care Teams include your primary Cardiologist (physician) and Advanced Practice Providers (APPs -  Physician Assistants and Nurse Practitioners) who all work together to provide you with the care you need, when you need it.  We recommend signing up for the patient portal called "MyChart".  Sign up information is provided on this After Visit Summary.  MyChart is used to connect with patients for Virtual Visits (Telemedicine).  Patients are able to view lab/test results, encounter notes, upcoming appointments, etc.  Non-urgent messages can be sent to your provider as well.   To learn  more about what you can do with MyChart, go to [email or web address].    Your next appointment:   10-12 week(s)  The format for your next appointment:   In Person  Provider:   Berniece Salines, DO     Other Instructions   Important Information About Sugar

## 2022-09-14 NOTE — Progress Notes (Unsigned)
Cardiology Office Note:    Date:  09/16/2022   ID:  Dawn Rice, DOB Mar 03, 1956, MRN [id number]  PCP:  Ralene Ok, MD  Cardiologist:  Thomasene Ripple, DO  Electrophysiologist:  None   Referring MD: Ralene Ok, MD   " I am doing   History of Present Illness:    Dawn Rice is a 66 y.o. female with a hx of hypertension, hyperlipidemia, type 2 diabetes, seizures here today to be evaluated for elevated blood pressure as well as palpitations.  The patient tells me that she has been seen for intermittent chest discomfort.  She notes that it is midsternal.  It comes off and on.  Nothing makes it better or worse.  She quantifies it probably about a 5 out of 10.  She admits to associated shortness of breath.  No radiation.  In addition she tells me that she is experiencing increasing palpitations.  Based on chart review she was seen in 2018 she had a nuclear stress test which was normal. I saw the patient on 03/15/2022 she was still experiencing chest pain and shortness of breath therefore sent her for a CCTA. I also placed a monitor on her and got an echo.   Today she tells me that she feels the shortness of breath is getting worse.  She worse on minimal exertion .     Past Medical History:  Diagnosis Date   Cataract    Epilepsy (HCC)    Gout    History of blood transfusion    "when I got my hysterectomy" (07/22/2017)   Hyperlipidemia    Hypertension    Seizures (HCC)    "from epilepsy; the kind where I space out; last one was ~ 1 yr ago" (07/22/2017)   Type II diabetes mellitus (HCC)     Past Surgical History:  Procedure Laterality Date   ABDOMINAL HYSTERECTOMY     APPENDECTOMY     CATARACT EXTRACTION     CATARACT EXTRACTION W/ INTRAOCULAR LENS  IMPLANT, BILATERAL Bilateral 2016-2017   right-left   CHOLECYSTECTOMY  03/2009   lap/notes 04/25/2011   LAPAROSCOPIC APPENDECTOMY N/A 07/21/2017   Procedure: APPENDECTOMY LAPAROSCOPIC;  Surgeon: Manus Rudd, MD;   Location: [facility] OR;  Service: General;  Laterality: N/A;   TUBAL LIGATION      Current Medications: Current Meds  Medication Sig   amLODipine (NORVASC) 2.5 MG tablet Take 2 tablets (5 mg total) by mouth daily. (Patient taking differently: Take 2.5 mg by mouth daily.)   aspirin EC 81 MG tablet Take 1 tablet (81 mg total) by mouth daily. Swallow whole.   BELSOMRA 20 MG TABS Take 1 tablet by mouth at bedtime as needed.   carbamazepine (CARBATROL) 200 MG 12 hr capsule Take 200 mg by mouth daily.   carvedilol (COREG) 6.25 MG tablet Take 1 tablet (6.25 mg total) by mouth 2 (two) times daily.   ONETOUCH VERIO test strip U UTD   rosuvastatin (CRESTOR) 20 MG tablet Take 1 tablet (20 mg total) by mouth daily.   SYNJARDY 12.04-999 MG TABS TK 1 T PO BID   triamcinolone cream (KENALOG) 0.1 % Apply 1 application topically 2 (two) times daily. As needed for up to 3 weeks at a time. Do not use on the face or neck.   [DISCONTINUED] lisinopril (PRINIVIL,ZESTRIL) 40 MG tablet Take 40 mg by mouth daily.     Allergies:   Contrave [naltrexone-bupropion hcl er], Metformin and related, and Penicillins   Social History   Socioeconomic  History   Marital status: Widowed    Spouse name: Not on file   Number of children: Not on file   Years of education: Not on file   Highest education level: Not on file  Occupational History   Not on file  Tobacco Use   Smoking status: Former    Packs/day: 0.50    Years: 20.00    Total pack years: 10.00    Types: Cigarettes    Quit date: 2009    Years since quitting: 14.7   Smokeless tobacco: Never  Vaping Use   Vaping Use: Never used  Substance and Sexual Activity   Alcohol use: Yes    Comment: 07/22/2017 "glass of wine maybe once/month"   Drug use: No   Sexual activity: Not on file  Other Topics Concern   Not on file  Social History Narrative   Widowed mother of 4.   Quit smoking in 2008.   Occasional alcohol.   Social Determinants of Health   Financial  Resource Strain: Not on file  Food Insecurity: Not on file  Transportation Needs: Not on file  Physical Activity: Not on file  Stress: Not on file  Social Connections: Not on file     Family History: The patient's family history includes Cataracts in her mother; Coronary artery disease in her father; Diabetes in her father and mother; Heart failure in her mother; Hypertension in her mother; Retinitis pigmentosa in her sister. There is no history of Amblyopia, Blindness, Glaucoma, Macular degeneration, Retinal detachment, or Strabismus.  ROS:   Review of Systems  Constitution: Negative for decreased appetite, fever and weight gain.  HENT: Negative for congestion, ear discharge, hoarse voice and sore throat.   Eyes: Negative for discharge, redness, vision loss in right eye and visual halos.  Cardiovascular: Negative for chest pain, dyspnea on exertion, leg swelling, orthopnea and palpitations.  Respiratory: Negative for cough, hemoptysis, shortness of breath and snoring.   Endocrine: Negative for heat intolerance and polyphagia.  Hematologic/Lymphatic: Negative for bleeding problem. Does not bruise/bleed easily.  Skin: Negative for flushing, nail changes, rash and suspicious lesions.  Musculoskeletal: Negative for arthritis, joint pain, muscle cramps, myalgias, neck pain and stiffness.  Gastrointestinal: Negative for abdominal pain, bowel incontinence, diarrhea and excessive appetite.  Genitourinary: Negative for decreased libido, genital sores and incomplete emptying.  Neurological: Negative for brief paralysis, focal weakness, headaches and loss of balance.  Psychiatric/Behavioral: Negative for altered mental status, depression and suicidal ideas.  Allergic/Immunologic: Negative for HIV exposure and persistent infections.    EKGs/Labs/Other Studies Reviewed:    The following studies were reviewed today:   EKG:  None today   TTE 03/27/2022 IMPRESSIONS   1. Left ventricular  ejection fraction, by estimation, is 70 to 75%. The  left ventricle has hyperdynamic function. The left ventricle has no  regional wall motion abnormalities. There is severe concentric left  ventricular hypertrophy. Left ventricular  diastolic parameters are consistent with Grade I diastolic dysfunction  (impaired relaxation). An intracavitary gradient is present with peak  gradient 61mmHg at rest and 20mmHg with valsalva. There is no SAM.   2. Right ventricular systolic function is normal. The right ventricular  size is normal. There is normal pulmonary artery systolic pressure. The  estimated right ventricular systolic pressure is 93.8 mmHg.   3. The mitral valve is abnormal. Trivial mitral valve regurgitation.   4. The aortic valve is tricuspid. There is mild calcification of the  aortic valve. There is mild thickening of the  aortic valve. Aortic valve  regurgitation is not visualized. Aortic valve sclerosis/calcification is  present, without any evidence of  aortic stenosis.   5. The inferior vena cava is normal in size with greater than 50%  respiratory variability, suggesting right atrial pressure of 3 mmHg.   Comparison(s): No prior Echocardiogram.   FINDINGS   Left Ventricle: Left ventricular ejection fraction, by estimation, is 70  to 75%. The left ventricle has hyperdynamic function. The left ventricle  has no regional wall motion abnormalities. The left ventricular internal  cavity size was normal in size.  There is severe concentric left ventricular hypertrophy. Left ventricular  diastolic parameters are consistent with Grade I diastolic dysfunction  (impaired relaxation). An intracavitary gradient is present with peak  gradient measuring at rest and  with valsalva.     Right Ventricle: The right ventricular size is normal. No increase in  right ventricular wall thickness. Right ventricular systolic function is  normal. There is normal pulmonary artery  systolic pressure. The tricuspid  regurgitant velocity is 2.33 m/s, and   with an assumed right atrial pressure of 3 mmHg, the estimated right  ventricular systolic pressure is 24.7 mmHg.   Left Atrium: Left atrial size was normal in size.   Right Atrium: Right atrial size was normal in size.   Pericardium: There is no evidence of pericardial effusion.   Mitral Valve: The mitral valve is abnormal. There is mild thickening of  the mitral valve leaflet(s). There is mild calcification of the mitral  valve leaflet(s). Mild to moderate mitral annular calcification. Trivial  mitral valve regurgitation.   Tricuspid Valve: The tricuspid valve is normal in structure. Tricuspid  valve regurgitation is trivial.   Aortic Valve: The aortic valve is tricuspid. There is mild calcification  of the aortic valve. There is mild thickening of the aortic valve. Aortic  valve regurgitation is not visualized. Aortic valve  sclerosis/calcification is present, without any  evidence of aortic stenosis.   Pulmonic Valve: The pulmonic valve was normal in structure. Pulmonic valve  regurgitation is trivial.   Aorta: The aortic root and ascending aorta are structurally normal, with  no evidence of dilitation.   Venous: The inferior vena cava is normal in size with greater than 50%  respiratory variability, suggesting right atrial pressure of 3 mmHg.   IAS/Shunts: The atrial septum is grossly normal.   CCTA 03/29/2022 IMPRESSION: 1. Coronary calcium score of 504. This was 98th percentile for age and sex matched control.   2. Normal coronary origin with left dominance.   3. Most significant lesion is calcified plaque in proximal LCX causing moderate (50-69%) stenosis. Will send for CTFFR   4. PFO   5. Dilated main pulmonary artery measuring 46mm   CAD-RADS 3. Moderate stenosis. Consider symptom-guided anti-ischemic pharmacotherapy as well as risk factor modification per guideline directed care.  Additional analysis with CT FFR will be submitted.       CTFFR 03/29/2022 1. Left Main: No significant stenosis   2. LAD: No significant stenosis   3. LCX: CTFFR 0.95 across lesion in proximal LCX   4. Ramus: No significant stenosis   5. RCA: No significant stenosis   IMPRESSION: CTFFR suggests nonobstructive CAD  Lexiscan 2018 FINDINGS: Perfusion: No decreased activity in the left ventricle on stress imaging to suggest reversible ischemia or infarction.   Wall Motion: Normal left ventricular wall motion. No left ventricular dilation.   Left Ventricular Ejection Fraction: 57 %   End diastolic volume  77 ml   End systolic volume 33 ml   IMPRESSION: 1. No reversible ischemia or infarction.   2. Normal left ventricular wall motion.   3. Left ventricular ejection fraction is 57%.   4. Non invasive risk stratification*: Low  Recent Labs: No results found for requested labs within last 365 days.  Recent Lipid Panel    Component Value Date/Time   CHOL 169 01/19/2017 1002   TRIG 120 01/19/2017 1002   HDL 52 01/19/2017 1002   CHOLHDL 3.3 01/19/2017 1002   VLDL 24 01/19/2017 1002   LDLCALC 93 01/19/2017 1002    Physical Exam:    VS:  Ht  (1.575 m)   Wt 191 lb 9.6 oz (86.9 kg)   BMI 35.04 kg/m     Wt Readings from Last 3 Encounters:  09/14/22 191 lb 9.6 oz (86.9 kg)  03/15/22 192 lb (87.1 kg)  02/01/22 185 lb (83.9 kg)     GEN: Well nourished, well developed in no acute distress HEENT: Normal NECK: No JVD; No carotid bruits LYMPHATICS: No lymphadenopathy CARDIAC: S1S2 noted,RRR, no murmurs, rubs, gallops RESPIRATORY:  Clear to auscultation without rales, wheezing or rhonchi  ABDOMEN: Soft, non-tender, non-distended, +bowel sounds, no guarding. EXTREMITIES: No edema, No cyanosis, no clubbing MUSCULOSKELETAL:  No deformity  SKIN: Warm and dry NEUROLOGIC:  Alert and oriented x 3, non-focal PSYCHIATRIC:  Normal affect, good insight  ASSESSMENT:     1. Dyspnea on exertion   2. Coronary artery disease involving native coronary artery of native heart, unspecified whether angina present   3. Hypertension, unspecified type   4. Hyperlipidemia, unspecified hyperlipidemia type   5. Obesity (BMI 30-39.9)     PLAN:    He shortness of breath is getting worse. CCTA with no significant CAD, therefore doubt this may be contributing significantly to her symptoms. The echo with normal EF and LVH with  an intracavitary gradient is present with peak gradient measuring at rest and with valsalva. No SAM - with this information I will stop the Ace-inhibitor and start beta blocker> Hoping this will help. With plan to optimize her medication as appropriate  No result yet from her monitor.   Blood pressure is at target.   Continue lipid lowering agent.  The patient is in agreement with the above plan. The patient left the office in stable condition.  The patient will follow up in 10-12 weeks or sooner if needed.   Medication Adjustments/Labs and Tests Ordered: Current medicines are reviewed at length with the patient today.  Concerns regarding medicines are outlined above.  No orders of the defined types were placed in this encounter.  Meds ordered this encounter  Medications   carvedilol (COREG) 6.25 MG tablet    Sig: Take 1 tablet (6.25 mg total) by mouth 2 (two) times daily.    Dispense:  180 tablet    Refill:  3    Patient Instructions  Medication Instructions:  Your physician has recommended you make the following change in your medication:  STOP: Lisinopril START: Coreg 6.25 mg twice daily  Please take your blood pressure daily for 2 weeks and send in a MyChart message or drop it off by the office . Please include heart rates.   HOW TO TAKE YOUR BLOOD PRESSURE: Rest 5 minutes before taking your blood pressure. Don't smoke or drink caffeinated beverages for at least 30 minutes before. Take your blood pressure before  (not after) you eat. Sit comfortably with your back  supported and both feet on the floor (don't cross your legs). Elevate your arm to heart level on a table or a desk. Use the proper sized cuff. It should fit smoothly and snugly around your bare upper arm. There should be enough room to slip a fingertip under the cuff. The bottom edge of the cuff should be 1 inch above the crease of the elbow. Ideally, take 3 measurements at one sitting and record the average.  *If you need a refill on your cardiac medications before your next appointment, please call your pharmacy*   Lab Work: None    Testing/Procedures: None   Follow-Up: At [facility], you and your health needs are our priority.  As part of our continuing mission to provide you with exceptional heart care, we have created designated Provider Care Teams.  These Care Teams include your primary Cardiologist (physician) and Advanced Practice Providers (APPs -  Physician Assistants and Nurse Practitioners) who all work together to provide you with the care you need, when you need it.  We recommend signing up for the patient portal called "MyChart".  Sign up information is provided on this After Visit Summary.  MyChart is used to connect with patients for Virtual Visits (Telemedicine).  Patients are able to view lab/test results, encounter notes, upcoming appointments, etc.  Non-urgent messages can be sent to your provider as well.   To learn more about what you can do with MyChart, go to [email or web address].    Your next appointment:   10-12 week(s)  The format for your next appointment:   In Person  Provider:   Thomasene Ripple, DO     Other Instructions   Important Information About Sugar         Adopting a Healthy Lifestyle.  Know what a healthy weight is for you (roughly BMI <25) and aim to maintain this   Aim for 7+ servings of fruits and vegetables daily   65-80+ fluid ounces of water or unsweet tea for  healthy kidneys   Limit to max 1 drink of alcohol per day; avoid smoking/tobacco   Limit animal fats in diet for cholesterol and heart health - choose grass fed whenever available   Avoid highly processed foods, and foods high in saturated/trans fats   Aim for low stress - take time to unwind and care for your mental health   Aim for 150 min of moderate intensity exercise weekly for heart health, and weights twice weekly for bone health   Aim for 7-9 hours of sleep daily   When it comes to diets, agreement about the perfect plan isnt easy to find, even among the experts. Experts at the [facility] of [employer] developed an idea known as the Healthy Eating Plate. Just imagine a plate divided into logical, healthy portions.   The emphasis is on diet quality:   Load up on vegetables and fruits - one-half of your plate: Aim for color and variety, and remember that potatoes dont count.   Go for whole grains - one-quarter of your plate: Whole wheat, barley, wheat berries, quinoa, oats, brown rice, and foods made with them. If you want pasta, go with whole wheat pasta.   Protein power - one-quarter of your plate: Fish, chicken, beans, and nuts are all healthy, versatile protein sources. Limit red meat.   The diet, however, does go beyond the plate, offering a few other suggestions.   Use healthy plant oils, such as olive, canola, soy, corn, sunflower and  peanut. Check the labels, and avoid partially hydrogenated oil, which have unhealthy trans fats.   If youre thirsty, drink water. Coffee and tea are good in moderation, but skip sugary drinks and limit milk and dairy products to one or two daily servings.   The type of carbohydrate in the diet is more important than the amount. Some sources of carbohydrates, such as vegetables, fruits, whole grains, and beans-are healthier than others.   Finally, stay active  Signed, Thomasene RippleKardie Makenzey Nanni, DO  09/16/2022 6:41 PM    [facility]

## 2022-09-16 DIAGNOSIS — E669 Obesity, unspecified: Secondary | ICD-10-CM | POA: Insufficient documentation

## 2022-09-16 DIAGNOSIS — I2510 Atherosclerotic heart disease of native coronary artery without angina pectoris: Secondary | ICD-10-CM | POA: Insufficient documentation

## 2022-09-16 DIAGNOSIS — R0609 Other forms of dyspnea: Secondary | ICD-10-CM | POA: Insufficient documentation

## 2022-11-20 ENCOUNTER — Ambulatory Visit: Payer: No Typology Code available for payment source | Admitting: Cardiology

## 2022-11-22 ENCOUNTER — Encounter: Payer: Self-pay | Admitting: Cardiology

## 2022-11-22 ENCOUNTER — Ambulatory Visit: Payer: Medicare Other | Attending: Cardiology | Admitting: Cardiology

## 2022-11-22 VITALS — BP 138/80 | HR 77 | Ht 62.0 in | Wt 189.8 lb

## 2022-11-22 DIAGNOSIS — Z79899 Other long term (current) drug therapy: Secondary | ICD-10-CM | POA: Diagnosis not present

## 2022-11-22 DIAGNOSIS — E782 Mixed hyperlipidemia: Secondary | ICD-10-CM

## 2022-11-22 DIAGNOSIS — I10 Essential (primary) hypertension: Secondary | ICD-10-CM | POA: Diagnosis not present

## 2022-11-22 DIAGNOSIS — E119 Type 2 diabetes mellitus without complications: Secondary | ICD-10-CM

## 2022-11-22 DIAGNOSIS — E669 Obesity, unspecified: Secondary | ICD-10-CM

## 2022-11-22 DIAGNOSIS — R0609 Other forms of dyspnea: Secondary | ICD-10-CM

## 2022-11-22 DIAGNOSIS — I2510 Atherosclerotic heart disease of native coronary artery without angina pectoris: Secondary | ICD-10-CM | POA: Diagnosis not present

## 2022-11-22 MED ORDER — CARVEDILOL 12.5 MG PO TABS: 12.5000 mg | ORAL_TABLET | Freq: Two times a day (BID) | ORAL | 3 refills | Status: DC

## 2022-11-22 NOTE — Progress Notes (Signed)
Cardiology Office Note:    Date:  11/22/2022   ID:  Dawn Rice, DOB 1956-09-06, MRN [id number]  PCP:  Ralene Ok, MD  Cardiologist:  Thomasene Ripple, DO  Electrophysiologist:  None   Referring MD: Ralene Ok, MD   " I am doing   History of Present Illness:    Dawn Rice is a 66 y.o. female with a hx of hypertension, hyperlipidemia, type 2 diabetes, seizures here today to be evaluated for elevated blood pressure as well as palpitations.  The patient tells me that she has been seen for intermittent chest discomfort.  She notes that it is midsternal.  It comes off and on.  Nothing makes it better or worse.  She quantifies it probably about a 5 out of 10.  She admits to associated shortness of breath.  No radiation.  In addition she tells me that she is experiencing increasing palpitations.  Based on chart review she was seen in 2018 she had a nuclear stress test which was normal. I saw the patient on 03/15/2022 she was still experiencing chest pain and shortness of breath therefore sent her for a CCTA. I also placed a monitor on her and got an echo.   At her visit on 08/28/2022 at that she was Today she tells me that she feels the shortness of breath is getting worse.  She worse on minimal exertion .     Past Medical History:  Diagnosis Date   Cataract    Epilepsy (HCC)    Gout    History of blood transfusion    "when I got my hysterectomy" (07/22/2017)   Hyperlipidemia    Hypertension    Seizures (HCC)    "from epilepsy; the kind where I space out; last one was ~ 1 yr ago" (07/22/2017)   Type II diabetes mellitus (HCC)     Past Surgical History:  Procedure Laterality Date   ABDOMINAL HYSTERECTOMY     APPENDECTOMY     CATARACT EXTRACTION     CATARACT EXTRACTION W/ INTRAOCULAR LENS  IMPLANT, BILATERAL Bilateral 2016-2017   right-left   CHOLECYSTECTOMY  03/2009   lap/notes 04/25/2011   LAPAROSCOPIC APPENDECTOMY N/A 07/21/2017   Procedure: APPENDECTOMY  LAPAROSCOPIC;  Surgeon: Manus Rudd, MD;  Location: [facility] OR;  Service: General;  Laterality: N/A;   TUBAL LIGATION      Current Medications: Current Meds  Medication Sig   allopurinol (ZYLOPRIM) 100 MG tablet Take 100 mg by mouth daily.   amLODipine (NORVASC) 2.5 MG tablet Take 1 tablet by mouth daily.   aspirin EC 81 MG tablet Take 1 tablet (81 mg total) by mouth daily. Swallow whole.   BELSOMRA 20 MG TABS Take 1 tablet by mouth at bedtime as needed.   ONETOUCH VERIO test strip U UTD   rosuvastatin (CRESTOR) 20 MG tablet Take 1 tablet (20 mg total) by mouth daily.   SYNJARDY 12.04-999 MG TABS TK 1 T PO BID   triamcinolone cream (KENALOG) 0.1 % Apply 1 application topically 2 (two) times daily. As needed for up to 3 weeks at a time. Do not use on the face or neck.   [DISCONTINUED] carvedilol (COREG) 6.25 MG tablet Take 1 tablet (6.25 mg total) by mouth 2 (two) times daily.     Allergies:   Contrave [naltrexone-bupropion hcl er], Metformin and related, and Penicillins   Social History   Socioeconomic History   Marital status: Widowed    Spouse name: Not on file   Number of  children: Not on file   Years of education: Not on file   Highest education level: Not on file  Occupational History   Not on file  Tobacco Use   Smoking status: Former    Packs/day: 0.50    Years: 20.00    Total pack years: 10.00    Types: Cigarettes    Quit date: 2009    Years since quitting: 14.9   Smokeless tobacco: Never  Vaping Use   Vaping Use: Never used  Substance and Sexual Activity   Alcohol use: Yes    Comment: 07/22/2017 "glass of wine maybe once/month"   Drug use: No   Sexual activity: Not on file  Other Topics Concern   Not on file  Social History Narrative   Widowed mother of 4.   Quit smoking in 2008.   Occasional alcohol.   Social Determinants of Health   Financial Resource Strain: Not on file  Food Insecurity: Not on file  Transportation Needs: Not on file  Physical  Activity: Not on file  Stress: Not on file  Social Connections: Not on file     Family History: The patient's family history includes Cataracts in her mother; Coronary artery disease in her father; Diabetes in her father and mother; Heart failure in her mother; Hypertension in her mother; Retinitis pigmentosa in her sister. There is no history of Amblyopia, Blindness, Glaucoma, Macular degeneration, Retinal detachment, or Strabismus.  ROS:   Review of Systems  Constitution: Negative for decreased appetite, fever and weight gain.  HENT: Negative for congestion, ear discharge, hoarse voice and sore throat.   Eyes: Negative for discharge, redness, vision loss in right eye and visual halos.  Cardiovascular: Negative for chest pain, dyspnea on exertion, leg swelling, orthopnea and palpitations.  Respiratory: Negative for cough, hemoptysis, shortness of breath and snoring.   Endocrine: Negative for heat intolerance and polyphagia.  Hematologic/Lymphatic: Negative for bleeding problem. Does not bruise/bleed easily.  Skin: Negative for flushing, nail changes, rash and suspicious lesions.  Musculoskeletal: Negative for arthritis, joint pain, muscle cramps, myalgias, neck pain and stiffness.  Gastrointestinal: Negative for abdominal pain, bowel incontinence, diarrhea and excessive appetite.  Genitourinary: Negative for decreased libido, genital sores and incomplete emptying.  Neurological: Negative for brief paralysis, focal weakness, headaches and loss of balance.  Psychiatric/Behavioral: Negative for altered mental status, depression and suicidal ideas.  Allergic/Immunologic: Negative for HIV exposure and persistent infections.    EKGs/Labs/Other Studies Reviewed:    The following studies were reviewed today:   EKG:  None today   TTE 03/27/2022 IMPRESSIONS   1. Left ventricular ejection fraction, by estimation, is 70 to 75%. The  left ventricle has hyperdynamic function. The left ventricle  has no  regional wall motion abnormalities. There is severe concentric left  ventricular hypertrophy. Left ventricular  diastolic parameters are consistent with Grade I diastolic dysfunction  (impaired relaxation). An intracavitary gradient is present with peak  gradient 29mmHg at rest and 51mmHg with valsalva. There is no SAM.   2. Right ventricular systolic function is normal. The right ventricular  size is normal. There is normal pulmonary artery systolic pressure. The  estimated right ventricular systolic pressure is 24.7 mmHg.   3. The mitral valve is abnormal. Trivial mitral valve regurgitation.   4. The aortic valve is tricuspid. There is mild calcification of the  aortic valve. There is mild thickening of the aortic valve. Aortic valve  regurgitation is not visualized. Aortic valve sclerosis/calcification is  present, without any evidence  of  aortic stenosis.   5. The inferior vena cava is normal in size with greater than 50%  respiratory variability, suggesting right atrial pressure of 3 mmHg.   Comparison(s): No prior Echocardiogram.   FINDINGS   Left Ventricle: Left ventricular ejection fraction, by estimation, is 70  to 75%. The left ventricle has hyperdynamic function. The left ventricle  has no regional wall motion abnormalities. The left ventricular internal  cavity size was normal in size.  There is severe concentric left ventricular hypertrophy. Left ventricular  diastolic parameters are consistent with Grade I diastolic dysfunction  (impaired relaxation). An intracavitary gradient is present with peak  gradient measuring at rest and  with valsalva.     Right Ventricle: The right ventricular size is normal. No increase in  right ventricular wall thickness. Right ventricular systolic function is  normal. There is normal pulmonary artery systolic pressure. The tricuspid  regurgitant velocity is 2.33 m/s, and   with an assumed right atrial pressure of 3  mmHg, the estimated right  ventricular systolic pressure is 24.7 mmHg.   Left Atrium: Left atrial size was normal in size.   Right Atrium: Right atrial size was normal in size.   Pericardium: There is no evidence of pericardial effusion.   Mitral Valve: The mitral valve is abnormal. There is mild thickening of  the mitral valve leaflet(s). There is mild calcification of the mitral  valve leaflet(s). Mild to moderate mitral annular calcification. Trivial  mitral valve regurgitation.   Tricuspid Valve: The tricuspid valve is normal in structure. Tricuspid  valve regurgitation is trivial.   Aortic Valve: The aortic valve is tricuspid. There is mild calcification  of the aortic valve. There is mild thickening of the aortic valve. Aortic  valve regurgitation is not visualized. Aortic valve  sclerosis/calcification is present, without any  evidence of aortic stenosis.   Pulmonic Valve: The pulmonic valve was normal in structure. Pulmonic valve  regurgitation is trivial.   Aorta: The aortic root and ascending aorta are structurally normal, with  no evidence of dilitation.   Venous: The inferior vena cava is normal in size with greater than 50%  respiratory variability, suggesting right atrial pressure of 3 mmHg.   IAS/Shunts: The atrial septum is grossly normal.   CCTA 03/29/2022 IMPRESSION: 1. Coronary calcium score of 504. This was 98th percentile for age and sex matched control.   2. Normal coronary origin with left dominance.   3. Most significant lesion is calcified plaque in proximal LCX causing moderate (50-69%) stenosis. Will send for CTFFR   4. PFO   5. Dilated main pulmonary artery measuring 41mm   CAD-RADS 3. Moderate stenosis. Consider symptom-guided anti-ischemic pharmacotherapy as well as risk factor modification per guideline directed care. Additional analysis with CT FFR will be submitted.       CTFFR 03/29/2022 1. Left Main: No significant stenosis   2.  LAD: No significant stenosis   3. LCX: CTFFR 0.95 across lesion in proximal LCX   4. Ramus: No significant stenosis   5. RCA: No significant stenosis   IMPRESSION: CTFFR suggests nonobstructive CAD  Lexiscan 2018 FINDINGS: Perfusion: No decreased activity in the left ventricle on stress imaging to suggest reversible ischemia or infarction.   Wall Motion: Normal left ventricular wall motion. No left ventricular dilation.   Left Ventricular Ejection Fraction: 57 %   End diastolic volume 77 ml   End systolic volume 33 ml   IMPRESSION: 1. No reversible ischemia or infarction.  2. Normal left ventricular wall motion.   3. Left ventricular ejection fraction is 57%.   4. Non invasive risk stratification*: Low  Recent Labs: No results found for requested labs within last 365 days.  Recent Lipid Panel    Component Value Date/Time   CHOL 169 01/19/2017 1002   TRIG 120 01/19/2017 1002   HDL 52 01/19/2017 1002   CHOLHDL 3.3 01/19/2017 1002   VLDL 24 01/19/2017 1002   LDLCALC 93 01/19/2017 1002    Physical Exam:    VS:  BP 138/80   Pulse 77   Ht  (1.575 m)   Wt 189 lb 12.8 oz (86.1 kg)   SpO2 99%   BMI 34.71 kg/m     Wt Readings from Last 3 Encounters:  11/22/22 189 lb 12.8 oz (86.1 kg)  09/14/22 191 lb 9.6 oz (86.9 kg)  03/15/22 192 lb (87.1 kg)     GEN: Well nourished, well developed in no acute distress HEENT: Normal NECK: No JVD; No carotid bruits LYMPHATICS: No lymphadenopathy CARDIAC: S1S2 noted,RRR, no murmurs, rubs, gallops RESPIRATORY:  Clear to auscultation without rales, wheezing or rhonchi  ABDOMEN: Soft, non-tender, non-distended, +bowel sounds, no guarding. EXTREMITIES: No edema, No cyanosis, no clubbing MUSCULOSKELETAL:  No deformity  SKIN: Warm and dry NEUROLOGIC:  Alert and oriented x 3, non-focal PSYCHIATRIC:  Normal affect, good insight  ASSESSMENT:    1. Medication management   2. DOE (dyspnea on exertion)   3. Essential  hypertension, benign   4. Coronary artery disease involving native coronary artery of native heart without angina pectoris   5. Diabetes mellitus without complication (HCC)   6. Mixed hyperlipidemia   7. Obesity (BMI 30-39.9)     PLAN:    She is still short of breath on exertion, stop start she did have increased ntracavitary gradient is present with peak gradient measuring at rest and with valsalva pressures on her recent echo with no Sam.  I am going to increase her beta-blocker.  What I like to do is place the patient on the treadmill stress echo and monitor her gradients as well she does be significantly elevated I will send her to HCM clinic.  She does have coronary artery disease her symptoms I suspect may be related to what have been described above.  We will continue to monitor her though.  Increase Coreg to 12.5 mg twice daily, continue amlodipine 2.5 mg daily..  Hold off on ACE and ARB's as well as diuretics at this time.  Unfortunately she was unable to get any good report from her sleep study given the fact that she was unable to sleep the night of her study.    Blood pressure is at target.   Continue lipid lowering agent.  The patient is in agreement with the above plan. The patient left the office in stable condition.  The patient will follow up in 10-12 weeks or sooner if needed.   Medication Adjustments/Labs and Tests Ordered: Current medicines are reviewed at length with the patient today.  Concerns regarding medicines are outlined above.  Orders Placed This Encounter  Procedures   Basic Metabolic Panel (BMET)   Magnesium   CBC   ECHOCARDIOGRAM STRESS TEST   Meds ordered this encounter  Medications   DISCONTD: carvedilol (COREG) 12.5 MG tablet    Sig: Take 1 tablet (12.5 mg total) by mouth 2 (two) times daily.    Dispense:  180 tablet    Refill:  3  carvedilol (COREG) 12.5 MG tablet    Sig: Take 1 tablet (12.5 mg total) by mouth 2 (two) times  daily.    Dispense:  180 tablet    Refill:  3    Patient Instructions  Medication Instructions:  Your physician has recommended you make the following change in your medication: INCREASE: Coreg 12.5mg  twice daily.  *If you need a refill on your cardiac medications before your next appointment, please call your pharmacy*   Lab Work: Your physician recommends that you have the following labs drawn today: BMET, Magnesium and CBC If you have labs (blood work) drawn today and your tests are completely normal, you will receive your results only by: MyChart Message (if you have MyChart) OR A paper copy in the mail If you have any lab test that is abnormal or we need to change your treatment, we will call you to review the results.   Testing/Procedures: Your physician has requested that you have a stress echocardiogram. For further information please visit [email or web address] Please follow instruction sheet as given.     Follow-Up: At [facility], you and your health needs are our priority.  As part of our continuing mission to provide you with exceptional heart care, we have created designated Provider Care Teams.  These Care Teams include your primary Cardiologist (physician) and Advanced Practice Providers (APPs -  Physician Assistants and Nurse Practitioners) who all work together to provide you with the care you need, when you need it.  We recommend signing up for the patient portal called "MyChart".  Sign up information is provided on this After Visit Summary.  MyChart is used to connect with patients for Virtual Visits (Telemedicine).  Patients are able to view lab/test results, encounter notes, upcoming appointments, etc.  Non-urgent messages can be sent to your provider as well.   To learn more about what you can do with MyChart, go to [email or web address].    Your next appointment:   8 week(s)  The format for your next appointment:   In Person  Provider:   Thomasene Ripple, DO     Adopting a Healthy Lifestyle.  Know what a healthy weight is for you (roughly BMI <25) and aim to maintain this   Aim for 7+ servings of fruits and vegetables daily   65-80+ fluid ounces of water or unsweet tea for healthy kidneys   Limit to max 1 drink of alcohol per day; avoid smoking/tobacco   Limit animal fats in diet for cholesterol and heart health - choose grass fed whenever available   Avoid highly processed foods, and foods high in saturated/trans fats   Aim for low stress - take time to unwind and care for your mental health   Aim for 150 min of moderate intensity exercise weekly for heart health, and weights twice weekly for bone health   Aim for 7-9 hours of sleep daily   When it comes to diets, agreement about the perfect plan isnt easy to find, even among the experts. Experts at the [facility] of [employer] developed an idea known as the Healthy Eating Plate. Just imagine a plate divided into logical, healthy portions.   The emphasis is on diet quality:   Load up on vegetables and fruits - one-half of your plate: Aim for color and variety, and remember that potatoes dont count.   Go for whole grains - one-quarter of your plate: Whole wheat, barley, wheat berries, quinoa, oats, brown rice, and foods made with them.  If you want pasta, go with whole wheat pasta.   Protein power - one-quarter of your plate: Fish, chicken, beans, and nuts are all healthy, versatile protein sources. Limit red meat.   The diet, however, does go beyond the plate, offering a few other suggestions.   Use healthy plant oils, such as olive, canola, soy, corn, sunflower and peanut. Check the labels, and avoid partially hydrogenated oil, which have unhealthy trans fats.   If youre thirsty, drink water. Coffee and tea are good in moderation, but skip sugary drinks and limit milk and dairy products to one or two daily servings.   The type of carbohydrate in the diet is more  important than the amount. Some sources of carbohydrates, such as vegetables, fruits, whole grains, and beans-are healthier than others.   Finally, stay active  Signed, Thomasene Ripple, DO  11/22/2022 10:10 AM    [facility]

## 2022-11-22 NOTE — Patient Instructions (Addendum)
Medication Instructions:  Your physician has recommended you make the following change in your medication: INCREASE: Coreg 12.5mg  twice daily.  *If you need a refill on your cardiac medications before your next appointment, please call your pharmacy*   Lab Work: Your physician recommends that you have the following labs drawn today: BMET, Magnesium and CBC If you have labs (blood work) drawn today and your tests are completely normal, you will receive your results only by: MyChart Message (if you have MyChart) OR A paper copy in the mail If you have any lab test that is abnormal or we need to change your treatment, we will call you to review the results.   Testing/Procedures: Your physician has requested that you have a stress echocardiogram. For further information please visit [email or web address] Please follow instruction sheet as given.     Follow-Up: At [facility], you and your health needs are our priority.  As part of our continuing mission to provide you with exceptional heart care, we have created designated Provider Care Teams.  These Care Teams include your primary Cardiologist (physician) and Advanced Practice Providers (APPs -  Physician Assistants and Nurse Practitioners) who all work together to provide you with the care you need, when you need it.  We recommend signing up for the patient portal called "MyChart".  Sign up information is provided on this After Visit Summary.  MyChart is used to connect with patients for Virtual Visits (Telemedicine).  Patients are able to view lab/test results, encounter notes, upcoming appointments, etc.  Non-urgent messages can be sent to your provider as well.   To learn more about what you can do with MyChart, go to [email or web address].    Your next appointment:   8 week(s)  The format for your next appointment:   In Person  Provider:   Thomasene Ripple, DO

## 2022-11-23 LAB — CBC
Hematocrit: 27.3 % — ABNORMAL LOW (ref 34.0–46.6)
Hemoglobin: 7.6 g/dL — ABNORMAL LOW (ref 11.1–15.9)
MCH: 18 pg — ABNORMAL LOW (ref 26.6–33.0)
MCHC: 27.8 g/dL — ABNORMAL LOW (ref 31.5–35.7)
MCV: 65 fL — ABNORMAL LOW (ref 79–97)
Platelets: 455 10*3/uL — ABNORMAL HIGH (ref 150–450)
RBC: 4.23 x10E6/uL (ref 3.77–5.28)
RDW: 22.1 % — ABNORMAL HIGH (ref 11.7–15.4)
WBC: 5.7 10*3/uL (ref 3.4–10.8)

## 2022-11-23 LAB — BASIC METABOLIC PANEL
BUN/Creatinine Ratio: 17 (ref 12–28)
BUN: 14 mg/dL (ref 8–27)
CO2: 22 mmol/L (ref 20–29)
Calcium: 9.7 mg/dL (ref 8.7–10.3)
Chloride: 108 mmol/L — ABNORMAL HIGH (ref 96–106)
Creatinine, Ser: 0.82 mg/dL (ref 0.57–1.00)
Glucose: 125 mg/dL — ABNORMAL HIGH (ref 70–99)
Potassium: 4.6 mmol/L (ref 3.5–5.2)
Sodium: 143 mmol/L (ref 134–144)
eGFR: 80 mL/min/{1.73_m2} (ref >59)

## 2022-11-23 LAB — MAGNESIUM: Magnesium: 1.6 mg/dL (ref 1.6–2.3)

## 2022-12-06 ENCOUNTER — Telehealth (HOSPITAL_COMMUNITY): Payer: Self-pay | Admitting: *Deleted

## 2022-12-06 NOTE — Telephone Encounter (Signed)
Spoke with patient and she was given instructions about her STRESS ECHO on tomorrow 12/07/22.She was asked to arrive 15 minutes before her scheduled time.

## 2022-12-07 ENCOUNTER — Ambulatory Visit (HOSPITAL_BASED_OUTPATIENT_CLINIC_OR_DEPARTMENT_OTHER): Payer: Medicare Other

## 2022-12-07 ENCOUNTER — Ambulatory Visit (HOSPITAL_COMMUNITY): Payer: Medicare Other | Attending: Cardiovascular Disease

## 2022-12-07 DIAGNOSIS — R0609 Other forms of dyspnea: Secondary | ICD-10-CM | POA: Diagnosis present

## 2022-12-07 LAB — ECHOCARDIOGRAM STRESS TEST
Area-P 1/2: 3.08 cm2
S' Lateral: 2.8 cm

## 2023-01-17 ENCOUNTER — Ambulatory Visit: Payer: Medicare Other | Attending: Cardiology | Admitting: Cardiology

## 2023-01-17 ENCOUNTER — Encounter: Payer: Self-pay | Admitting: Cardiology

## 2023-01-17 VITALS — BP 156/80 | HR 69 | Ht 62.0 in | Wt 188.8 lb

## 2023-01-17 DIAGNOSIS — E119 Type 2 diabetes mellitus without complications: Secondary | ICD-10-CM

## 2023-01-17 DIAGNOSIS — I10 Essential (primary) hypertension: Secondary | ICD-10-CM | POA: Diagnosis not present

## 2023-01-17 DIAGNOSIS — R0609 Other forms of dyspnea: Secondary | ICD-10-CM

## 2023-01-17 DIAGNOSIS — I272 Pulmonary hypertension, unspecified: Secondary | ICD-10-CM | POA: Diagnosis not present

## 2023-01-17 DIAGNOSIS — E782 Mixed hyperlipidemia: Secondary | ICD-10-CM

## 2023-01-17 DIAGNOSIS — I2510 Atherosclerotic heart disease of native coronary artery without angina pectoris: Secondary | ICD-10-CM

## 2023-01-17 DIAGNOSIS — E669 Obesity, unspecified: Secondary | ICD-10-CM

## 2023-01-17 MED ORDER — CARVEDILOL 25 MG PO TABS: 25.0000 mg | ORAL_TABLET | Freq: Two times a day (BID) | ORAL | 3 refills | Status: DC

## 2023-01-17 NOTE — Progress Notes (Signed)
Cardiology Office Note:    Date:  01/17/2023   ID:  Dawn Rice, DOB 1956-04-09, MRN [id number]  PCP:  Ralene Ok, MD  Cardiologist:  Thomasene Ripple, DO  Electrophysiologist:  None   Referring MD: Ralene Ok, MD   " I am still short of breath"   History of Present Illness:    Dawn Rice is a 67 y.o. female with a hx of hypertension, hyperlipidemia, type 2 diabetes, seizures here today to be evaluated for elevated blood pressure as well as palpitations.  The patient tells me that she has been seen for intermittent chest discomfort.  She notes that it is midsternal.  It comes off and on.  Nothing makes it better or worse.  She quantifies it probably about a 5 out of 10.  She admits to associated shortness of breath.  No radiation.  In addition she tells me that she is experiencing increasing palpitations.  Based on chart review she was seen in 2018 she had a nuclear stress test which was normal. I saw the patient on 03/15/2022 she was still experiencing chest pain and shortness of breath therefore sent her for a CCTA. I also placed a monitor on her and got an echo.   At her visit on 09/14/2022 based on her echo results showing increased intracavitary gradient I stopped her ACE inhibitors, started her on carvedilol 12.5 mg twice daily.  Also recommended that she get a stress echo.    She did get a stress echo.  She is here today to discuss the results.  She still is experiencing shortness of breath on exertion.    Past Medical History:  Diagnosis Date   Cataract    Epilepsy (HCC)    Gout    History of blood transfusion    "when I got my hysterectomy" (07/22/2017)   Hyperlipidemia    Hypertension    Seizures (HCC)    "from epilepsy; the kind where I space out; last one was ~ 1 yr ago" (07/22/2017)   Type II diabetes mellitus (HCC)     Past Surgical History:  Procedure Laterality Date   ABDOMINAL HYSTERECTOMY     APPENDECTOMY     CATARACT EXTRACTION     CATARACT  EXTRACTION W/ INTRAOCULAR LENS  IMPLANT, BILATERAL Bilateral 2016-2017   right-left   CHOLECYSTECTOMY  03/2009   lap/notes 04/25/2011   LAPAROSCOPIC APPENDECTOMY N/A 07/21/2017   Procedure: APPENDECTOMY LAPAROSCOPIC;  Surgeon: Manus Rudd, MD;  Location: [facility] OR;  Service: General;  Laterality: N/A;   TUBAL LIGATION      Current Medications: Current Meds  Medication Sig   allopurinol (ZYLOPRIM) 100 MG tablet Take 100 mg by mouth daily.   amLODipine (NORVASC) 2.5 MG tablet Take 1 tablet by mouth daily.   aspirin EC 81 MG tablet Take 1 tablet (81 mg total) by mouth daily. Swallow whole.   BELSOMRA 20 MG TABS Take 1 tablet by mouth at bedtime as needed.   carbamazepine (CARBATROL) 200 MG 12 hr capsule Take 200 mg by mouth daily.   carvedilol (COREG) 25 MG tablet Take 1 tablet (25 mg total) by mouth 2 (two) times daily.   ONETOUCH VERIO test strip U UTD   SYNJARDY 12.04-999 MG TABS TK 1 T PO BID   triamcinolone cream (KENALOG) 0.1 % Apply 1 application topically 2 (two) times daily. As needed for up to 3 weeks at a time. Do not use on the face or neck.   [DISCONTINUED] carvedilol (COREG) 12.5 MG  tablet Take 1 tablet (12.5 mg total) by mouth 2 (two) times daily.     Allergies:   Contrave [naltrexone-bupropion hcl er], Metformin and related, and Penicillins   Social History   Socioeconomic History   Marital status: Widowed    Spouse name: Not on file   Number of children: Not on file   Years of education: Not on file   Highest education level: Not on file  Occupational History   Not on file  Tobacco Use   Smoking status: Former    Packs/day: 0.50    Years: 20.00    Total pack years: 10.00    Types: Cigarettes    Quit date: 2009    Years since quitting: 15.0   Smokeless tobacco: Never  Vaping Use   Vaping Use: Never used  Substance and Sexual Activity   Alcohol use: Yes    Comment: 07/22/2017 "glass of wine maybe once/month"   Drug use: No   Sexual activity: Not on file   Other Topics Concern   Not on file  Social History Narrative   Widowed mother of 4.   Quit smoking in 2008.   Occasional alcohol.   Social Determinants of Health   Financial Resource Strain: Not on file  Food Insecurity: Not on file  Transportation Needs: Not on file  Physical Activity: Not on file  Stress: Not on file  Social Connections: Not on file     Family History: The patient's family history includes Cataracts in her mother; Coronary artery disease in her father; Diabetes in her father and mother; Heart failure in her mother; Hypertension in her mother; Retinitis pigmentosa in her sister. There is no history of Amblyopia, Blindness, Glaucoma, Macular degeneration, Retinal detachment, or Strabismus.  ROS:   Review of Systems  Constitution: Negative for decreased appetite, fever and weight gain.  HENT: Negative for congestion, ear discharge, hoarse voice and sore throat.   Eyes: Negative for discharge, redness, vision loss in right eye and visual halos.  Cardiovascular: Reports dyspnea on exertion.  Negative for chest pain, dyspnea on exertion, leg swelling, orthopnea. Respiratory: Negative for cough, hemoptysis, shortness of breath and snoring.   Endocrine: Negative for heat intolerance and polyphagia.  Hematologic/Lymphatic: Negative for bleeding problem. Does not bruise/bleed easily.  Skin: Negative for flushing, nail changes, rash and suspicious lesions.  Musculoskeletal: Negative for arthritis, joint pain, muscle cramps, myalgias, neck pain and stiffness.  Gastrointestinal: Negative for abdominal pain, bowel incontinence, diarrhea and excessive appetite.  Genitourinary: Negative for decreased libido, genital sores and incomplete emptying.  Neurological: Negative for brief paralysis, focal weakness, headaches and loss of balance.  Psychiatric/Behavioral: Negative for altered mental status, depression and suicidal ideas.  Allergic/Immunologic: Negative for HIV exposure  and persistent infections.    EKGs/Labs/Other Studies Reviewed:    The following studies were reviewed today:   EKG:  None today   Stress echo 12/07/2022 IMPRESSIONS     1. PASP increaesd from 29.67mmHg at rest to 47.91mmHg at peak exercise.   2. The findings of the study demonstrate that a stress-induced outflow  tract obstruction is present (peak LVOT gradient 26 mmHG) compared to  27mmHg at baseline.   3. Study demonstrates LVOT outflow tract gradient that worsens with  stress and moderate exercise induced pulmonary HTN.   Comparison(s): Study demonstrates LVOT outflow tract gradient that worsens  with stress and moderate exercise induced pulmonary HTN.    FINDINGS   Exam Protocol: The patient exercised on a treadmill according to a  Bruce  protocol.     Patient Performance: The patient exercised for 1 minute and 55 seconds,  achieving 4.6 METS. The maximum stage achieved was I of the Bruce  protocol. The heart rate at peak stress was 137 bpm. The target heart rate  was calculated to be 133 bpm. The  percentage of maximum predicted heart rate achieved was 87.8 %. The  baseline blood pressure was 121/73 mmHg. The blood pressure at peak stress  was 159/59 mmHg. The blood pressure response was normal. The patient  developed fatigue during the stress exam.    EKG: Resting EKG showed normal sinus rhythm with no abnormal findings. The  patient developed no abnormal EKG findings during exercise.     2D Echo Findings: The baseline ejection fraction was 70%. Baseline  regional wall motion abnormalities were not present. There were no  stress-induced wall motion abnormalities. This is a negative stress  echocardiogram for ischemia.    Stress Doppler:    LVOT: The baseline LVOT gradient was 12 mmHG. The peak LVOT gradient at  stress was 26 mmHG. The findings of the study demonstrate that a  stress-induced outflow tract obstruction is present.    Mitral Regurgitation: At  baseline, trivial mitral regurgitation was  present. During peak stress, trivial mitral regurgitation was present.    Right Ventricular Systolic Pressure: The baseline PASP was estimated to be  29.6 mmHg. The PASP increased to 47.6 mmHg at peak stress.      TTE 03/27/2022 IMPRESSIONS  1. Left ventricular ejection fraction, by estimation, is 70 to 75%. The  left ventricle has hyperdynamic function. The left ventricle has no  regional wall motion abnormalities. There is severe concentric left  ventricular hypertrophy. Left ventricular  diastolic parameters are consistent with Grade I diastolic dysfunction  (impaired relaxation). An intracavitary gradient is present with peak  gradient at rest and with valsalva. There is no SAM.   2. Right ventricular systolic function is normal. The right ventricular  size is normal. There is normal pulmonary artery systolic pressure. The  estimated right ventricular systolic pressure is 24.7 mmHg.   3. The mitral valve is abnormal. Trivial mitral valve regurgitation.   4. The aortic valve is tricuspid. There is mild calcification of the  aortic valve. There is mild thickening of the aortic valve. Aortic valve  regurgitation is not visualized. Aortic valve sclerosis/calcification is  present, without any evidence of  aortic stenosis.   5. The inferior vena cava is normal in size with greater than 50%  respiratory variability, suggesting right atrial pressure of 3 mmHg.   Comparison(s): No prior Echocardiogram.   FINDINGS   Left Ventricle: Left ventricular ejection fraction, by estimation, is 70  to 75%. The left ventricle has hyperdynamic function. The left ventricle  has no regional wall motion abnormalities. The left ventricular internal  cavity size was normal in size.  There is severe concentric left ventricular hypertrophy. Left ventricular  diastolic parameters are consistent with Grade I diastolic dysfunction  (impaired  relaxation). An intracavitary gradient is present with peak  gradient measuring at rest and  with valsalva.     Right Ventricle: The right ventricular size is normal. No increase in  right ventricular wall thickness. Right ventricular systolic function is  normal. There is normal pulmonary artery systolic pressure. The tricuspid  regurgitant velocity is 2.33 m/s, and   with an assumed right atrial pressure of 3 mmHg, the estimated right  ventricular systolic pressure is  24.7 mmHg.   Left Atrium: Left atrial size was normal in size.   Right Atrium: Right atrial size was normal in size.   Pericardium: There is no evidence of pericardial effusion.   Mitral Valve: The mitral valve is abnormal. There is mild thickening of  the mitral valve leaflet(s). There is mild calcification of the mitral  valve leaflet(s). Mild to moderate mitral annular calcification. Trivial  mitral valve regurgitation.   Tricuspid Valve: The tricuspid valve is normal in structure. Tricuspid  valve regurgitation is trivial.   Aortic Valve: The aortic valve is tricuspid. There is mild calcification  of the aortic valve. There is mild thickening of the aortic valve. Aortic  valve regurgitation is not visualized. Aortic valve  sclerosis/calcification is present, without any  evidence of aortic stenosis.   Pulmonic Valve: The pulmonic valve was normal in structure. Pulmonic valve  regurgitation is trivial.   Aorta: The aortic root and ascending aorta are structurally normal, with  no evidence of dilitation.   Venous: The inferior vena cava is normal in size with greater than 50%  respiratory variability, suggesting right atrial pressure of 3 mmHg.   IAS/Shunts: The atrial septum is grossly normal.   CCTA 03/29/2022 IMPRESSION: 1. Coronary calcium score of 504. This was 98th percentile for age and sex matched control.   2. Normal coronary origin with left dominance.   3. Most significant  lesion is calcified plaque in proximal LCX causing moderate (50-69%) stenosis. Will send for CTFFR   4. PFO   5. Dilated main pulmonary artery measuring 73mm   CAD-RADS 3. Moderate stenosis. Consider symptom-guided anti-ischemic pharmacotherapy as well as risk factor modification per guideline directed care. Additional analysis with CT FFR will be submitted.       CTFFR 03/29/2022 1. Left Main: No significant stenosis   2. LAD: No significant stenosis   3. LCX: CTFFR 0.95 across lesion in proximal LCX   4. Ramus: No significant stenosis   5. RCA: No significant stenosis   IMPRESSION: CTFFR suggests nonobstructive CAD  Lexiscan 2018 FINDINGS: Perfusion: No decreased activity in the left ventricle on stress imaging to suggest reversible ischemia or infarction.   Wall Motion: Normal left ventricular wall motion. No left ventricular dilation.   Left Ventricular Ejection Fraction: 57 %   End diastolic volume 77 ml   End systolic volume 33 ml   IMPRESSION: 1. No reversible ischemia or infarction.   2. Normal left ventricular wall motion.   3. Left ventricular ejection fraction is 57%.   4. Non invasive risk stratification*: Low  Recent Labs: 11/22/2022: BUN 14; Creatinine, Ser 0.82; Hemoglobin 7.6; Magnesium 1.6; Platelets 455; Potassium 4.6; Sodium 143  Recent Lipid Panel    Component Value Date/Time   CHOL 169 01/19/2017 1002   TRIG 120 01/19/2017 1002   HDL 52 01/19/2017 1002   CHOLHDL 3.3 01/19/2017 1002   VLDL 24 01/19/2017 1002   LDLCALC 93 01/19/2017 1002    Physical Exam:    VS:  BP (!) 156/80   Pulse 69   Ht 5\' 2"  (1.575 m)   Wt 85.6 kg   SpO2 98%   BMI 34.53 kg/m     Wt Readings from Last 3 Encounters:  01/17/23 85.6 kg  11/22/22 86.1 kg  09/14/22 86.9 kg     GEN: Well nourished, well developed in no acute distress HEENT: Normal NECK: No JVD; No carotid bruits LYMPHATICS: No lymphadenopathy CARDIAC: S1S2 noted,RRR, no murmurs,  rubs, gallops RESPIRATORY:  Clear to auscultation without rales, wheezing or rhonchi  ABDOMEN: Soft, non-tender, non-distended, +bowel sounds, no guarding. EXTREMITIES: No edema, No cyanosis, no clubbing MUSCULOSKELETAL:  No deformity  SKIN: Warm and dry NEUROLOGIC:  Alert and oriented x 3, non-focal PSYCHIATRIC:  Normal affect, good insight  ASSESSMENT:    1. Dyspnea on exertion   2. Moderate pulmonary hypertension (HCC)   3. Essential hypertension, benign   4. Coronary artery disease involving native coronary artery of native heart without angina pectoris   5. Diabetes mellitus without complication (HCC)   6. Mixed hyperlipidemia   7. Obesity (BMI 30-39.9)      PLAN:    Her last echo showed evidence of moderate exercise-induced pulmonary hypertension as well and some increased peak exercise LVOT gradient-goal less than peak gradient on resting echocardiogram with Valsalva.  Will increase her beta-blocker today to 25 mg twice daily.   In the meantime I think she would benefit from being evaluated by our advanced heart failure team for her moderate pulmonary hypertension I think she is symptomatic from that and she can hopefully be further evaluated and treated.  Will continue her aspirin and statin for her coronary artery disease I do not believe that her symptoms is anginal in nature her dyspnea exertion I suspect is in the setting of her pulmonary hypertension.  The patient understands the need to lose weight with diet and exercise. We have discussed specific strategies for this.  Unfortunately she was unable to get any good report from her sleep study given the fact that she was unable to sleep the night of her study.   Continue lipid lowering agent.  The patient is in agreement with the above plan. The patient left the office in stable condition.  The patient will follow up in 10-12 weeks or sooner if needed.   Medication Adjustments/Labs and Tests Ordered: Current  medicines are reviewed at length with the patient today.  Concerns regarding medicines are outlined above.  Orders Placed This Encounter  Procedures   AMB referral to CHF clinic   Meds ordered this encounter  Medications   carvedilol (COREG) 25 MG tablet    Sig: Take 1 tablet (25 mg total) by mouth 2 (two) times daily.    Dispense:  180 tablet    Refill:  3    Patient Instructions  Medication Instructions:  Increase Carvedilol (Coreg) to 25mg  twice a day *If you need a refill on your cardiac medications before your next appointment, please call your pharmacy*   Lab Work: None Ordered   Testing/Procedures: None Ordered   Follow-Up: At [facility], you and your health needs are our priority.  As part of our continuing mission to provide you with exceptional heart care, we have created designated Provider Care Teams.  These Care Teams include your primary Cardiologist (physician) and Advanced Practice Providers (APPs -  Physician Assistants and Nurse Practitioners) who all work together to provide you with the care you need, when you need it.  We recommend signing up for the patient portal called "MyChart".  Sign up information is provided on this After Visit Summary.  MyChart is used to connect with patients for Virtual Visits (Telemedicine).  Patients are able to view lab/test results, encounter notes, upcoming appointments, etc.  Non-urgent messages can be sent to your provider as well.   To learn more about what you can do with MyChart, go to [email or web address].    Your next appointment:   6 month(s)  Provider:  Olga Seyler, DO      Adopting a Healthy Lifestyle.  Know what a healthy weight is for you (roughly BMI <25) and aim to maintain this   Aim for 7+ servings of fruits and vegetables daily   65-80+ fluid ounces of water or unsweet tea for healthy kidneys   Limit to max 1 drink of alcohol per day; avoid smoking/tobacco   Limit animal fats in  diet for cholesterol and heart health - choose grass fed whenever available   Avoid highly processed foods, and foods high in saturated/trans fats   Aim for low stress - take time to unwind and care for your mental health   Aim for 150 min of moderate intensity exercise weekly for heart health, and weights twice weekly for bone health   Aim for 7-9 hours of sleep daily   When it comes to diets, agreement about the perfect plan isnt easy to find, even among the experts. Experts at the [facility] [employer] developed an idea known as the Healthy Eating Plate. Just imagine a plate divided into logical, healthy portions.   The emphasis is on diet quality:   Load up on vegetables and fruits - one-half of your plate: Aim for color and variety, and remember that potatoes dont count.   Go for whole grains - one-quarter of your plate: Whole wheat, barley, wheat berries, quinoa, oats, brown rice, and foods made with them. If you want pasta, go with whole wheat pasta.   Protein power - one-quarter of your plate: Fish, chicken, beans, and nuts are all healthy, versatile protein sources. Limit red meat.   The diet, however, does go beyond the plate, offering a few other suggestions.   Use healthy plant oils, such as olive, canola, soy, corn, sunflower and peanut. Check the labels, and avoid partially hydrogenated oil, which have unhealthy trans fats.   If youre thirsty, drink water. Coffee and tea are good in moderation, but skip sugary drinks and limit milk and dairy products to one or two daily servings.   The type of carbohydrate in the diet is more important than the amount. Some sources of carbohydrates, such as vegetables, fruits, whole grains, and beans-are healthier than others.   Finally, stay active  Signed, Thomasene Ripple, DO  01/17/2023 11:00 AM    [facility]

## 2023-01-17 NOTE — Patient Instructions (Signed)
Medication Instructions:  Increase Carvedilol (Coreg) to 25mg  twice a day *If you need a refill on your cardiac medications before your next appointment, please call your pharmacy*   Lab Work: None Ordered   Testing/Procedures: None Ordered   Follow-Up: At [facility], you and your health needs are our priority.  As part of our continuing mission to provide you with exceptional heart care, we have created designated Provider Care Teams.  These Care Teams include your primary Cardiologist (physician) and Advanced Practice Providers (APPs -  Physician Assistants and Nurse Practitioners) who all work together to provide you with the care you need, when you need it.  We recommend signing up for the patient portal called "MyChart".  Sign up information is provided on this After Visit Summary.  MyChart is used to connect with patients for Virtual Visits (Telemedicine).  Patients are able to view lab/test results, encounter notes, upcoming appointments, etc.  Non-urgent messages can be sent to your provider as well.   To learn more about what you can do with MyChart, go to [email or web address].    Your next appointment:   6 month(s)  Provider:   Berniece Salines, DO

## 2023-03-14 ENCOUNTER — Encounter (HOSPITAL_COMMUNITY): Payer: Self-pay | Admitting: Cardiology

## 2023-03-14 ENCOUNTER — Other Ambulatory Visit (HOSPITAL_COMMUNITY): Payer: Self-pay

## 2023-03-14 ENCOUNTER — Ambulatory Visit (HOSPITAL_COMMUNITY)
Admission: RE | Admit: 2023-03-14 | Discharge: 2023-03-14 | Disposition: A | Discharge status: Home / Self Care | Payer: Medicare Other | Source: Ambulatory Visit | Attending: Cardiology | Admitting: Cardiology

## 2023-03-14 VITALS — BP 124/70 | HR 72 | Wt 188.6 lb

## 2023-03-14 DIAGNOSIS — R0609 Other forms of dyspnea: Secondary | ICD-10-CM

## 2023-03-14 DIAGNOSIS — Z7982 Long term (current) use of aspirin: Secondary | ICD-10-CM | POA: Diagnosis not present

## 2023-03-14 DIAGNOSIS — I5032 Chronic diastolic (congestive) heart failure: Secondary | ICD-10-CM | POA: Insufficient documentation

## 2023-03-14 DIAGNOSIS — I110 Hypertensive heart disease with heart failure: Secondary | ICD-10-CM | POA: Diagnosis not present

## 2023-03-14 DIAGNOSIS — Z87891 Personal history of nicotine dependence: Secondary | ICD-10-CM | POA: Diagnosis not present

## 2023-03-14 DIAGNOSIS — I2510 Atherosclerotic heart disease of native coronary artery without angina pectoris: Secondary | ICD-10-CM | POA: Diagnosis not present

## 2023-03-14 DIAGNOSIS — R5383 Other fatigue: Secondary | ICD-10-CM | POA: Diagnosis not present

## 2023-03-14 DIAGNOSIS — Z79899 Other long term (current) drug therapy: Secondary | ICD-10-CM | POA: Insufficient documentation

## 2023-03-14 DIAGNOSIS — D509 Iron deficiency anemia, unspecified: Secondary | ICD-10-CM | POA: Insufficient documentation

## 2023-03-14 DIAGNOSIS — I272 Pulmonary hypertension, unspecified: Secondary | ICD-10-CM | POA: Insufficient documentation

## 2023-03-14 LAB — CBC
HCT: 33.7 % — ABNORMAL LOW (ref 36.0–46.0)
Hemoglobin: 9.3 g/dL — ABNORMAL LOW (ref 12.0–15.0)
MCH: 19.9 pg — ABNORMAL LOW (ref 26.0–34.0)
MCHC: 27.6 g/dL — ABNORMAL LOW (ref 30.0–36.0)
MCV: 72.2 fL — ABNORMAL LOW (ref 80.0–100.0)
Platelets: 413 10*3/uL — ABNORMAL HIGH (ref 150–400)
RBC: 4.67 MIL/uL (ref 3.87–5.11)
RDW: 24.8 % — ABNORMAL HIGH (ref 11.5–15.5)
WBC: 6.6 10*3/uL (ref 4.0–10.5)
nRBC: 0 % (ref 0.0–0.2)

## 2023-03-14 LAB — BASIC METABOLIC PANEL
Anion gap: 14 (ref 5–15)
BUN: 11 mg/dL (ref 8–23)
CO2: 23 mmol/L (ref 22–32)
Calcium: 9.4 mg/dL (ref 8.9–10.3)
Chloride: 102 mmol/L (ref 98–111)
Creatinine, Ser: 0.92 mg/dL (ref 0.44–1.00)
GFR, Estimated: >60 mL/min (ref >60)
Glucose, Bld: 125 mg/dL — ABNORMAL HIGH (ref 70–99)
Potassium: 4.3 mmol/L (ref 3.5–5.1)
Sodium: 139 mmol/L (ref 135–145)

## 2023-03-14 LAB — IRON AND TIBC
Iron: 37 ug/dL (ref 28–170)
Saturation Ratios: 8 % — ABNORMAL LOW (ref 10.4–31.8)
TIBC: 470 ug/dL — ABNORMAL HIGH (ref 250–450)
UIBC: 433 ug/dL

## 2023-03-14 LAB — TSH: TSH: 0.561 u[IU]/mL (ref 0.350–4.500)

## 2023-03-14 LAB — BRAIN NATRIURETIC PEPTIDE: B Natriuretic Peptide: 18.5 pg/mL (ref 0.0–100.0)

## 2023-03-14 LAB — FERRITIN: Ferritin: 7 ng/mL — ABNORMAL LOW (ref 11–307)

## 2023-03-14 LAB — LIPID PANEL
Cholesterol: 140 mg/dL (ref 0–200)
HDL: 49 mg/dL (ref >40)
LDL Cholesterol: 69 mg/dL (ref 0–99)
Total CHOL/HDL Ratio: 2.9 RATIO
Triglycerides: 110 mg/dL (ref <150)
VLDL: 22 mg/dL (ref 0–40)

## 2023-03-14 MED ORDER — DILTIAZEM HCL ER COATED BEADS 180 MG PO CP24: 180.0000 mg | ORAL_CAPSULE | Freq: Every day | ORAL | 3 refills | Status: DC

## 2023-03-14 NOTE — Patient Instructions (Addendum)
STOP Amlodipine  START Diltiazem 180 mg daily.  Labs done today, your results will be available in MyChart, we will contact you for abnormal readings.  Your physician has requested that you have a cardiac MRI. Cardiac MRI uses a computer to create images of your heart as its beating, producing both still and moving pictures of your heart and major blood vessels. For further information please visit [email or web address] Please follow the instruction sheet given to you today for more information. ONCE APPROVED BY YOUR INSURANCE COMPANY, WE WILL CALL YOU TO ARRANGE THE TEST.  Your provider has recommended that you have a home sleep study (Itamar Test).  We have provided you with the equipment in our office today. Please go ahead and download the app. DO NOT OPEN OR TAMPER WITH THE BOX UNTIL WE ADVISE YOU TO DO SO. Once insurance has approved the test our office will call you with PIN number and approval to proceed with testing. Once you have completed the test you just dispose of the equipment, the information is automatically uploaded to [place] via blue-tooth technology. If your test is positive for sleep apnea and you need a home CPAP machine you will be contacted by Dr Theodosia Blender office [facility]) to set this up.    You are scheduled for a Cardiac Catheterization on Friday, April 5 with Dr. Loralie Champagne.  1. Please arrive at the [facility] (Main Entrance A) at [facility]: [address] [place], [place] [address] at 8:30 AM (This time is two hours before your procedure to ensure your preparation). Free valet parking service is available.   Special note: Every effort is made to have your procedure done on time. Please understand that emergencies sometimes delay scheduled procedures.  2. Diet: Do not eat solid foods after midnight.  The patient may have clear liquids until 5am upon the day of the procedure.  3.  Medication instructions in preparation for your procedure:   Contrast  Allergy: No  HOLD Synjardy the morning of your procedure.  On the morning of your procedure, take  any morning medicines NOT listed above.  You may use sips of water.  5. Plan for one night stay--bring personal belongings. 6. Bring a current list of your medications and current insurance cards. 7. You MUST have a responsible person to drive you home. 8. Someone MUST be with you the first 24 hours after you arrive home or your discharge will be delayed. 9. Please wear clothes that are easy to get on and off and wear slip-on shoes.  Your physician recommends that you schedule a follow-up appointment in: 1 month  If you have any questions or concerns before your next appointment please send [place] a message through [place] or call our office at [phone].    TO LEAVE A MESSAGE FOR THE NURSE SELECT OPTION 2, PLEASE LEAVE A MESSAGE INCLUDING: YOUR NAME DATE OF BIRTH CALL BACK NUMBER REASON FOR CALL**this is important as we prioritize the call backs  YOU WILL RECEIVE A CALL BACK THE SAME DAY AS LONG AS YOU CALL BEFORE 4:00 PM  At the [facility], you and your health needs are our priority. As part of our continuing mission to provide you with exceptional heart care, we have created designated Provider Care Teams. These Care Teams include your primary Cardiologist (physician) and Advanced Practice Providers (APPs- Physician Assistants and Nurse Practitioners) who all work together to provide you with the care you need, when you need it.  You may see any of the following providers on your designated Care Team at your next follow up: Dr Glori Bickers Dr Loralie Champagne Dr. Roxana Hires, NP Lyda Jester, [place] [facility] [place], [place] Forestine Na, NP Audry Riles, PharmD   Please be sure to bring in all your medications bottles to every appointment.    Thank you for choosing [facility]

## 2023-03-14 NOTE — Progress Notes (Signed)
Height:     Weight: BMI:  Today's Date:  STOP BANG RISK ASSESSMENT S (snore) Have you been told that you snore?     YES   T (tired) Are you often tired, fatigued, or sleepy during the day?   YES  O (obstruction) Do you stop breathing, choke, or gasp during sleep? NO   P (pressure) Do you have or are you being treated for high blood pressure? YES   B (BMI) Is your body index greater than 35 kg/m? NO   A (age) Are you 65 years old or older? YES   N (neck) Do you have a neck circumference greater than 16 inches?   NO   G (gender) Are you a female? NO   TOTAL STOP/BANG "YES" ANSWERS 4                                                                       For Office Use Only              Procedure Order Form    YES to 3+ Stop Bang questions OR two clinical symptoms - patient qualifies for WatchPAT (CPT 95800)      Clinical Notes: Will consult Sleep Specialist and refer for management of therapy due to patient increased risk of Sleep Apnea. Ordering a sleep study due to the following two clinical symptoms: Excessive daytime sleepiness G47.10 / Gastroesophageal reflux K21.9 / Nocturia R35.1 / Morning Headaches G44.221 / Difficulty concentrating R41.840 / Memory problems or poor judgment G31.84 / Personality changes or irritability R45.4 / Loud snoring R06.83 / Depression F32.9 / Unrefreshed by sleep G47.8 / Impotence N52.9 / History of high blood pressure R03.0 / Insomnia G47.00        

## 2023-03-14 NOTE — Progress Notes (Addendum)
PCP: Jilda Panda, MD Cardiology: Dr. Harriet Masson HF Cardiology: Dr. Aundra Dubin  67 y.o. with history of HTN, hyperlipidemia, and diabetes was referred by Dr. Harriet Masson for evaluation of diastolic CHF/pulmonary hypertension.  Patient has had some exertional dyspnea for a year.  I reviewed her 4/23 echo, this showed EF 70-75%, severe LVH with some asymmetry of the basal septum, LV mid cavitary gradient peak 29 at rest and 51 with Valsalva, no SAM, normal RV.  Coronary CTA in 4/23 showed a moderate proximal LCx stenosis that did not appear hemodynamically significant by FFR. Her beta blocker was titrated up with LV mid-cavity gradient.  However, over the last 3-4 months, her dyspnea has become significantly worse.  She is short of breath walking up just a few stairs.  She gets very easily fatigued.  She is dyspneic with moderate work around the house and walking short distances on flat ground.  No chest pain.  No lightheadedness or palpitations.  She quit smoking in 2009.   Stress echo was done in 12/23, showing that LV mid cavity gradient increased from 12 peak to 26 peak with exercise, PASP increased from 30 to 48 with exercise.  No wall motion abnormalities with exercise.   Also of note, patient had Fe deficiency anemia.  She was seen by GI and found to be guaiac negative. No BRBPR/melena.   ECG (personally reviewed): NSR, anterolateral T wave flattening  Labs (11/23): hgb 7.6, K 4.6, creatinine 0.8  PMH: 1. HTN 2. Type 2 diabetes 3. Hyperlipidemia 4. Epilepsy with h/o seizures. 5. Fe deficiency anemia 6. Remote tobacco use (quit 2009).  7. Chronic diastolic CHF: Echo (0000000) with EF 70-75%, severe LVH with some asymmetry of the basal septum, LV mid cavitary gradient peak 29 at rest and 51 with Valsalva, no SAM, normal RV.  - Stress echo (12/23): LV mid cavity gradient increased from 12 peak to 26 peak with exercise, PASP increased from 30 to 48 with exercise.  No wall motion abnormalities with exercise.  8.  CAD: Coronary CTA in 4/23 showed 50-69% proximal LCx stenosis, FFR negative.  - Stress echo in 12/23 as above was negative for ischemia.   Social History   Socioeconomic History   Marital status: Widowed    Spouse name: Not on file   Number of children: Not on file   Years of education: Not on file   Highest education level: Not on file  Occupational History   Not on file  Tobacco Use   Smoking status: Former    Packs/day: 0.50    Years: 20.00    Additional pack years: 0.00    Total pack years: 10.00    Types: Cigarettes    Quit date: 2009    Years since quitting: 15.2   Smokeless tobacco: Never  Vaping Use   Vaping Use: Never used  Substance and Sexual Activity   Alcohol use: Yes    Comment: 07/22/2017 "glass of wine maybe once/month"   Drug use: No   Sexual activity: Not on file  Other Topics Concern   Not on file  Social History Narrative   Widowed mother of 4.   Quit smoking in 2008.   Occasional alcohol.   Social Determinants of Health   Financial Resource Strain: Not on file  Food Insecurity: Not on file  Transportation Needs: Not on file  Physical Activity: Not on file  Stress: Not on file  Social Connections: Not on file  Intimate Partner Violence: Not on file  FH: Mother with "hole in her heart," father with CAD.   ROS: All systems reviewed and negative except as per HPI.   Current Outpatient Medications  Medication Sig Dispense Refill   allopurinol (ZYLOPRIM) 100 MG tablet Take 100 mg by mouth daily.     aspirin EC 81 MG tablet Take 1 tablet (81 mg total) by mouth daily. Swallow whole. 90 tablet 3   BELSOMRA 20 MG TABS Take 1 tablet by mouth at bedtime as needed.     carbamazepine (CARBATROL) 200 MG 12 hr capsule Take 200 mg by mouth daily.  3   carvedilol (COREG) 25 MG tablet Take 1 tablet (25 mg total) by mouth 2 (two) times daily. 180 tablet 3   diltiazem (CARDIZEM CD) 180 MG 24 hr capsule Take 1 capsule (180 mg total) by mouth daily. 90 capsule  3   ONETOUCH VERIO test strip U UTD  2   rosuvastatin (CRESTOR) 20 MG tablet Take 1 tablet (20 mg total) by mouth daily. 90 tablet 3   SYNJARDY 12.04-999 MG TABS TK 1 T PO BID  1   triamcinolone cream (KENALOG) 0.1 % Apply 1 application topically 2 (two) times daily. As needed for up to 3 weeks at a time. Do not use on the face or neck. 80 g 1   No current facility-administered medications for this encounter.   BP 124/70   Pulse 72   Wt 85.5 kg (188 lb 9.6 oz)   SpO2 98%   BMI 34.50 kg/m  General: NAD Neck: No JVD, no thyromegaly or thyroid nodule.  Lungs: Clear to auscultation bilaterally with normal respiratory effort. CV: Nondisplaced PMI.  Heart regular S1/S2, no S3/S4, 2/6 SEM RUSB.  No peripheral edema.  No carotid bruit.  Normal pedal pulses.  Abdomen: Soft, nontender, no hepatosplenomegaly, no distention.  Skin: Intact without lesions or rashes.  Neurologic: Alert and oriented x 3.  Psych: Normal affect. Extremities: No clubbing or cyanosis.  HEENT: Normal.   Assessment/Plan: 1. Chronic diastolic CHF:  Echo in 0000000 showed EF 70-75%, severe LVH with some asymmetry of the basal septum, LV mid cavitary gradient peak 29 at rest and 51 with Valsalva, no SAM, normal RV.  Coronary CTA in 4/23 showed a moderate proximal LCx stenosis that did not appear hemodynamically significant by FFR. Her beta blocker was titrated up with LV mid-cavity gradient.  However, over the last 3-4 months, her dyspnea has become significantly worse, NYHA class III.  Stress echo was done in 12/23, showing that LV mid cavity gradient increased from 12 peak to 26 peak with exercise, PASP increased from 30 to 48 with exercise.  No wall motion abnormalities with exercise.  I reviewed her 4/23 echo.  I am concerned that she could have a form of hypertrophic cardiomyopathy or cardiac amyloidosis. She is not volume overloaded on exam.  - I will arrange for cardiac MRI to assess for evidence of HCM or infiltrative  disease.   - I will arrange for RHC/LHC to assess filling pressures and PA pressure.  Given worsening dyspnea for 3-4 months (post-coronary CTA), I will also do coronary angiography to assess for worsening CAD as cause of symptoms.  We discussed risks/benefits and she agrees to procedure.  - I will arrange for home sleep study given significant component of fatigue.  - I will use negative inotropy to try to cut down on LV mid-cavity gradient.  She is already on Coreg 25 mg bid.  I will stop amlodipine and  start diltiazem 180 mg daily.   - Check BNP today.  - Continue Jardiance.  2. CAD: Coronary CTA in 4/23 with moderate LCx stenosis, not hemodynamically significant by FFR.  Worsening exertional symptoms over the last 3-4 months, ?worsening of CAD.  - As above, I will arrange for coronary angiography with RHC.  - Continue Crestor - Continue ASA 81 mg daily.  3. Fe deficiency anemia: This may contribute to her dyspnea/fatigue.  - Check CBC today and will send Fe studies.  Will give IV Fe if she remains Fe deficient.  4. HTN: BP is now controlled.  5. Pulmonary hypertension: Stress echo in 12/23 showed increase in PASP from 30 => 48 mmHg with exertion.  I think her PH is most likely group 2 with diastolic dysfunction, but will assess by RHC as above.   Followup in 1 month.  Loralie Champagne 03/15/2023

## 2023-03-14 NOTE — H&P (View-Only) (Signed)
PCP: Moreira, Roy, MD Cardiology: Dr. Tobb HF Cardiology: Dr. Whittany Parish  67 y.o. with history of HTN, hyperlipidemia, and diabetes was referred by Dr. Tobb for evaluation of diastolic CHF/pulmonary hypertension.  Patient has had some exertional dyspnea for a year.  I reviewed her 4/23 echo, this showed EF 70-75%, severe LVH with some asymmetry of the basal septum, LV mid cavitary gradient peak 29 at rest and 51 with Valsalva, no SAM, normal RV.  Coronary CTA in 4/23 showed a moderate proximal LCx stenosis that did not appear hemodynamically significant by FFR. Her beta blocker was titrated up with LV mid-cavity gradient.  However, over the last 3-4 months, her dyspnea has become significantly worse.  She is short of breath walking up just a few stairs.  She gets very easily fatigued.  She is dyspneic with moderate work around the house and walking short distances on flat ground.  No chest pain.  No lightheadedness or palpitations.  She quit smoking in 2009.   Stress echo was done in 12/23, showing that LV mid cavity gradient increased from 12 peak to 26 peak with exercise, PASP increased from 30 to 48 with exercise.  No wall motion abnormalities with exercise.   Also of note, patient had Fe deficiency anemia.  She was seen by GI and found to be guaiac negative. No BRBPR/melena.   ECG (personally reviewed): NSR, anterolateral T wave flattening  Labs (11/23): hgb 7.6, K 4.6, creatinine 0.8  PMH: 1. HTN 2. Type 2 diabetes 3. Hyperlipidemia 4. Epilepsy with h/o seizures. 5. Fe deficiency anemia 6. Remote tobacco use (quit 2009).  7. Chronic diastolic CHF: Echo (4/23) with EF 70-75%, severe LVH with some asymmetry of the basal septum, LV mid cavitary gradient peak 29 at rest and 51 with Valsalva, no SAM, normal RV.  - Stress echo (12/23): LV mid cavity gradient increased from 12 peak to 26 peak with exercise, PASP increased from 30 to 48 with exercise.  No wall motion abnormalities with exercise.  8.  CAD: Coronary CTA in 4/23 showed 50-69% proximal LCx stenosis, FFR negative.  - Stress echo in 12/23 as above was negative for ischemia.   Social History   Socioeconomic History   Marital status: Widowed    Spouse name: Not on file   Number of children: Not on file   Years of education: Not on file   Highest education level: Not on file  Occupational History   Not on file  Tobacco Use   Smoking status: Former    Packs/day: 0.50    Years: 20.00    Additional pack years: 0.00    Total pack years: 10.00    Types: Cigarettes    Quit date: 2009    Years since quitting: 15.2   Smokeless tobacco: Never  Vaping Use   Vaping Use: Never used  Substance and Sexual Activity   Alcohol use: Yes    Comment: 07/22/2017 "glass of wine maybe once/month"   Drug use: No   Sexual activity: Not on file  Other Topics Concern   Not on file  Social History Narrative   Widowed mother of 4.   Quit smoking in 2008.   Occasional alcohol.   Social Determinants of Health   Financial Resource Strain: Not on file  Food Insecurity: Not on file  Transportation Needs: Not on file  Physical Activity: Not on file  Stress: Not on file  Social Connections: Not on file  Intimate Partner Violence: Not on file     FH: Mother with "hole in her heart," father with CAD.   ROS: All systems reviewed and negative except as per HPI.   Current Outpatient Medications  Medication Sig Dispense Refill   allopurinol (ZYLOPRIM) 100 MG tablet Take 100 mg by mouth daily.     aspirin EC 81 MG tablet Take 1 tablet (81 mg total) by mouth daily. Swallow whole. 90 tablet 3   BELSOMRA 20 MG TABS Take 1 tablet by mouth at bedtime as needed.     carbamazepine (CARBATROL) 200 MG 12 hr capsule Take 200 mg by mouth daily.  3   carvedilol (COREG) 25 MG tablet Take 1 tablet (25 mg total) by mouth 2 (two) times daily. 180 tablet 3   diltiazem (CARDIZEM CD) 180 MG 24 hr capsule Take 1 capsule (180 mg total) by mouth daily. 90 capsule  3   ONETOUCH VERIO test strip U UTD  2   rosuvastatin (CRESTOR) 20 MG tablet Take 1 tablet (20 mg total) by mouth daily. 90 tablet 3   SYNJARDY 12.04-999 MG TABS TK 1 T PO BID  1   triamcinolone cream (KENALOG) 0.1 % Apply 1 application topically 2 (two) times daily. As needed for up to 3 weeks at a time. Do not use on the face or neck. 80 g 1   No current facility-administered medications for this encounter.   BP 124/70   Pulse 72   Wt 85.5 kg (188 lb 9.6 oz)   SpO2 98%   BMI 34.50 kg/m  General: NAD Neck: No JVD, no thyromegaly or thyroid nodule.  Lungs: Clear to auscultation bilaterally with normal respiratory effort. CV: Nondisplaced PMI.  Heart regular S1/S2, no S3/S4, 2/6 SEM RUSB.  No peripheral edema.  No carotid bruit.  Normal pedal pulses.  Abdomen: Soft, nontender, no hepatosplenomegaly, no distention.  Skin: Intact without lesions or rashes.  Neurologic: Alert and oriented x 3.  Psych: Normal affect. Extremities: No clubbing or cyanosis.  HEENT: Normal.   Assessment/Plan: 1. Chronic diastolic CHF:  Echo in 4/23 showed EF 70-75%, severe LVH with some asymmetry of the basal septum, LV mid cavitary gradient peak 29 at rest and 51 with Valsalva, no SAM, normal RV.  Coronary CTA in 4/23 showed a moderate proximal LCx stenosis that did not appear hemodynamically significant by FFR. Her beta blocker was titrated up with LV mid-cavity gradient.  However, over the last 3-4 months, her dyspnea has become significantly worse, NYHA class III.  Stress echo was done in 12/23, showing that LV mid cavity gradient increased from 12 peak to 26 peak with exercise, PASP increased from 30 to 48 with exercise.  No wall motion abnormalities with exercise.  I reviewed her 4/23 echo.  I am concerned that she could have a form of hypertrophic cardiomyopathy or cardiac amyloidosis. She is not volume overloaded on exam.  - I will arrange for cardiac MRI to assess for evidence of HCM or infiltrative  disease.   - I will arrange for RHC/LHC to assess filling pressures and PA pressure.  Given worsening dyspnea for 3-4 months (post-coronary CTA), I will also do coronary angiography to assess for worsening CAD as cause of symptoms.  We discussed risks/benefits and she agrees to procedure.  - I will arrange for home sleep study given significant component of fatigue.  - I will use negative inotropy to try to cut down on LV mid-cavity gradient.  She is already on Coreg 25 mg bid.  I will stop amlodipine and   start diltiazem 180 mg daily.   - Check BNP today.  - Continue Jardiance.  2. CAD: Coronary CTA in 4/23 with moderate LCx stenosis, not hemodynamically significant by FFR.  Worsening exertional symptoms over the last 3-4 months, ?worsening of CAD.  - As above, I will arrange for coronary angiography with RHC.  - Continue Crestor - Continue ASA 81 mg daily.  3. Fe deficiency anemia: This may contribute to her dyspnea/fatigue.  - Check CBC today and will send Fe studies.  Will give IV Fe if she remains Fe deficient.  4. HTN: BP is now controlled.  5. Pulmonary hypertension: Stress echo in 12/23 showed increase in PASP from 30 => 48 mmHg with exertion.  I think her PH is most likely group 2 with diastolic dysfunction, but will assess by RHC as above.   Followup in 1 month.  Dawn Rice 03/15/2023  

## 2023-03-19 ENCOUNTER — Other Ambulatory Visit (HOSPITAL_COMMUNITY): Payer: Self-pay

## 2023-03-19 DIAGNOSIS — R0609 Other forms of dyspnea: Secondary | ICD-10-CM

## 2023-03-21 ENCOUNTER — Telehealth: Payer: Self-pay | Admitting: *Deleted

## 2023-03-27 ENCOUNTER — Ambulatory Visit (HOSPITAL_COMMUNITY)
Admission: RE | Admit: 2023-03-27 | Discharge: 2023-03-27 | Disposition: A | Discharge status: Home / Self Care | Payer: Medicare Other | Source: Ambulatory Visit | Attending: Cardiology | Admitting: Cardiology

## 2023-03-27 DIAGNOSIS — R0609 Other forms of dyspnea: Secondary | ICD-10-CM | POA: Diagnosis not present

## 2023-03-27 MED ORDER — SODIUM CHLORIDE 0.9 % IV SOLN
510.0000 mg | Freq: Once | INTRAVENOUS | Status: AC
  Administered 2023-03-27: 510 mg via INTRAVENOUS
  Filled 2023-03-27: qty 510

## 2023-03-28 ENCOUNTER — Telehealth (HOSPITAL_COMMUNITY): Payer: Self-pay

## 2023-03-28 NOTE — Telephone Encounter (Signed)
Spoke to patient reminding her of procedure scheduled for tomorrow. Aware of nothing to eat or drink after midnight. Someone to drive to and from procedure. Aware of holding Synjardy in the am.

## 2023-03-29 ENCOUNTER — Ambulatory Visit (HOSPITAL_COMMUNITY)
Admission: RE | Admit: 2023-03-29 | Discharge: 2023-03-29 | Disposition: A | Discharge status: Home / Self Care | Payer: Medicare Other | Attending: Cardiology | Admitting: Cardiology

## 2023-03-29 ENCOUNTER — Other Ambulatory Visit: Payer: Self-pay

## 2023-03-29 ENCOUNTER — Encounter (HOSPITAL_COMMUNITY)
Admission: RE | Disposition: A | Discharge status: Home / Self Care | Payer: Self-pay | Source: Home / Self Care | Attending: Cardiology

## 2023-03-29 DIAGNOSIS — I5032 Chronic diastolic (congestive) heart failure: Secondary | ICD-10-CM | POA: Diagnosis not present

## 2023-03-29 DIAGNOSIS — E785 Hyperlipidemia, unspecified: Secondary | ICD-10-CM | POA: Diagnosis not present

## 2023-03-29 DIAGNOSIS — I272 Pulmonary hypertension, unspecified: Secondary | ICD-10-CM | POA: Diagnosis not present

## 2023-03-29 DIAGNOSIS — I2510 Atherosclerotic heart disease of native coronary artery without angina pectoris: Secondary | ICD-10-CM | POA: Diagnosis not present

## 2023-03-29 DIAGNOSIS — Z7982 Long term (current) use of aspirin: Secondary | ICD-10-CM | POA: Insufficient documentation

## 2023-03-29 DIAGNOSIS — R0609 Other forms of dyspnea: Secondary | ICD-10-CM | POA: Diagnosis present

## 2023-03-29 DIAGNOSIS — D509 Iron deficiency anemia, unspecified: Secondary | ICD-10-CM | POA: Diagnosis not present

## 2023-03-29 DIAGNOSIS — I110 Hypertensive heart disease with heart failure: Secondary | ICD-10-CM | POA: Insufficient documentation

## 2023-03-29 DIAGNOSIS — Z79899 Other long term (current) drug therapy: Secondary | ICD-10-CM | POA: Insufficient documentation

## 2023-03-29 DIAGNOSIS — Z87891 Personal history of nicotine dependence: Secondary | ICD-10-CM | POA: Diagnosis not present

## 2023-03-29 DIAGNOSIS — Z7984 Long term (current) use of oral hypoglycemic drugs: Secondary | ICD-10-CM | POA: Insufficient documentation

## 2023-03-29 DIAGNOSIS — I509 Heart failure, unspecified: Secondary | ICD-10-CM

## 2023-03-29 DIAGNOSIS — E119 Type 2 diabetes mellitus without complications: Secondary | ICD-10-CM | POA: Diagnosis not present

## 2023-03-29 HISTORY — PX: RIGHT/LEFT HEART CATH AND CORONARY ANGIOGRAPHY: CATH118266

## 2023-03-29 LAB — GLUCOSE, CAPILLARY: Glucose-Capillary: 113 mg/dL — ABNORMAL HIGH (ref 70–99)

## 2023-03-29 LAB — POCT I-STAT EG7
Acid-base deficit: 1 mmol/L (ref 0.0–2.0)
Acid-base deficit: 2 mmol/L (ref 0.0–2.0)
Bicarbonate: 24.9 mmol/L (ref 20.0–28.0)
Bicarbonate: 26 mmol/L (ref 20.0–28.0)
Calcium, Ion: 1.29 mmol/L (ref 1.15–1.40)
Calcium, Ion: 1.31 mmol/L (ref 1.15–1.40)
HCT: 34 % — ABNORMAL LOW (ref 36.0–46.0)
HCT: 34 % — ABNORMAL LOW (ref 36.0–46.0)
Hemoglobin: 11.6 g/dL — ABNORMAL LOW (ref 12.0–15.0)
Hemoglobin: 11.6 g/dL — ABNORMAL LOW (ref 12.0–15.0)
O2 Saturation: 68 %
O2 Saturation: 68 %
Potassium: 4 mmol/L (ref 3.5–5.1)
Potassium: 4 mmol/L (ref 3.5–5.1)
Sodium: 142 mmol/L (ref 135–145)
Sodium: 143 mmol/L (ref 135–145)
TCO2: 26 mmol/L (ref 22–32)
TCO2: 28 mmol/L (ref 22–32)
pCO2, Ven: 50.2 mmHg (ref 44–60)
pCO2, Ven: 51.2 mmHg (ref 44–60)
pH, Ven: 7.303 (ref 7.25–7.43)
pH, Ven: 7.314 (ref 7.25–7.43)
pO2, Ven: 39 mmHg (ref 32–45)
pO2, Ven: 39 mmHg (ref 32–45)

## 2023-03-29 SURGERY — RIGHT/LEFT HEART CATH AND CORONARY ANGIOGRAPHY
Anesthesia: LOCAL

## 2023-03-29 MED ORDER — SODIUM CHLORIDE 0.9 % IV SOLN: 250.0000 mL | INTRAVENOUS | Status: DC | PRN

## 2023-03-29 MED ORDER — FENTANYL CITRATE (PF) 100 MCG/2ML IJ SOLN
INTRAMUSCULAR | Status: AC
  Filled 2023-03-29: qty 2

## 2023-03-29 MED ORDER — LIDOCAINE HCL (PF) 1 % IJ SOLN
INTRAMUSCULAR | Status: AC
  Filled 2023-03-29: qty 30

## 2023-03-29 MED ORDER — HEPARIN (PORCINE) IN NACL 1000-0.9 UT/500ML-% IV SOLN
INTRAVENOUS | Status: DC | PRN
  Administered 2023-03-29 (×2): 500 mL

## 2023-03-29 MED ORDER — SODIUM CHLORIDE 0.9% FLUSH: 3.0000 mL | Freq: Two times a day (BID) | INTRAVENOUS | Status: DC

## 2023-03-29 MED ORDER — HYDRALAZINE HCL 20 MG/ML IJ SOLN: 10.0000 mg | INTRAMUSCULAR | Status: DC | PRN

## 2023-03-29 MED ORDER — ACETAMINOPHEN 325 MG PO TABS: 650.0000 mg | ORAL_TABLET | ORAL | Status: DC | PRN

## 2023-03-29 MED ORDER — SODIUM CHLORIDE 0.9 % IV SOLN: INTRAVENOUS | Status: DC

## 2023-03-29 MED ORDER — MIDAZOLAM HCL 2 MG/2ML IJ SOLN
INTRAMUSCULAR | Status: AC
  Filled 2023-03-29: qty 2

## 2023-03-29 MED ORDER — HEPARIN SODIUM (PORCINE) 1000 UNIT/ML IJ SOLN
INTRAMUSCULAR | Status: AC
  Filled 2023-03-29: qty 10

## 2023-03-29 MED ORDER — SODIUM CHLORIDE 0.9% FLUSH: 3.0000 mL | INTRAVENOUS | Status: DC | PRN

## 2023-03-29 MED ORDER — LABETALOL HCL 5 MG/ML IV SOLN: 10.0000 mg | INTRAVENOUS | Status: DC | PRN

## 2023-03-29 MED ORDER — HEPARIN SODIUM (PORCINE) 1000 UNIT/ML IJ SOLN
INTRAMUSCULAR | Status: DC | PRN
  Administered 2023-03-29: 4000 [IU] via INTRAVENOUS

## 2023-03-29 MED ORDER — VERAPAMIL HCL 2.5 MG/ML IV SOLN
INTRAVENOUS | Status: DC | PRN
  Administered 2023-03-29: 10 mL via INTRA_ARTERIAL

## 2023-03-29 MED ORDER — IOHEXOL 350 MG/ML SOLN
INTRAVENOUS | Status: DC | PRN
  Administered 2023-03-29: 90 mL

## 2023-03-29 MED ORDER — LIDOCAINE HCL (PF) 1 % IJ SOLN
INTRAMUSCULAR | Status: DC | PRN
  Administered 2023-03-29 (×2): 2 mL via INTRADERMAL

## 2023-03-29 MED ORDER — MIDAZOLAM HCL 2 MG/2ML IJ SOLN
INTRAMUSCULAR | Status: DC | PRN
  Administered 2023-03-29: 1 mg via INTRAVENOUS

## 2023-03-29 MED ORDER — ASPIRIN 81 MG PO CHEW
CHEWABLE_TABLET | ORAL | Status: AC
  Administered 2023-03-29: 81 mg via ORAL
  Filled 2023-03-29: qty 1

## 2023-03-29 MED ORDER — ASPIRIN 81 MG PO CHEW: 81.0000 mg | CHEWABLE_TABLET | Freq: Once | ORAL | Status: AC

## 2023-03-29 MED ORDER — FENTANYL CITRATE (PF) 100 MCG/2ML IJ SOLN
INTRAMUSCULAR | Status: DC | PRN
  Administered 2023-03-29 (×2): 25 ug via INTRAVENOUS

## 2023-03-29 MED ORDER — ONDANSETRON HCL 4 MG/2ML IJ SOLN: 4.0000 mg | Freq: Four times a day (QID) | INTRAMUSCULAR | Status: DC | PRN

## 2023-03-29 MED ORDER — VERAPAMIL HCL 2.5 MG/ML IV SOLN
INTRAVENOUS | Status: AC
  Filled 2023-03-29: qty 2

## 2023-03-29 SURGICAL SUPPLY — 12 items
CATH 5FR JL3.5 JR4 ANG PIG MP (CATHETERS) IMPLANT
CATH BALLN WEDGE 5F 110CM (CATHETERS) IMPLANT
CATH INFINITI 5 FR 3DRC (CATHETERS) IMPLANT
DEVICE RAD COMP TR BAND LRG (VASCULAR PRODUCTS) IMPLANT
DEVICE RAD TR BAND REGULAR (VASCULAR PRODUCTS) IMPLANT
GLIDESHEATH SLEND SS 6F .021 (SHEATH) IMPLANT
GUIDEWIRE INQWIRE 1.5J.035X260 (WIRE) IMPLANT
INQWIRE 1.5J .035X260CM (WIRE) ×1
KIT HEART LEFT (KITS) ×1 IMPLANT
PACK CARDIAC CATHETERIZATION (CUSTOM PROCEDURE TRAY) ×1 IMPLANT
SHEATH GLIDE SLENDER 4/5FR (SHEATH) IMPLANT
TRANSDUCER W/STOPCOCK (MISCELLANEOUS) ×1 IMPLANT

## 2023-03-29 NOTE — Interval H&P Note (Signed)
History and Physical Interval Note:  03/29/2023 10:43 AM  Dawn Rice  has presented today for surgery, with the diagnosis of CHF.  The various methods of treatment have been discussed with the patient and family. After consideration of risks, benefits and other options for treatment, the patient has consented to  Procedure(s): RIGHT/LEFT HEART CATH AND CORONARY ANGIOGRAPHY (N/A) as a surgical intervention.  The patient's history has been reviewed, patient examined, no change in status, stable for surgery.  I have reviewed the patient's chart and labs.  Questions were answered to the patient's satisfaction.     Karinda Cabriales [employer]

## 2023-04-01 ENCOUNTER — Encounter (HOSPITAL_COMMUNITY): Payer: Self-pay | Admitting: Cardiology

## 2023-04-11 ENCOUNTER — Telehealth (HOSPITAL_COMMUNITY): Payer: Self-pay | Admitting: Surgery

## 2023-04-11 NOTE — Telephone Encounter (Signed)
I called patient to let her know that she can proceed with ordered home sleep study and that insurance precert is not needed.  She tells me that she has been dealing with "some family issues" and will do it within the next few days.

## 2023-04-22 ENCOUNTER — Encounter (HOSPITAL_COMMUNITY): Payer: Medicare Other | Admitting: Cardiology

## 2023-05-08 ENCOUNTER — Telehealth (HOSPITAL_COMMUNITY): Payer: Self-pay | Admitting: *Deleted

## 2023-05-08 NOTE — Telephone Encounter (Signed)
Reaching out to patient to offer assistance regarding upcoming cardiac imaging study; pt verbalizes understanding of appt date/time, parking situation and where to check in, and verified current allergies; name and call back number provided for further questions should they arise  Larey Brick RN Navigator Cardiac Imaging [facility] [phone] office [phone] cell  Patient denies metal but is unsure about claustrophobia.

## 2023-05-09 ENCOUNTER — Ambulatory Visit (HOSPITAL_COMMUNITY): Admission: RE | Admit: 2023-05-09 | Payer: Medicare Other | Source: Ambulatory Visit

## 2023-05-27 ENCOUNTER — Ambulatory Visit (HOSPITAL_COMMUNITY)
Admission: RE | Admit: 2023-05-27 | Discharge: 2023-05-27 | Disposition: A | Discharge status: Home / Self Care | Payer: Medicare Other | Source: Ambulatory Visit | Attending: Cardiology | Admitting: Cardiology

## 2023-05-27 VITALS — BP 148/74 | HR 62 | Wt 190.0 lb

## 2023-05-27 DIAGNOSIS — I5032 Chronic diastolic (congestive) heart failure: Secondary | ICD-10-CM | POA: Diagnosis not present

## 2023-05-27 DIAGNOSIS — E785 Hyperlipidemia, unspecified: Secondary | ICD-10-CM | POA: Diagnosis not present

## 2023-05-27 DIAGNOSIS — E119 Type 2 diabetes mellitus without complications: Secondary | ICD-10-CM | POA: Insufficient documentation

## 2023-05-27 DIAGNOSIS — R0602 Shortness of breath: Secondary | ICD-10-CM | POA: Insufficient documentation

## 2023-05-27 DIAGNOSIS — Z7982 Long term (current) use of aspirin: Secondary | ICD-10-CM | POA: Insufficient documentation

## 2023-05-27 DIAGNOSIS — I2510 Atherosclerotic heart disease of native coronary artery without angina pectoris: Secondary | ICD-10-CM | POA: Diagnosis not present

## 2023-05-27 DIAGNOSIS — D509 Iron deficiency anemia, unspecified: Secondary | ICD-10-CM | POA: Insufficient documentation

## 2023-05-27 DIAGNOSIS — I110 Hypertensive heart disease with heart failure: Secondary | ICD-10-CM | POA: Diagnosis not present

## 2023-05-27 DIAGNOSIS — Z79899 Other long term (current) drug therapy: Secondary | ICD-10-CM | POA: Insufficient documentation

## 2023-05-27 MED ORDER — DILTIAZEM HCL ER COATED BEADS 240 MG PO CP24: 240.0000 mg | ORAL_CAPSULE | Freq: Every day | ORAL | 6 refills | Status: DC

## 2023-05-27 NOTE — Patient Instructions (Signed)
Increase Diltiazem CD to 240 mg Daily  Please complete your [facility] Sleep Study Your provider has recommended that you have a home sleep study (Itamar Test).  We have provided you with the equipment in our office today. Please go ahead and download the app. Once you have completed the test you just dispose of the equipment, the information is automatically uploaded to [place] via blue-tooth technology. If your test is positive for sleep apnea and you need a home CPAP machine you will be contacted by Dr Norris Cross office [facility]) to set this up.  Please keep appointment for Cardiac MRI as scheduled 07/24/23  Your physician recommends that you schedule a follow-up appointment in: 3 months  If you have any questions or concerns before your next appointment please send [place] a message through [place] or call our office at [phone].    TO LEAVE A MESSAGE FOR THE NURSE SELECT OPTION 2, PLEASE LEAVE A MESSAGE INCLUDING: YOUR NAME DATE OF BIRTH CALL BACK NUMBER REASON FOR CALL**this is important as we prioritize the call backs  YOU WILL RECEIVE A CALL BACK THE SAME DAY AS LONG AS YOU CALL BEFORE 4:00 PM  At the [facility], you and your health needs are our priority. As part of our continuing mission to provide you with exceptional heart care, we have created designated Provider Care Teams. These Care Teams include your primary Cardiologist (physician) and Advanced Practice Providers (APPs- Physician Assistants and Nurse Practitioners) who all work together to provide you with the care you need, when you need it.   You may see any of the following providers on your designated Care Team at your next follow up: Dr Arvilla Meres Dr Marca Ancona Dr. Marcos Eke, NP Robbie Lis, [place] [facility] [place], [place] Brynda Peon, NP Karle Plumber, PharmD   Please be sure to bring in all your medications bottles to every appointment.    Thank you for  choosing [facility]

## 2023-05-27 NOTE — Progress Notes (Signed)
PCP: Ralene Ok, MD Cardiology: Dr. Servando Salina HF Cardiology: Dr. Shirlee Latch  67 y.o. with history of HTN, hyperlipidemia, and diabetes was referred by Dr. Servando Salina for evaluation of diastolic CHF/pulmonary hypertension.  Patient has had some exertional dyspnea for a year.  I reviewed her 4/23 echo, this showed EF 70-75%, severe LVH with some asymmetry of the basal septum, LV mid cavitary gradient peak 29 at rest and 51 with Valsalva, no SAM, normal RV.  Coronary CTA in 4/23 showed a moderate proximal LCx stenosis that did not appear hemodynamically significant by FFR. Her beta blocker was titrated up with LV mid-cavity gradient.  Stress echo was done in 12/23, showing that LV mid cavity gradient increased from 12 peak to 26 peak with exercise, PASP increased from 30 to 48 with exercise.  No wall motion abnormalities with exercise.   Also of note, patient had Fe deficiency anemia.  She was seen by GI and found to be guaiac negative. No BRBPR/melena.  I gave her IV iron after initial appointment.   Patient had RHC/LHC in 4/24 showing nonobstructive CAD, mildly elevated RA pressure and normal PCWP.   She is generally doing well.  Less exertional dyspnea and fatigue since she had IV Fe.  No orthopnea/PND.  Gets tired/short of breath with moderate-heavy exertion.  No chest pain.  BP elevated today.    ECG (personally reviewed): NSR, LAFB, LVH  Labs (11/23): hgb 7.6, K 4.6, creatinine 0.8 Labs (3/24): BNP 18.5, LDL 69, ferritin 7, transferrin saturation 8%, TSH normal, hgb 9.3, K 4.3, creatinine 0.92  PMH: 1. HTN 2. Type 2 diabetes 3. Hyperlipidemia 4. Epilepsy with h/o seizures. 5. Fe deficiency anemia 6. Remote tobacco use (quit 2009).  7. Chronic diastolic CHF: Echo (4/23) with EF 70-75%, severe LVH with some asymmetry of the basal septum, LV mid cavitary gradient peak 29 at rest and 51 with Valsalva, no SAM, normal RV.  - Stress echo (12/23): LV mid cavity gradient increased from 12 peak to 26 peak with  exercise, PASP increased from 30 to 48 with exercise.  No wall motion abnormalities with exercise.  - RHC (4/24): mean RA 9, PA 31/11 mean 22, mean PCWP 13, CI 3.39 8. CAD: Coronary CTA in 4/23 showed 50-69% proximal LCx stenosis, FFR negative.  - Stress echo in 12/23 as above was negative for ischemia.  - LHC (4/24): Mild nonobstructive CAD.   Social History   Socioeconomic History   Marital status: Widowed    Spouse name: Not on file   Number of children: Not on file   Years of education: Not on file   Highest education level: Not on file  Occupational History   Not on file  Tobacco Use   Smoking status: Former    Packs/day: 0.50    Years: 20.00    Additional pack years: 0.00    Total pack years: 10.00    Types: Cigarettes    Quit date: 2009    Years since quitting: 15.4   Smokeless tobacco: Never  Vaping Use   Vaping Use: Never used  Substance and Sexual Activity   Alcohol use: Yes    Comment: 07/22/2017 "glass of wine maybe once/month"   Drug use: No   Sexual activity: Not on file  Other Topics Concern   Not on file  Social History Narrative   Widowed mother of 4.   Quit smoking in 2008.   Occasional alcohol.   Social Determinants of Health   Financial Resource Strain: Not on  file  Food Insecurity: Not on file  Transportation Needs: Not on file  Physical Activity: Not on file  Stress: Not on file  Social Connections: Not on file  Intimate Partner Violence: Not on file   FH: Mother with "hole in her heart," father with CAD.   ROS: All systems reviewed and negative except as per HPI.   Current Outpatient Medications  Medication Sig Dispense Refill   aspirin EC 81 MG tablet Take 1 tablet (81 mg total) by mouth daily. Swallow whole. 90 tablet 3   Aspirin-Caffeine (BC FAST PAIN RELIEF PO) Take 1 packet by mouth daily as needed (pain).     BELSOMRA 20 MG TABS Take 1 tablet by mouth at bedtime as needed (sleep).     carvedilol (COREG) 25 MG tablet Take 1 tablet  (25 mg total) by mouth 2 (two) times daily. 180 tablet 3   Ketotifen Fumarate (ALLERGY EYE DROPS OP) Place 1 drop into both eyes daily as needed (allergies).     ONETOUCH VERIO test strip U UTD  2   rosuvastatin (CRESTOR) 20 MG tablet Take 1 tablet (20 mg total) by mouth daily. 90 tablet 3   SYNJARDY 12.04-999 MG TABS Take 1 tablet by mouth 2 (two) times daily.  1   diltiazem (CARDIZEM CD) 240 MG 24 hr capsule Take 1 capsule (240 mg total) by mouth daily. 30 capsule 6   No current facility-administered medications for this encounter.   BP (!) 148/74   Pulse 62   Wt 86.2 kg (190 lb)   SpO2 98%   BMI 34.75 kg/m  General: NAD Neck: No JVD, no thyromegaly or thyroid nodule.  Lungs: Clear to auscultation bilaterally with normal respiratory effort. CV: Nondisplaced PMI.  Heart regular S1/S2, no S3/S4, 2/6 SEM RUSB with clear S2.  No peripheral edema.  No carotid bruit.  Normal pedal pulses.  Abdomen: Soft, nontender, no hepatosplenomegaly, no distention.  Skin: Intact without lesions or rashes.  Neurologic: Alert and oriented x 3.  Psych: Normal affect. Extremities: No clubbing or cyanosis.  HEENT: Normal.   Assessment/Plan: 1. Chronic diastolic CHF:  Echo in 4/23 showed EF 70-75%, severe LVH with some asymmetry of the basal septum, LV mid cavitary gradient peak 29 at rest and 51 with Valsalva, no SAM, normal RV.  Coronary CTA in 4/23 showed a moderate proximal LCx stenosis that did not appear hemodynamically significant by FFR. Her beta blocker was titrated up with LV mid-cavity gradient.  Stress echo was done in 12/23, showing that LV mid cavity gradient increased from 12 peak to 26 peak with exercise, PASP increased from 30 to 48 with exercise.  No wall motion abnormalities with exercise.  I reviewed her 4/23 echo.  I am concerned that she could have a form of hypertrophic cardiomyopathy or cardiac amyloidosis. LHC/RHC in 4/24 showed no significant coronary disease and mildly elevated RA  pressure.  She is not volume overloaded on exam.  - She is scheduled for cardiac MRI to assess for evidence of HCM or infiltrative disease.   - Pending sleep study given significant component of fatigue.  - I will use negative inotropy to try to cut down on LV mid-cavity gradient.  She is already on Coreg 25 mg bid.  With elevated BP, I will increase diltiazem CD to 240 mg daily.  - Continue Jardiance.  2. CAD: Coronary CTA in 4/23 with moderate LCx stenosis, not hemodynamically significant by FFR.  LHC in 4/24 showed nonobstructive CAD.  -  Continue Crestor - Continue ASA 81 mg daily.  3. Fe deficiency anemia: This may contribute to her dyspnea/fatigue. She feels better since getting IV Fe.  4. HTN: BP is mildly elevated, increasing diltiazem CD.   Followup in 3 months.  Marca Ancona 05/27/2023

## 2023-06-24 NOTE — Progress Notes (Unsigned)
There were no vitals taken for this visit.   Subjective:    Patient ID: Dawn Rice, female    DOB: 1956/01/23, 67 y.o.   MRN: [id number]  HPI: Dawn Rice is a 67 y.o. female  No chief complaint on file.  Establish care: her last physical was ***.  Medical history includes ***.  Family history includes ***.  Health maintenance ***.   Relevant past medical, surgical, family and social history reviewed and updated as indicated. Interim medical history since our last visit reviewed. Allergies and medications reviewed and updated.  Review of Systems  Constitutional: Negative for fever or weight change.  Respiratory: Negative for cough and shortness of breath.   Cardiovascular: Negative for chest pain or palpitations.  Gastrointestinal: Negative for abdominal pain, no bowel changes.  Musculoskeletal: Negative for gait problem or joint swelling.  Skin: Negative for rash.  Neurological: Negative for dizziness or headache.  No other specific complaints in a complete review of systems (except as listed in HPI above).      Objective:    There were no vitals taken for this visit.  Wt Readings from Last 3 Encounters:  05/27/23 190 lb (86.2 kg)  03/29/23 184 lb (83.5 kg)  03/27/23 184 lb (83.5 kg)    Physical Exam  Constitutional: Patient appears well-developed and well-nourished. Obese *** No distress.  HEENT: head atraumatic, normocephalic, pupils equal and reactive to light, ears ***, neck supple, throat within normal limits Cardiovascular: Normal rate, regular rhythm and normal heart sounds.  No murmur heard. No BLE edema. Pulmonary/Chest: Effort normal and breath sounds normal. No respiratory distress. Abdominal: Soft.  There is no tenderness. Psychiatric: Patient has a normal mood and affect. behavior is normal. Judgment and thought content normal.  Results for orders placed or performed during the hospital encounter of 03/29/23  Glucose, capillary  Result  Value Ref Range   Glucose-Capillary 113 (H) 70 - 99 mg/dL   Comment 1 Notify RN    Comment 2 Document in Chart   POCT I-Stat EG7  Result Value Ref Range   pH, Ven 7.314 7.25 - 7.43   pCO2, Ven 51.2 44 - 60 mmHg   pO2, Ven 39 32 - 45 mmHg   Bicarbonate 26.0 20.0 - 28.0 mmol/L   TCO2 28 22 - 32 mmol/L   O2 Saturation 68 %   Acid-base deficit 1.0 0.0 - 2.0 mmol/L   Sodium 143 135 - 145 mmol/L   Potassium 4.0 3.5 - 5.1 mmol/L   Calcium, Ion 1.29 1.15 - 1.40 mmol/L   HCT 34.0 (L) 36.0 - 46.0 %   Hemoglobin 11.6 (L) 12.0 - 15.0 g/dL   Sample type VENOUS    Comment NOTIFIED PHYSICIAN   POCT I-Stat EG7  Result Value Ref Range   pH, Ven 7.303 7.25 - 7.43   pCO2, Ven 50.2 44 - 60 mmHg   pO2, Ven 39 32 - 45 mmHg   Bicarbonate 24.9 20.0 - 28.0 mmol/L   TCO2 26 22 - 32 mmol/L   O2 Saturation 68 %   Acid-base deficit 2.0 0.0 - 2.0 mmol/L   Sodium 142 135 - 145 mmol/L   Potassium 4.0 3.5 - 5.1 mmol/L   Calcium, Ion 1.31 1.15 - 1.40 mmol/L   HCT 34.0 (L) 36.0 - 46.0 %   Hemoglobin 11.6 (L) 12.0 - 15.0 g/dL   Sample type VENOUS    Comment NOTIFIED PHYSICIAN       Assessment & Plan:  Problem List Items Addressed This Visit   None    Follow up plan: No follow-ups on file.

## 2023-06-25 ENCOUNTER — Encounter: Payer: Self-pay | Admitting: Nurse Practitioner

## 2023-06-25 ENCOUNTER — Other Ambulatory Visit: Payer: Self-pay

## 2023-06-25 ENCOUNTER — Ambulatory Visit (INDEPENDENT_AMBULATORY_CARE_PROVIDER_SITE_OTHER): Payer: Medicare Other | Admitting: Nurse Practitioner

## 2023-06-25 VITALS — BP 162/84 | HR 72 | Temp 98.0°F | Resp 16 | Ht 62.0 in | Wt 187.6 lb

## 2023-06-25 DIAGNOSIS — Z7689 Persons encountering health services in other specified circumstances: Secondary | ICD-10-CM

## 2023-06-25 DIAGNOSIS — E669 Obesity, unspecified: Secondary | ICD-10-CM

## 2023-06-25 DIAGNOSIS — E785 Hyperlipidemia, unspecified: Secondary | ICD-10-CM

## 2023-06-25 DIAGNOSIS — Z114 Encounter for screening for human immunodeficiency virus [HIV]: Secondary | ICD-10-CM

## 2023-06-25 DIAGNOSIS — I10 Essential (primary) hypertension: Secondary | ICD-10-CM

## 2023-06-25 DIAGNOSIS — I509 Heart failure, unspecified: Secondary | ICD-10-CM | POA: Diagnosis not present

## 2023-06-25 DIAGNOSIS — E1165 Type 2 diabetes mellitus with hyperglycemia: Secondary | ICD-10-CM

## 2023-06-25 DIAGNOSIS — I2510 Atherosclerotic heart disease of native coronary artery without angina pectoris: Secondary | ICD-10-CM

## 2023-06-25 DIAGNOSIS — Z1159 Encounter for screening for other viral diseases: Secondary | ICD-10-CM | POA: Diagnosis not present

## 2023-06-25 DIAGNOSIS — Z7984 Long term (current) use of oral hypoglycemic drugs: Secondary | ICD-10-CM | POA: Diagnosis not present

## 2023-06-25 DIAGNOSIS — R569 Unspecified convulsions: Secondary | ICD-10-CM | POA: Diagnosis not present

## 2023-06-25 LAB — CBC WITH DIFFERENTIAL/PLATELET
Basophils Relative: 0.5 %
Eosinophils Absolute: 308 cells/uL (ref 15–500)
Lymphs Abs: 1932 cells/uL (ref 850–3900)
MCHC: 30.4 g/dL — ABNORMAL LOW (ref 32.0–36.0)
MPV: 11 fL (ref 7.5–12.5)
RBC: 5.02 10*6/uL (ref 3.80–5.10)
Total Lymphocyte: 33.9 %

## 2023-06-25 NOTE — Assessment & Plan Note (Signed)
Continue  synjardy 12.5 - 1000 mg daily,

## 2023-06-25 NOTE — Assessment & Plan Note (Signed)
Continue asa 81 mg daily, crestor 20 mg daily, synjardy 12.5 - 1000 mg daily, diltiazem 240 mg daily, coreg 25 mg two times a day. Keep follow up appointments with cardiology 

## 2023-06-25 NOTE — Assessment & Plan Note (Signed)
Continue crestor 20mg daily  

## 2023-06-25 NOTE — Assessment & Plan Note (Signed)
She has not started the diltiazem 240 mg that was recently increased by her cardiologist.  She is also taking coreg 25 mg bid

## 2023-06-25 NOTE — Assessment & Plan Note (Signed)
Increase physical activity as tolerated, eat well balanced diet with portion control

## 2023-06-25 NOTE — Assessment & Plan Note (Signed)
Not currently on medications does not have a neurologist. She wants to hold off on seeing neurology at this time.

## 2023-06-25 NOTE — Assessment & Plan Note (Signed)
Continue asa 81 mg daily, crestor 20 mg daily, synjardy 12.5 - 1000 mg daily, diltiazem 240 mg daily, coreg 25 mg two times a day. Keep follow up appointments with cardiology

## 2023-06-26 LAB — COMPLETE METABOLIC PANEL WITH GFR
AG Ratio: 1.5 (calc) (ref 1.0–2.5)
ALT: 13 U/L (ref 6–29)
AST: 17 U/L (ref 10–35)
Albumin: 4.5 g/dL (ref 3.6–5.1)
Alkaline phosphatase (APISO): 70 U/L (ref 37–153)
BUN: 19 mg/dL (ref 7–25)
CO2: 27 mmol/L (ref 20–32)
Calcium: 9.9 mg/dL (ref 8.6–10.4)
Chloride: 103 mmol/L (ref 98–110)
Creat: 0.84 mg/dL (ref 0.50–1.05)
Globulin: 3 g/dL (calc) (ref 1.9–3.7)
Glucose, Bld: 116 mg/dL — ABNORMAL HIGH (ref 65–99)
Potassium: 4.7 mmol/L (ref 3.5–5.3)
Sodium: 141 mmol/L (ref 135–146)
Total Bilirubin: 0.2 mg/dL (ref 0.2–1.2)
Total Protein: 7.5 g/dL (ref 6.1–8.1)
eGFR: 78 mL/min/{1.73_m2} (ref >=60)

## 2023-06-26 LAB — CBC WITH DIFFERENTIAL/PLATELET
Absolute Monocytes: 462 cells/uL (ref 200–950)
Basophils Absolute: 29 cells/uL (ref 0–200)
Eosinophils Relative: 5.4 %
HCT: 40.1 % (ref 35.0–45.0)
Hemoglobin: 12.2 g/dL (ref 11.7–15.5)
MCH: 24.3 pg — ABNORMAL LOW (ref 27.0–33.0)
MCV: 79.9 fL — ABNORMAL LOW (ref 80.0–100.0)
Monocytes Relative: 8.1 %
Neutro Abs: 2970 cells/uL (ref 1500–7800)
Neutrophils Relative %: 52.1 %
Platelets: 341 10*3/uL (ref 140–400)
RDW: 18.5 % — ABNORMAL HIGH (ref 11.0–15.0)
WBC: 5.7 10*3/uL (ref 3.8–10.8)

## 2023-06-26 LAB — HEMOGLOBIN A1C
Hgb A1c MFr Bld: 7.7 % of total Hgb — ABNORMAL HIGH (ref <5.7)
Mean Plasma Glucose: 174 mg/dL
eAG (mmol/L): 9.7 mmol/L

## 2023-06-26 LAB — MICROALBUMIN / CREATININE URINE RATIO
Creatinine, Urine: 99 mg/dL (ref 20–275)
Microalb Creat Ratio: 240 mg/g creat — ABNORMAL HIGH (ref <30)
Microalb, Ur: 23.8 mg/dL

## 2023-06-26 LAB — LIPID PANEL
Cholesterol: 154 mg/dL (ref <200)
HDL: 55 mg/dL (ref >=50)
LDL Cholesterol (Calc): 79 mg/dL (calc)
Non-HDL Cholesterol (Calc): 99 mg/dL (calc) (ref <130)
Total CHOL/HDL Ratio: 2.8 (calc) (ref <5.0)
Triglycerides: 112 mg/dL (ref <150)

## 2023-06-26 LAB — HIV ANTIBODY (ROUTINE TESTING W REFLEX): HIV 1&2 Ab, 4th Generation: NONREACTIVE

## 2023-06-26 LAB — HEPATITIS C ANTIBODY: Hepatitis C Ab: NONREACTIVE

## 2023-07-01 ENCOUNTER — Ambulatory Visit: Payer: Self-pay

## 2023-07-01 NOTE — Telephone Encounter (Signed)
Chief Complaint: Medication Question Symptoms: None Frequency: None Disposition: [] ED /[] Urgent Care (no appt availability in office) / [] Appointment(In office/virtual)/ []  [facility]/ [] Home Care/ [] Refused Recommended Disposition /[] [facility]/ [x]  Follow-up with PCP Additional Notes: Patient stated she got her lab results last week and was told her A1C was elevated. Patient originally stated that she wanted to try diet and exercise to lower her A1C. She confirms taking Synjardy 12.5/1000mg  daily as ordered. Patient stated if her provider wants to add an additional diabetes medication at this time, she is willing try it. Patient would like new medication to be sent to [facility] [address]. Advised patient I would forward message to provider as requested.  Summary: medication   Pateint called stated she had decided to accept the medication that the provider was going to put her on when she came to her visit last week for diabetes. Please f/u with patient as she did not know the name of the medication.     Reason for Disposition  [1] Caller has NON-URGENT medicine question about med that PCP prescribed AND [2] triager unable to answer question  Answer Assessment - Initial Assessment Questions 1. NAME of MEDICINE: "What medicine(s) are you calling about?"     Not sure  2. QUESTION: "What is your question?" (e.g., double dose of medicine, side effect)     Just wanted to let my provider know I am willing to take additional diabetes medicine if she thinks I need it.  3. PRESCRIBER: "Who prescribed the medicine?" Reason: if prescribed by specialist, call should be referred to that group.     Della Goo, FNP 4. SYMPTOMS: "Do you have any symptoms?" If Yes, ask: "What symptoms are you having?"  "How bad are the symptoms (e.g., mild, moderate, severe)     None  Protocols used: Medication Question Call-A-AH

## 2023-07-02 ENCOUNTER — Other Ambulatory Visit: Payer: Self-pay | Admitting: Nurse Practitioner

## 2023-07-02 DIAGNOSIS — E1165 Type 2 diabetes mellitus with hyperglycemia: Secondary | ICD-10-CM

## 2023-07-02 MED ORDER — RYBELSUS 3 MG PO TABS
3.0000 mg | ORAL_TABLET | Freq: Every day | ORAL | 0 refills | Status: DC
Start: 2023-07-02 — End: 2024-03-10

## 2023-07-17 NOTE — Progress Notes (Deleted)
Name: Dawn Rice   MRN: [id number]    DOB: [birth year]   Date:07/17/2023       Progress Note  Subjective  Chief Complaint  No chief complaint on file.   HPI  Patient presents for annual CPE.  Diet: *** Exercise: ***  Last Eye Exam: *** Last Dental Exam: ***  Flowsheet Row Office Visit from 06/25/2023 in [facility]  AUDIT-C Score 1      Depression: Phq 9 is  {Desc; negative/positive:13464}    06/25/2023    9:59 AM  Depression screen PHQ 2/9  Decreased Interest 0  Down, Depressed, Hopeless 0  PHQ - 2 Score 0   Hypertension: BP Readings from Last 3 Encounters:  06/25/23 (!) 162/84  05/27/23 (!) 148/74  03/29/23 137/70   Obesity: Wt Readings from Last 3 Encounters:  06/25/23 187 lb 9.6 oz (85.1 kg)  05/27/23 190 lb (86.2 kg)  03/29/23 184 lb (83.5 kg)   BMI Readings from Last 3 Encounters:  06/25/23 34.31 kg/m  05/27/23 34.75 kg/m  03/29/23 33.65 kg/m     Vaccines:   HPV:  Tdap:  Shingrix:  Pneumonia:  Flu:  COVID-19:   Hep C Screening:  STD testing and prevention (HIV/chl/gon/syphilis):  Intimate partner violence: {Desc; negative/positive:13464} screen  Sexual History : Menstrual History/LMP/Abnormal Bleeding:  Discussed importance of follow up if any post-menopausal bleeding: {Response; yes/no/na:63}  Incontinence Symptoms: {Desc; negative/positive:13464} for symptoms   Breast cancer:  - Last Mammogram: *** - BRCA gene screening:   Osteoporosis Prevention : Discussed high calcium and vitamin D supplementation, weight bearing exercises Bone density :{Response; yes/no/na:63}   Cervical cancer screening:   Skin cancer: Discussed monitoring for atypical lesions  Colorectal cancer: ***   Lung cancer:  Low Dose CT Chest recommended if Age 76-80 years, 20 pack-year currently smoking OR have quit w/in 15years. Patient {DOES NOT does:27190::"does not"} qualify for screen   ECG: ***  Advanced Care Planning: A  voluntary discussion about advance care planning including the explanation and discussion of advance directives.  Discussed health care proxy and Living will, and the patient was able to identify a health care proxy as ***.  Patient {DOES_DOES YQI:34742} have a living will and power of attorney of health care   Lipids: Lab Results  Component Value Date   CHOL 154 06/25/2023   CHOL 140 03/14/2023   CHOL 169 01/19/2017   Lab Results  Component Value Date   HDL 55 06/25/2023   HDL 49 03/14/2023   HDL 52 01/19/2017   Lab Results  Component Value Date   LDLCALC 79 06/25/2023   LDLCALC 69 03/14/2023   LDLCALC 93 01/19/2017   Lab Results  Component Value Date   TRIG 112 06/25/2023   TRIG 110 03/14/2023   TRIG 120 01/19/2017   Lab Results  Component Value Date   CHOLHDL 2.8 06/25/2023   CHOLHDL 2.9 03/14/2023   CHOLHDL 3.3 01/19/2017   No results found for: "LDLDIRECT"  Glucose: Glucose  Date Value Ref Range Status  11/22/2022 125 (H) 70 - 99 mg/dL Final   Glucose, Bld  Date Value Ref Range Status  06/25/2023 116 (H) 65 - 99 mg/dL Final    Comment:    .            Fasting reference interval . For someone without known diabetes, a glucose value between 100 and 125 mg/dL is consistent with prediabetes and should be confirmed with a follow-up test. .   03/14/2023  125 (H) 70 - 99 mg/dL Final    Comment:    Glucose reference range applies only to samples taken after fasting for at least 8 hours.  08/03/2021 104 (H) 70 - 99 mg/dL Final    Comment:    Glucose reference range applies only to samples taken after fasting for at least 8 hours.   Glucose-Capillary  Date Value Ref Range Status  03/29/2023 113 (H) 70 - 99 mg/dL Final    Comment:    Glucose reference range applies only to samples taken after fasting for at least 8 hours.  07/24/2017 91 65 - 99 mg/dL Final  82/95/6213 79 65 - 99 mg/dL Final    Patient Active Problem List   Diagnosis Date Noted   Chronic  congestive heart failure (HCC) 06/25/2023   Coronary artery disease involving native coronary artery of native heart 09/16/2022   Obesity (BMI 30-39.9) 09/16/2022   House dust mite allergy 07/08/2019   Essential hypertension, benign    Hyperlipidemia 01/19/2017   Diabetes (HCC) 01/19/2017   Seizures (HCC) 01/19/2017   Gout     Past Surgical History:  Procedure Laterality Date   ABDOMINAL HYSTERECTOMY     APPENDECTOMY     CATARACT EXTRACTION     CATARACT EXTRACTION W/ INTRAOCULAR LENS  IMPLANT, BILATERAL Bilateral 2016-2017   right-left   CHOLECYSTECTOMY  03/2009   lap/notes 04/25/2011   LAPAROSCOPIC APPENDECTOMY N/A 07/21/2017   Procedure: APPENDECTOMY LAPAROSCOPIC;  Surgeon: Manus Rudd, MD;  Location: [facility] OR;  Service: General;  Laterality: N/A;   RIGHT/LEFT HEART CATH AND CORONARY ANGIOGRAPHY N/A 03/29/2023   Procedure: RIGHT/LEFT HEART CATH AND CORONARY ANGIOGRAPHY;  Surgeon: Laurey Morale, MD;  Location: [facility] INVASIVE CV LAB;  Service: Cardiovascular;  Laterality: N/A;   TUBAL LIGATION      Family History  Problem Relation Age of Onset   Diabetes Mother    Hypertension Mother    Heart failure Mother    Cataracts Mother    Diabetes Father    Coronary artery disease Father    Retinitis pigmentosa Sister    Amblyopia Neg Hx    Blindness Neg Hx    Glaucoma Neg Hx    Macular degeneration Neg Hx    Retinal detachment Neg Hx    Strabismus Neg Hx     Social History   Socioeconomic History   Marital status: Widowed    Spouse name: Not on file   Number of children: 4   Years of education: Not on file   Highest education level: Not on file  Occupational History   Not on file  Tobacco Use   Smoking status: Former    Current packs/day: 0.00    Average packs/day: 0.5 packs/day for 20.0 years (10.0 ttl pk-yrs)    Types: Cigarettes    Start date: 62    Quit date: 2009    Years since quitting: 15.5   Smokeless tobacco: Never  Vaping Use   Vaping status: Never Used   Substance and Sexual Activity   Alcohol use: Yes    Comment: 07/22/2017 "glass of wine maybe once/month"   Drug use: No   Sexual activity: Not Currently  Other Topics Concern   Not on file  Social History Narrative   Widowed mother of 4.   Quit smoking in 2008.   Occasional alcohol.   Social Determinants of Health   Financial Resource Strain: Not on file  Food Insecurity: Not on file  Transportation Needs: Not on file  Physical Activity: Not on file  Stress: Not on file  Social Connections: Not on file  Intimate Partner Violence: Not on file     Current Outpatient Medications:    aspirin EC 81 MG tablet, Take 1 tablet (81 mg total) by mouth daily. Swallow whole., Disp: 90 tablet, Rfl: 3   Aspirin-Caffeine (BC FAST PAIN RELIEF PO), Take 1 packet by mouth daily as needed (pain)., Disp: , Rfl:    BELSOMRA 20 MG TABS, Take 1 tablet by mouth at bedtime as needed (sleep)., Disp: , Rfl:    carvedilol (COREG) 25 MG tablet, Take 1 tablet (25 mg total) by mouth 2 (two) times daily., Disp: 180 tablet, Rfl: 3   diltiazem (CARDIZEM CD) 240 MG 24 hr capsule, Take 1 capsule (240 mg total) by mouth daily., Disp: 30 capsule, Rfl: 6   Ketotifen Fumarate (ALLERGY EYE DROPS OP), Place 1 drop into both eyes daily as needed (allergies)., Disp: , Rfl:    rosuvastatin (CRESTOR) 20 MG tablet, Take 1 tablet (20 mg total) by mouth daily., Disp: 90 tablet, Rfl: 3   Semaglutide (RYBELSUS) 3 MG TABS, Take 1 tablet (3 mg total) by mouth daily., Disp: 30 tablet, Rfl: 0   SYNJARDY 12.04-999 MG TABS, Take 1 tablet by mouth 2 (two) times daily., Disp: , Rfl: 1  Allergies  Allergen Reactions   Contrave [Naltrexone-Bupropion Hcl Er] Nausea And Vomiting   Metformin And Related Nausea Only   Penicillins Itching    Has patient had a PCN reaction causing immediate rash, facial/tongue/throat swelling, SOB or lightheadedness with hypotension: No Has patient had a PCN reaction causing severe rash involving mucus  membranes or skin necrosis: No Has patient had a PCN reaction that required hospitalization No Has patient had a PCN reaction occurring within the last 10 years: No If all of the above answers are "NO", then may proceed with Cephalosporin use.     ROS  ***  Objective  There were no vitals filed for this visit.  There is no height or weight on file to calculate BMI.  Physical Exam ***  Recent Results (from the past 2160 hour(s))  CBC with Differential/Platelet     Status: Abnormal   Collection Time: 06/25/23 10:19 AM  Result Value Ref Range   WBC 5.7 3.8 - 10.8 Thousand/uL   RBC 5.02 3.80 - 5.10 Million/uL   Hemoglobin 12.2 11.7 - 15.5 g/dL   HCT 84.1 32.4 - 40.1 %   MCV 79.9 (L) 80.0 - 100.0 fL   MCH 24.3 (L) 27.0 - 33.0 pg   MCHC 30.4 (L) 32.0 - 36.0 g/dL   RDW 02.7 (H) 25.3 - 66.4 %   Platelets 341 140 - 400 Thousand/uL   MPV 11.0 7.5 - 12.5 fL   Neutro Abs 2,970 1,500 - 7,800 cells/uL   Lymphs Abs 1,932 850 - 3,900 cells/uL   Absolute Monocytes 462 200 - 950 cells/uL   Eosinophils Absolute 308 15 - 500 cells/uL   Basophils Absolute 29 0 - 200 cells/uL   Neutrophils Relative % 52.1 %   Total Lymphocyte 33.9 %   Monocytes Relative 8.1 %   Eosinophils Relative 5.4 %   Basophils Relative 0.5 %  COMPLETE METABOLIC PANEL WITH GFR     Status: Abnormal   Collection Time: 06/25/23 10:19 AM  Result Value Ref Range   Glucose, Bld 116 (H) 65 - 99 mg/dL    Comment: .            Fasting  reference interval . For someone without known diabetes, a glucose value between 100 and 125 mg/dL is consistent with prediabetes and should be confirmed with a follow-up test. .    BUN 19 7 - 25 mg/dL   Creat 3.47 4.25 - 9.56 mg/dL   eGFR 78 > OR = 60 LO/VFI/4.33I9   BUN/Creatinine Ratio SEE NOTE: 6 - 22 (calc)    Comment:    Not Reported: BUN and Creatinine are within    reference range. .    Sodium 141 135 - 146 mmol/L   Potassium 4.7 3.5 - 5.3 mmol/L   Chloride 103 98 - 110  mmol/L   CO2 27 20 - 32 mmol/L   Calcium 9.9 8.6 - 10.4 mg/dL   Total Protein 7.5 6.1 - 8.1 g/dL   Albumin 4.5 3.6 - 5.1 g/dL   Globulin 3.0 1.9 - 3.7 g/dL (calc)   AG Ratio 1.5 1.0 - 2.5 (calc)   Total Bilirubin 0.2 0.2 - 1.2 mg/dL   Alkaline phosphatase (APISO) 70 37 - 153 U/L   AST 17 10 - 35 U/L   ALT 13 6 - 29 U/L  Lipid panel     Status: None   Collection Time: 06/25/23 10:19 AM  Result Value Ref Range   Cholesterol 154 <200 mg/dL   HDL 55 > OR = 50 mg/dL   Triglycerides 518 <841 mg/dL   LDL Cholesterol (Calc) 79 mg/dL (calc)    Comment: Reference range: <100 . Desirable range <100 mg/dL for primary prevention;   <70 mg/dL for patients with CHD or diabetic patients  with > or = 2 CHD risk factors. Marland Kitchen LDL-C is now calculated using the Martin-Hopkins  calculation, which is a validated novel method providing  better accuracy than the Friedewald equation in the  estimation of LDL-C.  Horald Pollen et al. Lenox Ahr. 6606;301(60): 2061-2068  (http://education.[facility].com/faq/FAQ164)    Total CHOL/HDL Ratio 2.8 <5.0 (calc)   Non-HDL Cholesterol (Calc) 99 <109 mg/dL (calc)    Comment: For patients with diabetes plus 1 major ASCVD risk  factor, treating to a non-HDL-C goal of <100 mg/dL  (LDL-C of <32 mg/dL) is considered a therapeutic  option.   Hemoglobin A1c     Status: Abnormal   Collection Time: 06/25/23 10:19 AM  Result Value Ref Range   Hgb A1c MFr Bld 7.7 (H) <5.7 % of total Hgb    Comment: For someone without known diabetes, a hemoglobin A1c value of 6.5% or greater indicates that they may have  diabetes and this should be confirmed with a follow-up  test. . For someone with known diabetes, a value <7% indicates  that their diabetes is well controlled and a value  greater than or equal to 7% indicates suboptimal  control. A1c targets should be individualized based on  duration of diabetes, age, comorbid conditions, and  other considerations. . Currently, no  consensus exists regarding use of hemoglobin A1c for diagnosis of diabetes for children. .    Mean Plasma Glucose 174 mg/dL   eAG (mmol/L) 9.7 mmol/L    Comment: . This test was performed on the Roche cobas c503 platform. Effective 10/01/22, a change in test platforms from the Abbott Architect to the Roche cobas c503 may have shifted HbA1c results compared to historical results. Based on laboratory validation testing conducted at [facility], the Roche platform relative to the Abbott platform had an average increase in HbA1c value of < or = 0.3%. This difference is within accepted  variability established  by the [facility]. Note that not all individuals will have had a shift in their results and direct comparisons between historical and current results for testing conducted on different platforms is not recommended.   Hepatitis C antibody     Status: None   Collection Time: 06/25/23 10:19 AM  Result Value Ref Range   Hepatitis C Ab NON-REACTIVE NON-REACTIVE    Comment: . HCV antibody was non-reactive. There is no laboratory  evidence of HCV infection. . In most cases, no further action is required. However, if recent HCV exposure is suspected, a test for HCV RNA (test code 47829) is suggested. . For additional information please refer to http://education.[facility].com/faq/FAQ22v1 (This link is being provided for informational/ educational purposes only.) .   HIV Antibody (routine testing w rflx)     Status: None   Collection Time: 06/25/23 10:19 AM  Result Value Ref Range   HIV 1&2 Ab, 4th Generation NON-REACTIVE NON-REACTIVE    Comment: HIV-1 antigen and HIV-1/HIV-2 antibodies were not detected. There is no laboratory evidence of HIV infection. Marland Kitchen PLEASE NOTE: This information has been disclosed to you from records whose confidentiality may be protected by state law.  If your state requires such protection, then the state law  prohibits you from making any further disclosure of the information without the specific written consent of the person to whom it pertains, or as otherwise permitted by law. A general authorization for the release of medical or other information is NOT sufficient for this purpose. . For additional information please refer to http://education.[facility].com/faq/FAQ106 (This link is being provided for informational/ educational purposes only.) . Marland Kitchen The performance of this assay has not been clinically validated in patients less than 64 years old. Marland Kitchen   Microalbumin / creatinine urine ratio     Status: Abnormal   Collection Time: 06/25/23 10:19 AM  Result Value Ref Range   Creatinine, Urine 99 20 - 275 mg/dL   Microalb, Ur 56.2 mg/dL    Comment: Reference Range Not established    Microalb Creat Ratio 240 (H) <30 mg/g creat    Comment: . The ADA defines abnormalities in albumin excretion as follows: Marland Kitchen Albuminuria Category        Result (mg/g creatinine) . Normal to Mildly increased   <30 Moderately increased         30-299  Severely increased           > OR = 300 . The ADA recommends that at least two of three specimens collected within a 3-6 month period be abnormal before considering a patient to be within a diagnostic category.      Fall Risk:    06/25/2023    9:59 AM  Fall Risk   Falls in the past year? 0  Number falls in past yr: 0  Injury with Fall? 0   ***  Functional Status Survey:   ***  Assessment & Plan  There are no diagnoses linked to this encounter.  -USPSTF grade A and B recommendations reviewed with patient; age-appropriate recommendations, preventive care, screening tests, etc discussed and encouraged; healthy living encouraged; see AVS for patient education given to patient -Discussed importance of 150 minutes of physical activity weekly, eat two servings of fish weekly, eat one serving of tree nuts ( cashews, pistachios, pecans,  almonds.Marland Kitchen) every other day, eat 6 servings of fruit/vegetables daily and drink plenty of water and avoid sweet beverages.   -Reviewed Health Maintenance: {yes ZH:[id number]}

## 2023-07-19 ENCOUNTER — Encounter: Payer: Medicare Other | Admitting: Nurse Practitioner

## 2023-07-23 ENCOUNTER — Encounter (HOSPITAL_COMMUNITY): Payer: Self-pay

## 2023-07-23 ENCOUNTER — Telehealth (HOSPITAL_COMMUNITY): Payer: Self-pay | Admitting: Emergency Medicine

## 2023-07-23 NOTE — Telephone Encounter (Signed)
Reaching out to patient to offer assistance regarding upcoming cardiac imaging study; pt verbalizes understanding of appt date/time, parking situation and where to check in, pre-test NPO status and medications ordered, and verified current allergies; name and call back number provided for further questions should they arise Sara Wallace RN Navigator Cardiac Imaging [facility] [phone] office [phone] cell 

## 2023-07-24 ENCOUNTER — Other Ambulatory Visit (HOSPITAL_COMMUNITY): Payer: Self-pay | Admitting: Cardiology

## 2023-07-24 ENCOUNTER — Ambulatory Visit (HOSPITAL_COMMUNITY)
Admission: RE | Admit: 2023-07-24 | Discharge: 2023-07-24 | Disposition: A | Discharge status: Home / Self Care | Payer: Medicare Other | Source: Ambulatory Visit | Attending: Cardiology | Admitting: Cardiology

## 2023-07-24 DIAGNOSIS — I2510 Atherosclerotic heart disease of native coronary artery without angina pectoris: Secondary | ICD-10-CM | POA: Diagnosis not present

## 2023-07-24 MED ORDER — GADOBUTROL 1 MMOL/ML IV SOLN
10.0000 mL | Freq: Once | INTRAVENOUS | Status: AC | PRN
  Administered 2023-07-24: 10 mL via INTRAVENOUS

## 2023-07-24 NOTE — Progress Notes (Unsigned)
Name: Dawn Rice   MRN: [id number]    DOB: [birth year]   Date:07/25/2023       Progress Note  Subjective  Chief Complaint  Chief Complaint  Patient presents with   Annual Exam    HPI  Patient presents for annual CPE.  Diet: Regular, she tries to eat well balanced Exercise: 3 days a week 30 minutes, recommend 150 min of physical activity weekly   Last Eye Exam: January 2024 Last Dental Exam: Due  Flowsheet Row Office Visit from 07/25/2023 in [facility]  AUDIT-C Score 0      Depression: Phq 9 is  negative    07/25/2023    2:02 PM 06/25/2023    9:59 AM  Depression screen PHQ 2/9  Decreased Interest 0 0  Down, Depressed, Hopeless 0 0  PHQ - 2 Score 0 0   Hypertension: BP Readings from Last 3 Encounters:  07/25/23 130/82  06/25/23 (!) 162/84  05/27/23 (!) 148/74   Obesity: Wt Readings from Last 3 Encounters:  07/25/23 187 lb 12.8 oz (85.2 kg)  06/25/23 187 lb 9.6 oz (85.1 kg)  05/27/23 190 lb (86.2 kg)   BMI Readings from Last 3 Encounters:  07/25/23 34.35 kg/m  06/25/23 34.31 kg/m  05/27/23 34.75 kg/m     Vaccines:  HPV: up to at age 67 , ask insurance if age between 58-45  Shingrix: 73-64 yo and ask insurance if covered when patient above 75 yo Pneumonia:  educated and discussed with patient. Flu:  educated and discussed with patient.     Hep C Screening: completed STD testing and prevention (HIV/chl/gon/syphilis): completed Intimate partner violence: negative screen  Sexual History : no Menstrual History/LMP/Abnormal Bleeding: hysterectomy Discussed importance of follow up if any post-menopausal bleeding: no  Incontinence Symptoms: negative for symptoms   Breast cancer:  - Last Mammogram: September 2024, requesting records - BRCA gene screening: none  Osteoporosis Prevention : Discussed high calcium and vitamin D supplementation, weight bearing exercises Bone density :no   Cervical cancer screening: hysterectomy    Skin cancer: Discussed monitoring for atypical lesions  Colorectal cancer: 2022 , requesting records  Lung cancer:  Low Dose CT Chest recommended if Age 5-80 years, 20 pack-year currently smoking OR have quit w/in 15years. Patient does not qualify for screen   ECG:   Advanced Care Planning: A voluntary discussion about advance care planning including the explanation and discussion of advance directives.  Discussed health care proxy and Living will, and the patient was able to identify a health care proxy as kids.  Patient does not have a living will and power of attorney of health care   Lipids: Lab Results  Component Value Date   CHOL 154 06/25/2023   CHOL 140 03/14/2023   CHOL 169 01/19/2017   Lab Results  Component Value Date   HDL 55 06/25/2023   HDL 49 03/14/2023   HDL 52 01/19/2017   Lab Results  Component Value Date   LDLCALC 79 06/25/2023   LDLCALC 69 03/14/2023   LDLCALC 93 01/19/2017   Lab Results  Component Value Date   TRIG 112 06/25/2023   TRIG 110 03/14/2023   TRIG 120 01/19/2017   Lab Results  Component Value Date   CHOLHDL 2.8 06/25/2023   CHOLHDL 2.9 03/14/2023   CHOLHDL 3.3 01/19/2017   No results found for: "LDLDIRECT"  Glucose: Glucose  Date Value Ref Range Status  11/22/2022 125 (H) 70 - 99 mg/dL Final   Glucose,  Bld  Date Value Ref Range Status  06/25/2023 116 (H) 65 - 99 mg/dL Final    Comment:    .            Fasting reference interval . For someone without known diabetes, a glucose value between 100 and 125 mg/dL is consistent with prediabetes and should be confirmed with a follow-up test. .   03/14/2023 125 (H) 70 - 99 mg/dL Final    Comment:    Glucose reference range applies only to samples taken after fasting for at least 8 hours.  08/03/2021 104 (H) 70 - 99 mg/dL Final    Comment:    Glucose reference range applies only to samples taken after fasting for at least 8 hours.   Glucose-Capillary  Date Value Ref Range  Status  03/29/2023 113 (H) 70 - 99 mg/dL Final    Comment:    Glucose reference range applies only to samples taken after fasting for at least 8 hours.  07/24/2017 91 65 - 99 mg/dL Final  40/09/2724 79 65 - 99 mg/dL Final    Patient Active Problem List   Diagnosis Date Noted   Chronic congestive heart failure (HCC) 06/25/2023   Coronary artery disease involving native coronary artery of native heart 09/16/2022   Obesity (BMI 30-39.9) 09/16/2022   House dust mite allergy 07/08/2019   Essential hypertension, benign    Hyperlipidemia 01/19/2017   Diabetes (HCC) 01/19/2017   Seizures (HCC) 01/19/2017   Gout     Past Surgical History:  Procedure Laterality Date   ABDOMINAL HYSTERECTOMY     APPENDECTOMY     CATARACT EXTRACTION     CATARACT EXTRACTION W/ INTRAOCULAR LENS  IMPLANT, BILATERAL Bilateral 2016-2017   right-left   CHOLECYSTECTOMY  03/2009   lap/notes 04/25/2011   LAPAROSCOPIC APPENDECTOMY N/A 07/21/2017   Procedure: APPENDECTOMY LAPAROSCOPIC;  Surgeon: Manus Rudd, MD;  Location: [facility] OR;  Service: General;  Laterality: N/A;   RIGHT/LEFT HEART CATH AND CORONARY ANGIOGRAPHY N/A 03/29/2023   Procedure: RIGHT/LEFT HEART CATH AND CORONARY ANGIOGRAPHY;  Surgeon: Laurey Morale, MD;  Location: [facility] INVASIVE CV LAB;  Service: Cardiovascular;  Laterality: N/A;   TUBAL LIGATION      Family History  Problem Relation Age of Onset   Diabetes Mother    Hypertension Mother    Heart failure Mother    Cataracts Mother    Diabetes Father    Coronary artery disease Father    Retinitis pigmentosa Sister    Amblyopia Neg Hx    Blindness Neg Hx    Glaucoma Neg Hx    Macular degeneration Neg Hx    Retinal detachment Neg Hx    Strabismus Neg Hx     Social History   Socioeconomic History   Marital status: Widowed    Spouse name: Not on file   Number of children: 4   Years of education: Not on file   Highest education level: Not on file  Occupational History   Not on file   Tobacco Use   Smoking status: Former    Current packs/day: 0.00    Average packs/day: 0.5 packs/day for 20.0 years (10.0 ttl pk-yrs)    Types: Cigarettes    Start date: 32    Quit date: 2009    Years since quitting: 15.5   Smokeless tobacco: Never  Vaping Use   Vaping status: Never Used  Substance and Sexual Activity   Alcohol use: Yes    Comment: 07/22/2017 "glass of wine maybe  once/month"   Drug use: No   Sexual activity: Not Currently  Other Topics Concern   Not on file  Social History Narrative   Widowed mother of 4.   Quit smoking in 2008.   Occasional alcohol.   Social Determinants of Health   Financial Resource Strain: Low Risk  (07/25/2023)   Overall Financial Resource Strain (CARDIA)    Difficulty of Paying Living Expenses: Not hard at all  Food Insecurity: No Food Insecurity (07/25/2023)   Hunger Vital Sign    Worried About Running Out of Food in the Last Year: Never true    Ran Out of Food in the Last Year: Never true  Transportation Needs: No Transportation Needs (07/25/2023)   PRAPARE - Administrator, Civil Service (Medical): No    Lack of Transportation (Non-Medical): No  Physical Activity: Insufficiently Active (07/25/2023)   Exercise Vital Sign    Days of Exercise per Week: 3 days    Minutes of Exercise per Session: 30 min  Stress: No Stress Concern Present (07/25/2023)   [employer] of Occupational Health - Occupational Stress Questionnaire    Feeling of Stress : Only a little  Social Connections: Moderately Integrated (07/25/2023)   Social Connection and Isolation Panel [NHANES]    Frequency of Communication with Friends and Family: More than three times a week    Frequency of Social Gatherings with Friends and Family: More than three times a week    Attends Religious Services: More than 4 times per year    Active Member of [employer] or Organizations: Yes    Attends Banker Meetings: 1 to 4 times per year    Marital Status: Widowed   Intimate Partner Violence: Not At Risk (07/25/2023)   Humiliation, Afraid, Rape, and Kick questionnaire    Fear of Current or Ex-Partner: No    Emotionally Abused: No    Physically Abused: No    Sexually Abused: No     Current Outpatient Medications:    aspirin EC 81 MG tablet, Take 1 tablet (81 mg total) by mouth daily. Swallow whole., Disp: 90 tablet, Rfl: 3   Aspirin-Caffeine (BC FAST PAIN RELIEF PO), Take 1 packet by mouth daily as needed (pain)., Disp: , Rfl:    BELSOMRA 20 MG TABS, Take 1 tablet by mouth at bedtime as needed (sleep)., Disp: , Rfl:    carvedilol (COREG) 25 MG tablet, Take 1 tablet (25 mg total) by mouth 2 (two) times daily., Disp: 180 tablet, Rfl: 3   diltiazem (CARDIZEM CD) 240 MG 24 hr capsule, Take 1 capsule (240 mg total) by mouth daily., Disp: 30 capsule, Rfl: 6   Ketotifen Fumarate (ALLERGY EYE DROPS OP), Place 1 drop into both eyes daily as needed (allergies)., Disp: , Rfl:    rosuvastatin (CRESTOR) 20 MG tablet, Take 1 tablet (20 mg total) by mouth daily., Disp: 90 tablet, Rfl: 3   Semaglutide (RYBELSUS) 3 MG TABS, Take 1 tablet (3 mg total) by mouth daily., Disp: 30 tablet, Rfl: 0   SYNJARDY 12.04-999 MG TABS, Take 1 tablet by mouth 2 (two) times daily., Disp: , Rfl: 1  Allergies  Allergen Reactions   Contrave [Naltrexone-Bupropion Hcl Er] Nausea And Vomiting   Metformin And Related Nausea Only   Penicillins Itching    Has patient had a PCN reaction causing immediate rash, facial/tongue/throat swelling, SOB or lightheadedness with hypotension: No Has patient had a PCN reaction causing severe rash involving mucus membranes or skin necrosis: No Has  patient had a PCN reaction that required hospitalization No Has patient had a PCN reaction occurring within the last 10 years: No If all of the above answers are "NO", then may proceed with Cephalosporin use.     ROS  Constitutional: Negative for fever or weight change.  Respiratory: Negative for cough and  shortness of breath.   Cardiovascular: Negative for chest pain or palpitations.  Gastrointestinal: Negative for abdominal pain, no bowel changes.  Musculoskeletal: Negative for gait problem or joint swelling.  Skin: Negative for rash.  Neurological: Negative for dizziness or headache.  No other specific complaints in a complete review of systems (except as listed in HPI above).   Objective  Vitals:   07/25/23 1357  BP: 130/82  Pulse: 83  Resp: 16  Temp: 97.9 F (36.6 C)  TempSrc: Oral  SpO2: 96%  Weight: 187 lb 12.8 oz (85.2 kg)  Height: 5\' 2"  (1.575 m)    Body mass index is 34.35 kg/m.  Physical Exam Constitutional: Patient appears well-developed and well-nourished. No distress.  HENT: Head: Normocephalic and atraumatic. Ears: B TMs ok, no erythema or effusion; Nose: Nose normal. Mouth/Throat: Oropharynx is clear and moist. No oropharyngeal exudate.  Eyes: Conjunctivae and EOM are normal. Pupils are equal, round, and reactive to light. No scleral icterus.  Neck: Normal range of motion. Neck supple. No JVD present. No thyromegaly present.  Cardiovascular: Normal rate, regular rhythm and normal heart sounds.  No murmur heard. No BLE edema. Pulmonary/Chest: Effort normal and breath sounds normal. No respiratory distress. Abdominal: Soft. Bowel sounds are normal, no distension. There is no tenderness. no masses Breast: no lumps or masses, no nipple discharge or rashes Musculoskeletal: Normal range of motion, no joint effusions. No gross deformities Neurological: he is alert and oriented to person, place, and time. No cranial nerve deficit. Coordination, balance, strength, speech and gait are normal.  Skin: Skin is warm and dry. No rash noted. No erythema.  Psychiatric: Patient has a normal mood and affect. behavior is normal. Judgment and thought content normal.   Recent Results (from the past 2160 hour(s))  CBC with Differential/Platelet     Status: Abnormal   Collection Time:  06/25/23 10:19 AM  Result Value Ref Range   WBC 5.7 3.8 - 10.8 Thousand/uL   RBC 5.02 3.80 - 5.10 Million/uL   Hemoglobin 12.2 11.7 - 15.5 g/dL   HCT 09.8 11.9 - 14.7 %   MCV 79.9 (L) 80.0 - 100.0 fL   MCH 24.3 (L) 27.0 - 33.0 pg   MCHC 30.4 (L) 32.0 - 36.0 g/dL   RDW 82.9 (H) 56.2 - 13.0 %   Platelets 341 140 - 400 Thousand/uL   MPV 11.0 7.5 - 12.5 fL   Neutro Abs 2,970 1,500 - 7,800 cells/uL   Lymphs Abs 1,932 850 - 3,900 cells/uL   Absolute Monocytes 462 200 - 950 cells/uL   Eosinophils Absolute 308 15 - 500 cells/uL   Basophils Absolute 29 0 - 200 cells/uL   Neutrophils Relative % 52.1 %   Total Lymphocyte 33.9 %   Monocytes Relative 8.1 %   Eosinophils Relative 5.4 %   Basophils Relative 0.5 %  COMPLETE METABOLIC PANEL WITH GFR     Status: Abnormal   Collection Time: 06/25/23 10:19 AM  Result Value Ref Range   Glucose, Bld 116 (H) 65 - 99 mg/dL    Comment: .            Fasting reference interval . For someone without  known diabetes, a glucose value between 100 and 125 mg/dL is consistent with prediabetes and should be confirmed with a follow-up test. .    BUN 19 7 - 25 mg/dL   Creat 3.29 5.18 - 8.41 mg/dL   eGFR 78 > OR = 60 YS/AYT/0.16W1   BUN/Creatinine Ratio SEE NOTE: 6 - 22 (calc)    Comment:    Not Reported: BUN and Creatinine are within    reference range. .    Sodium 141 135 - 146 mmol/L   Potassium 4.7 3.5 - 5.3 mmol/L   Chloride 103 98 - 110 mmol/L   CO2 27 20 - 32 mmol/L   Calcium 9.9 8.6 - 10.4 mg/dL   Total Protein 7.5 6.1 - 8.1 g/dL   Albumin 4.5 3.6 - 5.1 g/dL   Globulin 3.0 1.9 - 3.7 g/dL (calc)   AG Ratio 1.5 1.0 - 2.5 (calc)   Total Bilirubin 0.2 0.2 - 1.2 mg/dL   Alkaline phosphatase (APISO) 70 37 - 153 U/L   AST 17 10 - 35 U/L   ALT 13 6 - 29 U/L  Lipid panel     Status: None   Collection Time: 06/25/23 10:19 AM  Result Value Ref Range   Cholesterol 154 <200 mg/dL   HDL 55 > OR = 50 mg/dL   Triglycerides 093 <235 mg/dL   LDL  Cholesterol (Calc) 79 mg/dL (calc)    Comment: Reference range: <100 . Desirable range <100 mg/dL for primary prevention;   <70 mg/dL for patients with CHD or diabetic patients  with > or = 2 CHD risk factors. Marland Kitchen LDL-C is now calculated using the Martin-Hopkins  calculation, which is a validated novel method providing  better accuracy than the Friedewald equation in the  estimation of LDL-C.  Horald Pollen et al. Lenox Ahr. 5732;202(54): 2061-2068  (http://education.[facility].com/faq/FAQ164)    Total CHOL/HDL Ratio 2.8 <5.0 (calc)   Non-HDL Cholesterol (Calc) 99 <270 mg/dL (calc)    Comment: For patients with diabetes plus 1 major ASCVD risk  factor, treating to a non-HDL-C goal of <100 mg/dL  (LDL-C of <62 mg/dL) is considered a therapeutic  option.   Hemoglobin A1c     Status: Abnormal   Collection Time: 06/25/23 10:19 AM  Result Value Ref Range   Hgb A1c MFr Bld 7.7 (H) <5.7 % of total Hgb    Comment: For someone without known diabetes, a hemoglobin A1c value of 6.5% or greater indicates that they may have  diabetes and this should be confirmed with a follow-up  test. . For someone with known diabetes, a value <7% indicates  that their diabetes is well controlled and a value  greater than or equal to 7% indicates suboptimal  control. A1c targets should be individualized based on  duration of diabetes, age, comorbid conditions, and  other considerations. . Currently, no consensus exists regarding use of hemoglobin A1c for diagnosis of diabetes for children. .    Mean Plasma Glucose 174 mg/dL   eAG (mmol/L) 9.7 mmol/L    Comment: . This test was performed on the Roche cobas c503 platform. Effective 10/01/22, a change in test platforms from the Abbott Architect to the Roche cobas c503 may have shifted HbA1c results compared to historical results. Based on laboratory validation testing conducted at [facility], the Roche platform relative to the Abbott platform had an average  increase in HbA1c value of < or = 0.3%. This difference is within accepted  variability established by the [employer].  Note that not all individuals will have had a shift in their results and direct comparisons between historical and current results for testing conducted on different platforms is not recommended.   Hepatitis C antibody     Status: None   Collection Time: 06/25/23 10:19 AM  Result Value Ref Range   Hepatitis C Ab NON-REACTIVE NON-REACTIVE    Comment: . HCV antibody was non-reactive. There is no laboratory  evidence of HCV infection. . In most cases, no further action is required. However, if recent HCV exposure is suspected, a test for HCV RNA (test code 13244) is suggested. . For additional information please refer to http://education.[facility].com/faq/FAQ22v1 (This link is being provided for informational/ educational purposes only.) .   HIV Antibody (routine testing w rflx)     Status: None   Collection Time: 06/25/23 10:19 AM  Result Value Ref Range   HIV 1&2 Ab, 4th Generation NON-REACTIVE NON-REACTIVE    Comment: HIV-1 antigen and HIV-1/HIV-2 antibodies were not detected. There is no laboratory evidence of HIV infection. Marland Kitchen PLEASE NOTE: This information has been disclosed to you from records whose confidentiality may be protected by state law.  If your state requires such protection, then the state law prohibits you from making any further disclosure of the information without the specific written consent of the person to whom it pertains, or as otherwise permitted by law. A general authorization for the release of medical or other information is NOT sufficient for this purpose. . For additional information please refer to http://education.[facility].com/faq/FAQ106 (This link is being provided for informational/ educational purposes only.) . Marland Kitchen The performance of this assay has not been  clinically validated in patients less than 36 years old. Marland Kitchen   Microalbumin / creatinine urine ratio     Status: Abnormal   Collection Time: 06/25/23 10:19 AM  Result Value Ref Range   Creatinine, Urine 99 20 - 275 mg/dL   Microalb, Ur 01.0 mg/dL    Comment: Reference Range Not established    Microalb Creat Ratio 240 (H) <30 mg/g creat    Comment: . The ADA defines abnormalities in albumin excretion as follows: Marland Kitchen Albuminuria Category        Result (mg/g creatinine) . Normal to Mildly increased   <30 Moderately increased         30-299  Severely increased           > OR = 300 . The ADA recommends that at least two of three specimens collected within a 3-6 month period be abnormal before considering a patient to be within a diagnostic category.      Fall Risk:    07/25/2023    2:01 PM 06/25/2023    9:59 AM  Fall Risk   Falls in the past year? 0 0  Number falls in past yr: 0 0  Injury with Fall? 0 0     Functional Status Survey: Is the patient deaf or have difficulty hearing?: No Does the patient have difficulty seeing, even when wearing glasses/contacts?: No Does the patient have difficulty concentrating, remembering, or making decisions?: No Does the patient have difficulty walking or climbing stairs?: No Does the patient have difficulty dressing or bathing?: No Does the patient have difficulty doing errands alone such as visiting a doctor's office or shopping?: No   Assessment & Plan  1. Annual physical exam Labs recently done Continue to work on lifestyle modification Eating well balanced Increasing physical activity Requesting records for health maintenance, including colonoscopy, mammogram   -  USPSTF grade A and B recommendations reviewed with patient; age-appropriate recommendations, preventive care, screening tests, etc discussed and encouraged; healthy living encouraged; see AVS for patient education given to patient -Discussed importance of 150 minutes of  physical activity weekly, eat two servings of fish weekly, eat one serving of tree nuts ( cashews, pistachios, pecans, almonds.Marland Kitchen) every other day, eat 6 servings of fruit/vegetables daily and drink plenty of water and avoid sweet beverages.   -Reviewed Health Maintenance: Yes.

## 2023-07-25 ENCOUNTER — Other Ambulatory Visit: Payer: Self-pay

## 2023-07-25 ENCOUNTER — Ambulatory Visit (INDEPENDENT_AMBULATORY_CARE_PROVIDER_SITE_OTHER): Payer: Medicare Other | Admitting: Nurse Practitioner

## 2023-07-25 ENCOUNTER — Encounter: Payer: Self-pay | Admitting: Nurse Practitioner

## 2023-07-25 ENCOUNTER — Telehealth (HOSPITAL_COMMUNITY): Payer: Self-pay

## 2023-07-25 VITALS — BP 130/82 | HR 83 | Temp 97.9°F | Resp 16 | Ht 62.0 in | Wt 187.8 lb

## 2023-07-25 DIAGNOSIS — Z0000 Encounter for general adult medical examination without abnormal findings: Secondary | ICD-10-CM | POA: Diagnosis not present

## 2023-07-25 NOTE — Telephone Encounter (Addendum)
Pt aware, agreeable, and verbalized understanding   ----- Message from Marca Ancona sent at 07/25/2023 10:25 AM EDT ----- This patient could have a variant of hypertrophic cardiomyopathy, but this study does not suggest a high risk variant (no delayed enhancement, septum not markedly thickened)

## 2023-08-21 DIAGNOSIS — L209 Atopic dermatitis, unspecified: Secondary | ICD-10-CM | POA: Diagnosis not present

## 2023-08-21 DIAGNOSIS — L710 Perioral dermatitis: Secondary | ICD-10-CM | POA: Diagnosis not present

## 2023-08-22 ENCOUNTER — Ambulatory Visit (INDEPENDENT_AMBULATORY_CARE_PROVIDER_SITE_OTHER): Payer: Medicare Other

## 2023-08-22 VITALS — Ht 62.0 in | Wt 187.0 lb

## 2023-08-22 DIAGNOSIS — Z0000 Encounter for general adult medical examination without abnormal findings: Secondary | ICD-10-CM

## 2023-08-22 NOTE — Progress Notes (Signed)
Subjective:   Dawn Rice is a 67 y.o. female who presents for an Initial Medicare Annual Wellness Visit.  Visit Complete: Virtual  I connected with  Dawn Rice on 08/22/23 by a audio enabled telemedicine application and verified that I am speaking with the correct person using two identifiers.  Patient Location: Home  Provider Location: Office/Clinic  I discussed the limitations of evaluation and management by telemedicine. The patient expressed understanding and agreed to proceed.  Vital Signs: Unable to obtain new vitals due to this being a telehealth visit.  Patient Medicare AWV questionnaire was completed by the patient on (not done); I have confirmed that all information answered by patient is correct and no changes since this date.  Review of Systems    Cardiac Risk Factors include: advanced age (>26men, >75 women);diabetes mellitus;dyslipidemia;hypertension;obesity (BMI >30kg/m2);sedentary lifestyle    Objective:    Today's Vitals   08/22/23 1016  Weight: 187 lb (84.8 kg)  Height: 5\' 2"  (1.575 m)   Body mass index is 34.2 kg/m.     08/22/2023   10:29 AM 03/29/2023    9:25 AM 08/03/2021    5:42 PM 01/18/2020    2:06 PM 03/04/2018   11:45 AM 02/18/2018    4:39 PM 09/02/2017    6:26 AM  Advanced Directives  Does Patient Have a Medical Advance Directive? No No No No No No No  Would patient like information on creating a medical advance directive?  No - Patient declined  No - Patient declined No - Patient declined  Yes (ED - Information included in AVS)    Current Medications (verified) Outpatient Encounter Medications as of 08/22/2023  Medication Sig   aspirin EC 81 MG tablet Take 1 tablet (81 mg total) by mouth daily. Swallow whole.   Aspirin-Caffeine (BC FAST PAIN RELIEF PO) Take 1 packet by mouth daily as needed (pain).   BELSOMRA 20 MG TABS Take 1 tablet by mouth at bedtime as needed (sleep).   carvedilol (COREG) 25 MG tablet Take 1 tablet (25 mg  total) by mouth 2 (two) times daily.   diltiazem (CARDIZEM CD) 240 MG 24 hr capsule Take 1 capsule (240 mg total) by mouth daily.   Ketotifen Fumarate (ALLERGY EYE DROPS OP) Place 1 drop into both eyes daily as needed (allergies).   rosuvastatin (CRESTOR) 20 MG tablet Take 1 tablet (20 mg total) by mouth daily.   Semaglutide (RYBELSUS) 3 MG TABS Take 1 tablet (3 mg total) by mouth daily.   SYNJARDY 12.04-999 MG TABS Take 1 tablet by mouth 2 (two) times daily.   No facility-administered encounter medications on file as of 08/22/2023.    Allergies (verified) Contrave [naltrexone-bupropion hcl er], Metformin and related, and Penicillins   History: Past Medical History:  Diagnosis Date   Cataract    Epilepsy (HCC)    Gout    History of blood transfusion    "when I got my hysterectomy" (07/22/2017)   Hyperlipidemia    Hypertension    Seizures (HCC)    "from epilepsy; the kind where I space out; last one was ~ 1 yr ago" (07/22/2017)   Type II diabetes mellitus (HCC)    Past Surgical History:  Procedure Laterality Date   ABDOMINAL HYSTERECTOMY     APPENDECTOMY     CATARACT EXTRACTION     CATARACT EXTRACTION W/ INTRAOCULAR LENS  IMPLANT, BILATERAL Bilateral 2016-2017   right-left   CHOLECYSTECTOMY  03/2009   lap/notes 04/25/2011   LAPAROSCOPIC APPENDECTOMY N/A  07/21/2017   Procedure: APPENDECTOMY LAPAROSCOPIC;  Surgeon: Manus Rudd, MD;  Location: [facility] OR;  Service: General;  Laterality: N/A;   RIGHT/LEFT HEART CATH AND CORONARY ANGIOGRAPHY N/A 03/29/2023   Procedure: RIGHT/LEFT HEART CATH AND CORONARY ANGIOGRAPHY;  Surgeon: Laurey Morale, MD;  Location: [facility];  Service: Cardiovascular;  Laterality: N/A;   TUBAL LIGATION     Family History  Problem Relation Age of Onset   Diabetes Mother    Hypertension Mother    Heart failure Mother    Cataracts Mother    Diabetes Father    Coronary artery disease Father    Retinitis pigmentosa Sister    Amblyopia Neg Hx     Blindness Neg Hx    Glaucoma Neg Hx    Macular degeneration Neg Hx    Retinal detachment Neg Hx    Strabismus Neg Hx    Social History   Socioeconomic History   Marital status: Widowed    Spouse name: Not on file   Number of children: 4   Years of education: Not on file   Highest education level: Not on file  Occupational History   Not on file  Tobacco Use   Smoking status: Former    Current packs/day: 0.00    Average packs/day: 0.5 packs/day for 20.0 years (10.0 ttl pk-yrs)    Types: Cigarettes    Start date: 26    Quit date: 2009    Years since quitting: 15.6   Smokeless tobacco: Never  Vaping Use   Vaping status: Never Used  Substance and Sexual Activity   Alcohol use: Yes    Comment: 07/22/2017 "glass of wine maybe once/month"   Drug use: No   Sexual activity: Not Currently  Other Topics Concern   Not on file  Social History Narrative   Widowed mother of 4.   Quit smoking in 2008.   Occasional alcohol.   Social Determinants of Health   Financial Resource Strain: Low Risk  (08/22/2023)   Overall Financial Resource Strain (CARDIA)    Difficulty of Paying Living Expenses: Not hard at all  Food Insecurity: No Food Insecurity (08/22/2023)   Hunger Vital Sign    Worried About Running Out of Food in the Last Year: Never true    Ran Out of Food in the Last Year: Never true  Transportation Needs: No Transportation Needs (08/22/2023)   PRAPARE - Administrator, Civil Service (Medical): No    Lack of Transportation (Non-Medical): No  Physical Activity: Inactive (08/22/2023)   Exercise Vital Sign    Days of Exercise per Week: 0 days    Minutes of Exercise per Session: 0 min  Stress: Stress Concern Present (08/22/2023)   [employer] of Occupational Health - Occupational Stress Questionnaire    Feeling of Stress : To some extent  Social Connections: Moderately Integrated (08/22/2023)   Social Connection and Isolation Panel [NHANES]    Frequency of  Communication with Friends and Family: More than three times a week    Frequency of Social Gatherings with Friends and Family: Once a week    Attends Religious Services: More than 4 times per year    Active Member of [employer] or Organizations: Yes    Attends Banker Meetings: 1 to 4 times per year    Marital Status: Widowed    Tobacco Counseling Counseling given: Not Answered   Clinical Intake:  Pre-visit preparation completed: Yes  Pain : No/denies pain  BMI - recorded: 34.2 Nutritional Status: BMI > 30  Obese Nutritional Risks: None Diabetes: Yes CBG done?: No Did pt. bring in CBG monitor from home?: No  How often do you need to have someone help you when you read instructions, pamphlets, or other written materials from your doctor or pharmacy?: 1 - Never  Interpreter Needed?: No  Comments: lives alone Information entered by :: B.Eutha Cude,LPN   Activities of Daily Living    08/22/2023   10:31 AM 07/25/2023    2:01 PM  In your present state of health, do you have any difficulty performing the following activities:  Hearing? 0 0  Vision? 0 0  Difficulty concentrating or making decisions? 0 0  Walking or climbing stairs? 0 0  Dressing or bathing? 0 0  Doing errands, shopping? 0 0  Preparing Food and eating ? N   Using the Toilet? N   In the past six months, have you accidently leaked urine? N   Do you have problems with loss of bowel control? N   Managing your Medications? N   Managing your Finances? N   Housekeeping or managing your Housekeeping? N     Patient Care Team: Berniece Salines, FNP as PCP - General (Nurse Practitioner) Thomasene Ripple, DO as PCP - Cardiology (Cardiology)  Indicate any recent Medical Services you may have received from other than [facility] providers in the past year (date may be approximate).     Assessment:   This is a routine wellness examination for [place].  Hearing/Vision screen Hearing Screening - Comments:: Adequate  hearing Vision Screening - Comments:: Adequate vision with glasses Dr Dione Booze  Dietary issues and exercise activities discussed:     Goals Addressed             This Visit's Progress    HEMOGLOBIN A1C < 7       Pt wants to get A1C down below 6       Depression Screen    08/22/2023   10:23 AM 07/25/2023    2:02 PM 06/25/2023    9:59 AM  PHQ 2/9 Scores  PHQ - 2 Score 0 0 0    Fall Risk    08/22/2023   10:18 AM 07/25/2023    2:01 PM 06/25/2023    9:59 AM  Fall Risk   Falls in the past year? 0 0 0  Number falls in past yr: 0 0 0  Injury with Fall? 0 0 0  Risk for fall due to : No Fall Risks    Follow up Education provided;Falls prevention discussed      MEDICARE RISK AT HOME: Medicare Risk at Home Any stairs in or around the home?: Yes If so, are there any without handrails?: Yes Home free of loose throw rugs in walkways, pet beds, electrical cords, etc?: Yes Adequate lighting in your home to reduce risk of falls?: Yes Life alert?: No Use of a cane, walker or w/c?: No Grab bars in the bathroom?: No Shower chair or bench in shower?: No Elevated toilet seat or a handicapped toilet?: No  TIMED UP AND GO:  Was the test performed? No    Cognitive Function:        08/22/2023   10:33 AM  6CIT Screen  What Year? 0 points  What month? 0 points  What time? 0 points  Count back from 20 0 points  Months in reverse 0 points  Repeat phrase 0 points  Total Score 0 points  Immunizations Immunization History  Administered Date(s) Administered   Influenza,inj,Quad PF,6+ Mos 10/24/2021   PFIZER(Purple Top)SARS-COV-2 Vaccination 03/26/2020, 05/02/2020    TDAP status: Due, Education has been provided regarding the importance of this vaccine. Advised may receive this vaccine at local pharmacy or Health Dept. Aware to provide a copy of the vaccination record if obtained from local pharmacy or Health Dept. Verbalized acceptance and understanding.  Flu Vaccine status: Up  to date  Covid-19 vaccine status: Completed vaccines  Qualifies for Shingles Vaccine? Yes   Zostavax completed No   Shingrix Completed?: No.    Education has been provided regarding the importance of this vaccine. Patient has been advised to call insurance company to determine out of pocket expense if they have not yet received this vaccine. Advised may also receive vaccine at local pharmacy or Health Dept. Verbalized acceptance and understanding.  Screening Tests Health Maintenance  Topic Date Due   OPHTHALMOLOGY EXAM  Never done   DTaP/Tdap/Td (1 - Tdap) Never done   Colonoscopy  Never done   MAMMOGRAM  Never done   COVID-19 Vaccine (3 - Pfizer risk series) 05/30/2020   INFLUENZA VACCINE  07/25/2023   Zoster Vaccines- Shingrix (1 of 2) 09/25/2023 (Originally 11/29/1975)   HEMOGLOBIN A1C  12/26/2023   Diabetic kidney evaluation - eGFR measurement  06/24/2024   Diabetic kidney evaluation - Urine ACR  06/24/2024   FOOT EXAM  06/24/2024   Medicare Annual Wellness (AWV)  08/21/2024   Hepatitis C Screening  Completed   HIV Screening  Completed   HPV VACCINES  Aged Out   PAP SMEAR-Modifier  Discontinued    Health Maintenance  Health Maintenance Due  Topic Date Due   OPHTHALMOLOGY EXAM  Never done   DTaP/Tdap/Td (1 - Tdap) Never done   Colonoscopy  Never done   MAMMOGRAM  Never done   COVID-19 Vaccine (3 - Pfizer risk series) 05/30/2020   INFLUENZA VACCINE  07/25/2023    Colorectal cancer screening: Type of screening: Colonoscopy. Completed yes. Repeat every 5 years  Mammogram status: Completed yes. Repeat every year scheduled for Sept 2024 already  Lung Cancer Screening: (Low Dose CT Chest recommended if Age 28-80 years, 20 pack-year currently smoking OR have quit w/in 15years.) does not qualify.   Lung Cancer Screening Referral: no  Additional Screening:  Hepatitis C Screening: does not qualify; Completed yes  Vision Screening: Recommended annual ophthalmology exams  for early detection of glaucoma and other disorders of the eye. Is the patient up to date with their annual eye exam?  Yes  Who is the provider or what is the name of the office in which the patient attends annual eye exams? Dr Dione Booze If pt is not established with a provider, would they like to be referred to a provider to establish care? No .   Dental Screening: Recommended annual dental exams for proper oral hygiene  Diabetic Foot Exam: Diabetic Foot Exam: Completed yes  Community Resource Referral / Chronic Care Management: CRR required this visit?  No   CCM required this visit?  No    Plan:     I have personally reviewed and noted the following in the patient's chart:   Medical and social history Use of alcohol, tobacco or illicit drugs  Current medications and supplements including opioid prescriptions. Patient is not currently taking opioid prescriptions. Functional ability and status Nutritional status Physical activity Advanced directives List of other physicians Hospitalizations, surgeries, and ER visits in previous 12 months Vitals Screenings to  include cognitive, depression, and falls Referrals and appointments  In addition, I have reviewed and discussed with patient certain preventive protocols, quality metrics, and best practice recommendations. A written personalized care plan for preventive services as well as general preventive health recommendations were provided to patient.    Sue Lush, LPN   0/34/7425   After Visit Summary: (MyChart) Due to this being a telephonic visit, the after visit summary with patients personalized plan was offered to patient via MyChart   Nurse Notes: Pt states she is doing good other than having irregular sleep patterns. She sleeps four hours then wakes up and cannot get back to sleep. She would like to discuss how to regulate her sleep patterns at next visit. Pt also relays the co-pays on her medications are too expensive  and she needs help paying for them or affordable options

## 2023-08-22 NOTE — Patient Instructions (Signed)
Ms. Kowatch , Thank you for taking time to come for your Medicare Wellness Visit. I appreciate your ongoing commitment to your health goals. Please review the following plan we discussed and let me know if I can assist you in the future.   Referrals/Orders/Follow-Ups/Clinician Recommendations: none  This is a list of the screening recommended for you and due dates:  Health Maintenance  Topic Date Due   Eye exam for diabetics  Never done   DTaP/Tdap/Td vaccine (1 - Tdap) Never done   Colon Cancer Screening  Never done   Mammogram  Never done   COVID-19 Vaccine (3 - Pfizer risk series) 05/30/2020   Flu Shot  07/25/2023   Zoster (Shingles) Vaccine (1 of 2) 09/25/2023*   Hemoglobin A1C  12/26/2023   Yearly kidney function blood test for diabetes  06/24/2024   Yearly kidney health urinalysis for diabetes  06/24/2024   Complete foot exam   06/24/2024   Medicare Annual Wellness Visit  08/21/2024   Hepatitis C Screening  Completed   HIV Screening  Completed   HPV Vaccine  Aged Out   Pap Smear  Discontinued  *Topic was postponed. The date shown is not the original due date.    Advanced directives: (Declined) Advance directive discussed with you today. Even though you declined this today, please call our office should you change your mind, and we can give you the proper paperwork for you to fill out.  Next Medicare Annual Wellness Visit scheduled for next year: Yes 08/27/24 2 10:30am telephone

## 2023-08-28 DIAGNOSIS — S0502XA Injury of conjunctiva and corneal abrasion without foreign body, left eye, initial encounter: Secondary | ICD-10-CM | POA: Diagnosis not present

## 2023-08-29 ENCOUNTER — Inpatient Hospital Stay (HOSPITAL_COMMUNITY): Admission: RE | Admit: 2023-08-29 | Payer: Medicare Other | Source: Ambulatory Visit | Admitting: Cardiology

## 2023-08-29 DIAGNOSIS — S0502XD Injury of conjunctiva and corneal abrasion without foreign body, left eye, subsequent encounter: Secondary | ICD-10-CM | POA: Diagnosis not present

## 2023-09-10 DIAGNOSIS — Z1231 Encounter for screening mammogram for malignant neoplasm of breast: Secondary | ICD-10-CM | POA: Diagnosis not present

## 2023-09-10 LAB — HM MAMMOGRAPHY

## 2023-09-11 ENCOUNTER — Encounter: Payer: Self-pay | Admitting: Nurse Practitioner

## 2023-09-12 ENCOUNTER — Telehealth (HOSPITAL_COMMUNITY): Payer: Self-pay

## 2023-09-12 NOTE — Telephone Encounter (Signed)
Called patient and informed her to complete her Itamar test before 09/20/23. If she is unable to complete it by then I asked her to return it to the office otherwise she will be charged for the device.

## 2023-09-16 ENCOUNTER — Encounter (INDEPENDENT_AMBULATORY_CARE_PROVIDER_SITE_OTHER): Payer: Medicare Other | Admitting: Cardiology

## 2023-09-16 DIAGNOSIS — G4733 Obstructive sleep apnea (adult) (pediatric): Secondary | ICD-10-CM

## 2023-09-17 ENCOUNTER — Ambulatory Visit: Payer: Medicare Other | Attending: Cardiology

## 2023-09-17 ENCOUNTER — Ambulatory Visit: Payer: Medicare Other | Attending: Nurse Practitioner

## 2023-09-17 ENCOUNTER — Other Ambulatory Visit: Payer: Self-pay | Admitting: Nurse Practitioner

## 2023-09-17 DIAGNOSIS — I499 Cardiac arrhythmia, unspecified: Secondary | ICD-10-CM

## 2023-09-17 DIAGNOSIS — I272 Pulmonary hypertension, unspecified: Secondary | ICD-10-CM

## 2023-09-17 NOTE — Progress Notes (Signed)
Patient recently had a sleep study which showed patient may have had an arrhythmia, suggested of atrial fibrillation, will get zio patch.

## 2023-09-17 NOTE — Procedures (Signed)
Patient Information Study Date: 09/16/2023 Patient Name: Dawn Rice Patient ID: [id number] Birth Date: [birth year] Age: 67 Gender: Female BMI: 34.5 (W=187 lb, H=5' 2'') Stopbang: 4 Referring Physician: Marca Ancona, MD  TEST DESCRIPTION: Home sleep apnea testing was completed using the WatchPat, a Type 1 device, utilizing peripheral arterial tonometry (PAT), chest movement, actigraphy, pulse oximetry, pulse rate, body position and snore. AHI was calculated with apnea and hypopnea using valid sleep time as the denominator. RDI includes apneas, hypopneas, and RERAs. The data acquired and the scoring of sleep and all associated events were performed in accordance with the recommended standards and specifications as outlined in the AASM Manual for the Scoring of Sleep and Associated Events 2.2.0 (2015).   FINDINGS:   1. Mild Obstructive Sleep Apnea with AHI 9.5/hr.   2. No Central Sleep Apnea with pAHIc 1.5/hr.   3. Oxygen desaturations as low as 83%.   4. Severe snoring was present. O2 sats were < 88% for 0 min.   5. Total sleep time was 4 hrs and 25 min.   6. 10% of total sleep time was spent in REM sleep.   7. Normal sleep onset latency at 23 min.   8. Prolonged REM sleep onset latency at 463 min.   9. Total awakenings were 34.  10. Arrhythmia detection:  Suggestive of possible brief atrial fibrillation lasting 41 seconds.  This is not diagnostic and further testing with outpatient telemetry monitoring is recommended.  DIAGNOSIS: Mild Obstructive Sleep Apnea (G47.33) Possible Atrial Fibrillation  RECOMMENDATIONS:   1.  Clinical correlation of these findings is necessary.  The decision to treat obstructive sleep apnea (OSA) is usually based on the presence of apnea symptoms or the presence of associated medical conditions such as Hypertension, Congestive Heart Failure, Atrial Fibrillation or Obesity.  The most common symptoms of OSA are snoring, gasping for breath while  sleeping, daytime sleepiness and fatigue.   2.  Initiating apnea therapy is recommended given the presence of symptoms and/or associated conditions. Recommend proceeding with one of the following:     a.  Auto-CPAP therapy with a pressure range of 5-20cm H2O.     b.  An oral appliance (OA) that can be obtained from certain dentists with expertise in sleep medicine.  These are primarily of use in non-obese patients with mild and moderate disease.     c.  An ENT consultation which may be useful to look for specific causes of obstruction and possible treatment options.     d.  If patient is intolerant to PAP therapy, consider referral to ENT for evaluation for hypoglossal nerve stimulator.   3.  Close follow-up is necessary to ensure success with CPAP or oral appliance therapy for maximum benefit.  4.  A follow-up oximetry study on CPAP is recommended to assess the adequacy of therapy and determine the need for supplemental oxygen or the potential need for Bi-level therapy.  An arterial blood gas to determine the adequacy of baseline ventilation and oxygenation should also be considered.  5.  Healthy sleep recommendations include:  adequate nightly sleep (normal 7-9 hrs/night), avoidance of caffeine after noon and alcohol near bedtime, and maintaining a sleep environment that is cool, dark and quiet.  6.  Weight loss for overweight patients is recommended.  Even modest amounts of weight loss can significantly improve the severity of sleep apnea.  7.  Snoring recommendations include:  weight loss where appropriate, side sleeping, and avoidance of alcohol before bed.  8.  Operation of motor vehicle should be avoided when sleepy.  Signature: Armanda Magic, MD; [facility]; Diplomat, American Board of Sleep Medicine Electronically Signed: 09/17/2023 9:27:05 AM

## 2023-09-18 DIAGNOSIS — L209 Atopic dermatitis, unspecified: Secondary | ICD-10-CM | POA: Diagnosis not present

## 2023-09-18 DIAGNOSIS — L710 Perioral dermatitis: Secondary | ICD-10-CM | POA: Diagnosis not present

## 2023-09-23 ENCOUNTER — Telehealth: Payer: Self-pay | Admitting: *Deleted

## 2023-09-23 DIAGNOSIS — I5032 Chronic diastolic (congestive) heart failure: Secondary | ICD-10-CM

## 2023-09-23 DIAGNOSIS — G4733 Obstructive sleep apnea (adult) (pediatric): Secondary | ICD-10-CM

## 2023-09-23 DIAGNOSIS — I2510 Atherosclerotic heart disease of native coronary artery without angina pectoris: Secondary | ICD-10-CM

## 2023-09-23 NOTE — Telephone Encounter (Signed)
The patient has been notified of the result and verbalized understanding.  All questions (if any) were answered. Latrelle Dodrill, CMA 09/23/2023 6:30 PM    Upon patient request DME selection is [facility]. Patient understands he will be contacted by [facility] to set up his cpap. Patient understands to call if [facility] does not contact him with new setup in a timely manner. Patient understands they will be called once confirmation has been received from [facility]/ that they have received their new machine to schedule 10 week follow up appointment.   [facility] notified of new cpap order  Please add to airview Patient was grateful for the call and thanked me.

## 2023-09-23 NOTE — Telephone Encounter (Signed)
-----   Message from Armanda Magic sent at 09/17/2023  9:28 AM EDT ----- Please let patient know that they have sleep apnea and recommend treating with CPAP.  Please order an auto CPAP from 4-15cm H2O with heated humidity and mask of choice.  Order overnight pulse ox on CPAP.  Followup with me in 6 weeks.

## 2023-09-27 ENCOUNTER — Telehealth: Payer: Self-pay | Admitting: Cardiology

## 2023-09-27 NOTE — Telephone Encounter (Signed)
Caitlin with [facility] is following up regarding a standard written order for CPAP supplies that was faxed yesterday around 2:13 PM. She would like to confirm received + discuss any updates.

## 2023-10-02 NOTE — Telephone Encounter (Signed)
Returned call: Called and spoke with Carroll at [employer] and received the fax for the provider to sign and will fax it back to [phone].

## 2023-10-17 DIAGNOSIS — L819 Disorder of pigmentation, unspecified: Secondary | ICD-10-CM | POA: Diagnosis not present

## 2023-10-17 DIAGNOSIS — L710 Perioral dermatitis: Secondary | ICD-10-CM | POA: Diagnosis not present

## 2023-11-11 ENCOUNTER — Ambulatory Visit (HOSPITAL_COMMUNITY)
Admission: RE | Admit: 2023-11-11 | Discharge: 2023-11-11 | Disposition: A | Discharge status: Home / Self Care | Payer: Medicare Other | Source: Ambulatory Visit | Attending: Cardiology | Admitting: Cardiology

## 2023-11-11 ENCOUNTER — Encounter (HOSPITAL_COMMUNITY): Payer: Self-pay | Admitting: Cardiology

## 2023-11-11 VITALS — BP 138/80 | HR 84 | Wt 187.0 lb

## 2023-11-11 DIAGNOSIS — D509 Iron deficiency anemia, unspecified: Secondary | ICD-10-CM | POA: Insufficient documentation

## 2023-11-11 DIAGNOSIS — E785 Hyperlipidemia, unspecified: Secondary | ICD-10-CM | POA: Diagnosis not present

## 2023-11-11 DIAGNOSIS — I2510 Atherosclerotic heart disease of native coronary artery without angina pectoris: Secondary | ICD-10-CM | POA: Diagnosis not present

## 2023-11-11 DIAGNOSIS — I110 Hypertensive heart disease with heart failure: Secondary | ICD-10-CM | POA: Insufficient documentation

## 2023-11-11 DIAGNOSIS — E119 Type 2 diabetes mellitus without complications: Secondary | ICD-10-CM | POA: Diagnosis not present

## 2023-11-11 DIAGNOSIS — Z7984 Long term (current) use of oral hypoglycemic drugs: Secondary | ICD-10-CM | POA: Insufficient documentation

## 2023-11-11 DIAGNOSIS — R9431 Abnormal electrocardiogram [ECG] [EKG]: Secondary | ICD-10-CM | POA: Diagnosis not present

## 2023-11-11 DIAGNOSIS — I5032 Chronic diastolic (congestive) heart failure: Secondary | ICD-10-CM | POA: Diagnosis not present

## 2023-11-11 DIAGNOSIS — Z79899 Other long term (current) drug therapy: Secondary | ICD-10-CM | POA: Diagnosis not present

## 2023-11-11 DIAGNOSIS — Z7982 Long term (current) use of aspirin: Secondary | ICD-10-CM | POA: Insufficient documentation

## 2023-11-11 DIAGNOSIS — Z87891 Personal history of nicotine dependence: Secondary | ICD-10-CM | POA: Diagnosis not present

## 2023-11-11 LAB — BASIC METABOLIC PANEL
Anion gap: 8 (ref 5–15)
BUN: 9 mg/dL (ref 8–23)
CO2: 23 mmol/L (ref 22–32)
Calcium: 9.1 mg/dL (ref 8.9–10.3)
Chloride: 108 mmol/L (ref 98–111)
Creatinine, Ser: 0.95 mg/dL (ref 0.44–1.00)
GFR, Estimated: >60 mL/min (ref >60)
Glucose, Bld: 139 mg/dL — ABNORMAL HIGH (ref 70–99)
Potassium: 4.2 mmol/L (ref 3.5–5.1)
Sodium: 139 mmol/L (ref 135–145)

## 2023-11-11 LAB — BRAIN NATRIURETIC PEPTIDE: B Natriuretic Peptide: 40.4 pg/mL (ref 0.0–100.0)

## 2023-11-11 NOTE — Patient Instructions (Signed)
   Labs done today, your results will be available in MyChart, we will contact you for abnormal readings.  Repeat blood work in 10 days.  Your physician recommends that you schedule a follow-up appointment in: 4 months.  If you have any questions or concerns before your next appointment please send [place] a message through [place] or call our office at [phone].    TO LEAVE A MESSAGE FOR THE NURSE SELECT OPTION 2, PLEASE LEAVE A MESSAGE INCLUDING: YOUR NAME DATE OF BIRTH CALL BACK NUMBER REASON FOR CALL**this is important as we prioritize the call backs  YOU WILL RECEIVE A CALL BACK THE SAME DAY AS LONG AS YOU CALL BEFORE 4:00 PM  At the [facility], you and your health needs are our priority. As part of our continuing mission to provide you with exceptional heart care, we have created designated Provider Care Teams. These Care Teams include your primary Cardiologist (physician) and Advanced Practice Providers (APPs- Physician Assistants and Nurse Practitioners) who all work together to provide you with the care you need, when you need it.   You may see any of the following providers on your designated Care Team at your next follow up: Dr Arvilla Meres Dr Marca Ancona Dr. Dorthula Nettles Dr. Clearnce Hasten Amy Filbert Schilder, NP Robbie Lis, [place] [facility] [place], [place] Brynda Peon, NP [place] Lee, NP Karle Plumber, PharmD   Please be sure to bring in all your medications bottles to every appointment.    Thank you for choosing [facility]

## 2023-11-12 NOTE — Progress Notes (Signed)
PCP: Berniece Salines, FNP Cardiology: Dr. Servando Salina HF Cardiology: Dr. Shirlee Latch  67 y.o. with history of HTN, hyperlipidemia, and diabetes was referred by Dr. Servando Salina for evaluation of diastolic CHF/pulmonary hypertension.  Patient has had some exertional dyspnea for a year.  I reviewed her 4/23 echo, this showed EF 70-75%, severe LVH with some asymmetry of the basal septum, LV mid cavitary gradient peak 29 at rest and 51 with Valsalva, no SAM, normal RV.  Coronary CTA in 4/23 showed a moderate proximal LCx stenosis that did not appear hemodynamically significant by FFR. Her beta blocker was titrated up with LV mid-cavity gradient.  Stress echo was done in 12/23, showing that LV mid cavity gradient increased from 12 peak to 26 peak with exercise, PASP increased from 30 to 48 with exercise.  No wall motion abnormalities with exercise.   Also of note, patient had Fe deficiency anemia.  She was seen by GI and found to be guaiac negative. No BRBPR/melena.  I gave her IV iron after initial appointment.   Patient had RHC/LHC in 4/24 showing nonobstructive CAD, mildly elevated RA pressure and normal PCWP.   Cardiac MRI in 7/24 showed EF 65%, moderate asymmetric septal hypertrophy with no mitral valve systolic anterior motion, normal RV EF 53%, no delayed enhancement.   Patient reports easy fatigability but no dyspnea with usual activities.  No chest pain.  No lightheadedness or syncope.  Weight down 3 lbs.  BP mildly elevated.     ECG (personally reviewed): NSR, LVH, LAFB  Labs (11/23): hgb 7.6, K 4.6, creatinine 0.8 Labs (3/24): BNP 18.5, LDL 69, ferritin 7, transferrin saturation 8%, TSH normal, hgb 9.3, K 4.3, creatinine 0.92 Labs (7/24): LDL 79, K 4.7, creatinine 3.08  PMH: 1. HTN 2. Type 2 diabetes 3. Hyperlipidemia 4. Epilepsy with h/o seizures. 5. Fe deficiency anemia 6. Remote tobacco use (quit 2009).  7. Chronic diastolic CHF:  ?Variant of hypertrophic cardiomyopathy.  Echo (4/23) with EF 70-75%,  severe LVH with some asymmetry of the basal septum, LV mid cavitary gradient peak 29 at rest and 51 with Valsalva, no SAM, normal RV.  - Stress echo (12/23): LV mid cavity gradient increased from 12 peak to 26 peak with exercise, PASP increased from 30 to 48 with exercise.  No wall motion abnormalities with exercise.  - RHC (4/24): mean RA 9, PA 31/11 mean 22, mean PCWP 13, CI 3.39 - Cardiac MRI (7/24): EF 65%, moderate asymmetric septal hypertrophy with no mitral valve systolic anterior motion, normal RV EF 53%, no delayed enhancement. 8. CAD: Coronary CTA in 4/23 showed 50-69% proximal LCx stenosis, FFR negative.  - Stress echo in 12/23 as above was negative for ischemia.  - LHC (4/24): Mild nonobstructive CAD.  9. OSA: Sleep study in 9/24 with OSA.   Social History   Socioeconomic History   Marital status: Widowed    Spouse name: Not on file   Number of children: 4   Years of education: Not on file   Highest education level: Not on file  Occupational History   Not on file  Tobacco Use   Smoking status: Former    Current packs/day: 0.00    Average packs/day: 0.5 packs/day for 20.0 years (10.0 ttl pk-yrs)    Types: Cigarettes    Start date: 27    Quit date: 2009    Years since quitting: 15.8   Smokeless tobacco: Never  Vaping Use   Vaping status: Never Used  Substance and Sexual Activity  Alcohol use: Yes    Comment: 07/22/2017 "glass of wine maybe once/month"   Drug use: No   Sexual activity: Not Currently  Other Topics Concern   Not on file  Social History Narrative   Widowed mother of 4.   Quit smoking in 2008.   Occasional alcohol.   Social Determinants of Health   Financial Resource Strain: Low Risk  (08/22/2023)   Overall Financial Resource Strain (CARDIA)    Difficulty of Paying Living Expenses: Not hard at all  Food Insecurity: No Food Insecurity (08/22/2023)   Hunger Vital Sign    Worried About Running Out of Food in the Last Year: Never true    Ran Out of  Food in the Last Year: Never true  Transportation Needs: No Transportation Needs (08/22/2023)   PRAPARE - Administrator, Civil Service (Medical): No    Lack of Transportation (Non-Medical): No  Physical Activity: Inactive (08/22/2023)   Exercise Vital Sign    Days of Exercise per Week: 0 days    Minutes of Exercise per Session: 0 min  Stress: Stress Concern Present (08/22/2023)   [employer] of Occupational Health - Occupational Stress Questionnaire    Feeling of Stress : To some extent  Social Connections: Moderately Integrated (08/22/2023)   Social Connection and Isolation Panel [NHANES]    Frequency of Communication with Friends and Family: More than three times a week    Frequency of Social Gatherings with Friends and Family: Once a week    Attends Religious Services: More than 4 times per year    Active Member of [employer] or Organizations: Yes    Attends Banker Meetings: 1 to 4 times per year    Marital Status: Widowed  Intimate Partner Violence: Not At Risk (08/22/2023)   Humiliation, Afraid, Rape, and Kick questionnaire    Fear of Current or Ex-Partner: No    Emotionally Abused: No    Physically Abused: No    Sexually Abused: No   FH: Mother with "hole in her heart," father with CAD.   ROS: All systems reviewed and negative except as per HPI.   Current Outpatient Medications  Medication Sig Dispense Refill   aspirin EC 81 MG tablet Take 1 tablet (81 mg total) by mouth daily. Swallow whole. 90 tablet 3   Aspirin-Caffeine (BC FAST PAIN RELIEF PO) Take 1 packet by mouth daily as needed (pain).     BELSOMRA 20 MG TABS Take 1 tablet by mouth at bedtime as needed (sleep).     carvedilol (COREG) 25 MG tablet Take 1 tablet (25 mg total) by mouth 2 (two) times daily. 180 tablet 3   Ketotifen Fumarate (ALLERGY EYE DROPS OP) Place 1 drop into both eyes daily as needed (allergies).     rosuvastatin (CRESTOR) 20 MG tablet Take 1 tablet (20 mg total) by mouth  daily. 90 tablet 3   Semaglutide (RYBELSUS) 3 MG TABS Take 1 tablet (3 mg total) by mouth daily. 30 tablet 0   SYNJARDY 12.04-999 MG TABS Take 1 tablet by mouth 2 (two) times daily.  1   diltiazem (CARDIZEM CD) 240 MG 24 hr capsule Take 1 capsule (240 mg total) by mouth daily. (Patient not taking: Reported on 11/11/2023) 30 capsule 6   No current facility-administered medications for this encounter.   BP 138/80   Pulse 84   Wt 84.8 kg (187 lb)   SpO2 96%   BMI 34.20 kg/m  General: NAD Neck: No JVD, no  thyromegaly or thyroid nodule.  Lungs: Clear to auscultation bilaterally with normal respiratory effort. CV: Nondisplaced PMI.  Heart regular S1/S2, no S3/S4, no murmur.  No peripheral edema.  No carotid bruit.  Normal pedal pulses.  Abdomen: Soft, nontender, no hepatosplenomegaly, no distention.  Skin: Intact without lesions or rashes.  Neurologic: Alert and oriented x 3.  Psych: Normal affect. Extremities: No clubbing or cyanosis.  HEENT: Normal.   Assessment/Plan: 1. Chronic diastolic CHF:  Echo in 4/23 showed EF 70-75%, severe LVH with some asymmetry of the basal septum, LV mid cavitary gradient peak 29 at rest and 51 with Valsalva, no SAM, normal RV.  Coronary CTA in 4/23 showed a moderate proximal LCx stenosis that did not appear hemodynamically significant by FFR. Her beta blocker was titrated up with LV mid-cavity gradient.  Stress echo was done in 12/23, showing that LV mid cavity gradient increased from 12 peak to 26 peak with exercise, PASP increased from 30 to 48 with exercise.  No wall motion abnormalities with exercise.  LHC/RHC in 4/24 showed no significant coronary disease and mildly elevated RA pressure.  Cardiac MRI in 7/24 showed EF 65%, moderate asymmetric septal hypertrophy with no mitral valve systolic anterior motion, normal RV EF 53%, no delayed enhancement.  She could have a lower risk variant of hypertrophic cardiomyopathy.  She does not have evidence by MRI for  cardiac amyloidosis.  She is not volume overloaded on exam.  - Continue Coreg 25 mg bid.  - Unable to take diltiazem CD (negative inotropy with mild mid-cavity gradient) due to rash.  - Continue Jardiance.  - I would like to start her on finerenone but not able to get insurance approval yet, can try in future.  - We discussed genetic testing to look for mutations associated with HCM, she is interested in this.  We will try to do this at next appointment (can hopefully use [facility] program).  2. CAD: Coronary CTA in 4/23 with moderate LCx stenosis, not hemodynamically significant by FFR.  LHC in 4/24 showed nonobstructive CAD.  - Continue Crestor - Continue ASA 81 mg daily.  3. Fe deficiency anemia: She has had IV Fe.  4. HTN: BP borderline elevated, follow.  Was not able to get finerenone for her.  If BP runs high in future, can use spironolactone.   Followup in 4 months.  Marca Ancona 11/12/2023

## 2023-11-25 ENCOUNTER — Ambulatory Visit (HOSPITAL_COMMUNITY)
Admission: RE | Admit: 2023-11-25 | Discharge: 2023-11-25 | Disposition: A | Discharge status: Home / Self Care | Payer: Medicare Other | Source: Ambulatory Visit | Attending: Cardiology | Admitting: Cardiology

## 2023-11-25 DIAGNOSIS — I5032 Chronic diastolic (congestive) heart failure: Secondary | ICD-10-CM | POA: Insufficient documentation

## 2023-11-25 LAB — BASIC METABOLIC PANEL
Anion gap: 9 (ref 5–15)
BUN: 13 mg/dL (ref 8–23)
CO2: 23 mmol/L (ref 22–32)
Calcium: 9.2 mg/dL (ref 8.9–10.3)
Chloride: 108 mmol/L (ref 98–111)
Creatinine, Ser: 0.82 mg/dL (ref 0.44–1.00)
GFR, Estimated: >60 mL/min (ref >60)
Glucose, Bld: 123 mg/dL — ABNORMAL HIGH (ref 70–99)
Potassium: 3.9 mmol/L (ref 3.5–5.1)
Sodium: 140 mmol/L (ref 135–145)

## 2023-11-27 ENCOUNTER — Encounter: Payer: Self-pay | Admitting: Nurse Practitioner

## 2023-11-27 ENCOUNTER — Ambulatory Visit: Payer: Medicare Other | Admitting: Nurse Practitioner

## 2023-11-27 VITALS — BP 132/82 | HR 81 | Temp 97.9°F | Resp 14 | Ht 62.0 in | Wt 188.5 lb

## 2023-11-27 DIAGNOSIS — I509 Heart failure, unspecified: Secondary | ICD-10-CM

## 2023-11-27 DIAGNOSIS — R5383 Other fatigue: Secondary | ICD-10-CM

## 2023-11-27 DIAGNOSIS — E669 Obesity, unspecified: Secondary | ICD-10-CM

## 2023-11-27 DIAGNOSIS — I10 Essential (primary) hypertension: Secondary | ICD-10-CM

## 2023-11-27 DIAGNOSIS — E785 Hyperlipidemia, unspecified: Secondary | ICD-10-CM

## 2023-11-27 DIAGNOSIS — E1165 Type 2 diabetes mellitus with hyperglycemia: Secondary | ICD-10-CM | POA: Diagnosis not present

## 2023-11-27 DIAGNOSIS — I2510 Atherosclerotic heart disease of native coronary artery without angina pectoris: Secondary | ICD-10-CM

## 2023-11-27 DIAGNOSIS — D508 Other iron deficiency anemias: Secondary | ICD-10-CM | POA: Diagnosis not present

## 2023-11-27 DIAGNOSIS — R569 Unspecified convulsions: Secondary | ICD-10-CM | POA: Diagnosis not present

## 2023-11-27 DIAGNOSIS — E559 Vitamin D deficiency, unspecified: Secondary | ICD-10-CM | POA: Diagnosis not present

## 2023-11-27 DIAGNOSIS — Z23 Encounter for immunization: Secondary | ICD-10-CM | POA: Diagnosis not present

## 2023-11-27 LAB — POCT GLYCOSYLATED HEMOGLOBIN (HGB A1C): Hemoglobin A1C: 7.2 % — AB (ref 4.0–5.6)

## 2023-11-27 NOTE — Progress Notes (Signed)
BP 132/82 (BP Location: Right Arm, Patient Position: Sitting, Cuff Size: Large)   Pulse 81   Temp 97.9 F (36.6 C) (Oral)   Resp 14   Ht 5\' 2"  (1.575 m) Comment: per patient  Wt 188 lb 8 oz (85.5 kg)   SpO2 96%   BMI 34.48 kg/m    Subjective:    Patient ID: Dawn Rice, female    DOB: Jan 03, 1956, 67 y.o.   MRN: [id number]  HPI: Dawn Rice is a 67 y.o. female  Chief Complaint  Patient presents with   Follow-up   Discussed the use of AI scribe software for clinical note transcription with the patient, who gave verbal consent to proceed.  History of Present Illness   The patient, with a history of hypertension, coronary artery disease, congestive heart failure, type two diabetes, obesity, and hyperlipidemia, presents with concerns about her diabetes management and fatigue. She reports that her morning blood sugar levels have been around 101-120, and her recent A1c was 7.2, down from 7.7. She expresses satisfaction with her diabetes management, stating she "doesn't play with diabetes."  The patient also reports fatigue, particularly when climbing stairs, and suspects it may be due to low iron levels. She recalls a previous episode of severe fatigue that was resolved with iron infusions. She also mentions a history of seizures, which have been well-controlled and are usually triggered by heat or stress. The last seizure was reported to have occurred in the summer.  The patient's weight has been relatively stable, with a one-pound increase since her last visit. She reports no changes in her medication regimen.       11/27/2023    1:47 PM 11/11/2023    2:24 PM 08/22/2023   10:16 AM  Vitals with BMI  Height 5\' 2"   5\' 2"   Weight 188 lbs 8 oz 187 lbs 187 lbs  BMI 34.47  34.19  Systolic 132 138 --  Diastolic 82 80 --  Pulse 81 84       CAD/CHF: established with cardiology,  Medications: asa 81 mg daily, crestor 20 mg daily, synjardy 12.5 - 1000 mg daily, coreg 25 mg  two times a day Chronic diastolic CHF:  Echo in 4/23 showed EF 70-75%, severe LVH with some asymmetry of the basal septum, LV mid cavitary gradient peak 29 at rest and 51 with Valsalva, no SAM, normal RV.  Coronary CTA in 4/23 showed a moderate proximal LCx stenosis that did not appear hemodynamically significant by FFR. Her beta blocker was titrated up with LV mid-cavity gradient.  Stress echo was done in 12/23, showing that LV mid cavity gradient increased from 12 peak to 26 peak with exercise, PASP increased from 30 to 48 with exercise.  No wall motion abnormalities with exercise.  I reviewed her 4/23 echo.  I am concerned that she could have a form of hypertrophic cardiomyopathy or cardiac amyloidosis. LHC/RHC in 4/24 showed no significant coronary disease and mildly elevated RA pressure.  She is not volume overloaded on exam.  - She is scheduled for cardiac MRI to assess for evidence of HCM or infiltrative disease.   - Pending sleep study given significant component of fatigue.  - I will use negative inotropy to try to cut down on LV mid-cavity gradient.  She is already on Coreg 25 mg bid.  With elevated BP, I will increase diltiazem CD to 240 mg daily.  - Continue Jardiance.  2. CAD: Coronary CTA in 4/23 with moderate LCx  stenosis, not hemodynamically significant by FFR.  LHC in 4/24 showed nonobstructive CAD.  - Continue Crestor - Continue ASA 81 mg daily.    Lipid Panel     Component Value Date/Time   CHOL 154 06/25/2023 1019   TRIG 112 06/25/2023 1019   HDL 55 06/25/2023 1019   CHOLHDL 2.8 06/25/2023 1019   VLDL 22 03/14/2023 1429   LDLCALC 79 06/25/2023 1019     Relevant past medical, surgical, family and social history reviewed and updated as indicated. Interim medical history since our last visit reviewed. Allergies and medications reviewed and updated.  Review of Systems  Constitutional: Negative for fever or weight change.  Respiratory: Negative for cough and shortness of  breath.   Cardiovascular: Negative for chest pain or palpitations.  Gastrointestinal: Negative for abdominal pain, no bowel changes.  Musculoskeletal: Negative for gait problem or joint swelling.  Skin: Negative for rash.  Neurological: Negative for dizziness or headache.  No other specific complaints in a complete review of systems (except as listed in HPI above).      Objective:    BP 132/82 (BP Location: Right Arm, Patient Position: Sitting, Cuff Size: Large)   Pulse 81   Temp 97.9 F (36.6 C) (Oral)   Resp 14   Ht 5\' 2"  (1.575 m) Comment: per patient  Wt 188 lb 8 oz (85.5 kg)   SpO2 96%   BMI 34.48 kg/m   Wt Readings from Last 3 Encounters:  11/27/23 188 lb 8 oz (85.5 kg)  11/11/23 187 lb (84.8 kg)  08/22/23 187 lb (84.8 kg)    Physical Exam  Constitutional: Patient appears well-developed and well-nourished. Obese  No distress.  HEENT: head atraumatic, normocephalic, pupils equal and reactive to light, neck supple, throat within normal limits Cardiovascular: Normal rate, regular rhythm and normal heart sounds.  No murmur heard. No BLE edema. Pulmonary/Chest: Effort normal and breath sounds normal. No respiratory distress. Abdominal: Soft.  There is no tenderness. Psychiatric: Patient has a normal mood and affect. behavior is normal. Judgment and thought content normal.   Diabetic Foot Exam - Simple   No data filed     Results for orders placed or performed in visit on 11/27/23  POCT HgB A1C  Result Value Ref Range   Hemoglobin A1C 7.2 (A) 4.0 - 5.6 %   HbA1c POC (<> result, manual entry)     HbA1c, POC (prediabetic range)     HbA1c, POC (controlled diabetic range)        Assessment & Plan:   Problem List Items Addressed This Visit       Cardiovascular and Mediastinum   Essential hypertension, benign (Chronic)   Coronary artery disease involving native coronary artery of native heart   Chronic congestive heart failure (HCC)     Endocrine   Diabetes (HCC)  - Primary    A1C still not at goal, has not started rybelsus yet      Relevant Orders   POCT HgB A1C (Completed)     Other   Hyperlipidemia (Chronic)   Seizures (HCC)   Obesity (BMI 30-39.9)    Comorbidities: HLD, HTN, DM, heart failure      Other Visit Diagnoses     Need for immunization against influenza       Relevant Orders   Flu vaccine trivalent PF, 6mos and older(Flulaval,Afluria,Fluarix,Fluzone) (Completed)   Other iron deficiency anemia       Relevant Orders   CBC with Differential/Platelet   Iron, TIBC and  Ferritin Panel   Other fatigue       Relevant Orders   CBC with Differential/Platelet   Iron, TIBC and Ferritin Panel   VITAMIN D 25 Hydroxy (Vit-D Deficiency, Fractures)   Vitamin B12   TSH       Assessment and Plan    Type 2 Diabetes Mellitus Improved glycemic control with recent A1c of 7.2, down from 7.7. Current regimen includes Synjardy 12.5/1000mg  twice daily. -Continue current regimen. -Check A1c in 3 months. - pick up rybelsus and start  Hypertension Blood pressure controlled at 132/82. Current regimen includes Carvedilol 25mg  twice daily. -Continue current regimen.  Hyperlipidemia On Rosuvastatin 20mg  daily. -Continue current regimen.  Obesity Weight stable with a 1-pound increase since last visit. BMI 34.48. -Encourage continued dietary modifications and physical activity.  Fatigue/iron deficiency Patient reports increased fatigue, particularly with exertion. History of iron deficiency requiring infusions. -Order labs to check iron, vitamin D, B12, and TSH levels.  Medication change Patient has not started Rybelsus due to pharmacy issues. -Resolve pharmacy issue and initiate Rybelsus for potential weight loss benefits.   Seizures not currently on medication, doing well   Heart failure/ CAD managed by cardiology no changes to medicaiton continue carvedilol 25 g BID and rosuvastatin 20 mg daily Health Maintenance Up to date  on flu vaccine and microalbuminuria screening. Due for annual diabetic eye exam. -Encourage patient to schedule diabetic eye exam. -Continue current medications including Aspirin 81mg  daily for cardiovascular risk reduction.         Follow up plan: Return in about 4 months (around 03/27/2024) for follow up.

## 2023-11-27 NOTE — Assessment & Plan Note (Signed)
A1C still not at goal, has not started rybelsus yet

## 2023-11-27 NOTE — Assessment & Plan Note (Signed)
Comorbidities: HLD, HTN, DM, heart failure

## 2023-11-28 ENCOUNTER — Other Ambulatory Visit: Payer: Self-pay | Admitting: Nurse Practitioner

## 2023-11-28 ENCOUNTER — Telehealth: Payer: Self-pay | Admitting: *Deleted

## 2023-11-28 DIAGNOSIS — D508 Other iron deficiency anemias: Secondary | ICD-10-CM

## 2023-11-28 DIAGNOSIS — E559 Vitamin D deficiency, unspecified: Secondary | ICD-10-CM

## 2023-11-28 LAB — CBC WITH DIFFERENTIAL/PLATELET
Absolute Lymphocytes: 1999 {cells}/uL (ref 850–3900)
Absolute Monocytes: 538 {cells}/uL (ref 200–950)
Basophils Absolute: 28 {cells}/uL (ref 0–200)
Basophils Relative: 0.5 %
Eosinophils Absolute: 381 {cells}/uL (ref 15–500)
Eosinophils Relative: 6.8 %
HCT: 34.4 % — ABNORMAL LOW (ref 35.0–45.0)
Hemoglobin: 10.4 g/dL — ABNORMAL LOW (ref 11.7–15.5)
MCH: 23.7 pg — ABNORMAL LOW (ref 27.0–33.0)
MCHC: 30.2 g/dL — ABNORMAL LOW (ref 32.0–36.0)
MCV: 78.4 fL — ABNORMAL LOW (ref 80.0–100.0)
MPV: 11.3 fL (ref 7.5–12.5)
Monocytes Relative: 9.6 %
Neutro Abs: 2654 {cells}/uL (ref 1500–7800)
Neutrophils Relative %: 47.4 %
Platelets: 351 10*3/uL (ref 140–400)
RBC: 4.39 10*6/uL (ref 3.80–5.10)
RDW: 16.3 % — ABNORMAL HIGH (ref 11.0–15.0)
Total Lymphocyte: 35.7 %
WBC: 5.6 10*3/uL (ref 3.8–10.8)

## 2023-11-28 LAB — IRON,TIBC AND FERRITIN PANEL
%SAT: 6 % — ABNORMAL LOW (ref 16–45)
Ferritin: 4 ng/mL — ABNORMAL LOW (ref 16–288)
Iron: 22 ug/dL — ABNORMAL LOW (ref 45–160)
TIBC: 393 ug/dL (ref 250–450)

## 2023-11-28 LAB — VITAMIN D 25 HYDROXY (VIT D DEFICIENCY, FRACTURES): Vit D, 25-Hydroxy: 8 ng/mL — ABNORMAL LOW (ref 30–100)

## 2023-11-28 LAB — VITAMIN B12: Vitamin B-12: 242 pg/mL (ref 200–1100)

## 2023-11-28 LAB — TSH: TSH: 0.44 m[IU]/L (ref 0.40–4.50)

## 2023-11-28 MED ORDER — VITAMIN D (ERGOCALCIFEROL) 1.25 MG (50000 UNIT) PO CAPS
50000.0000 [IU] | ORAL_CAPSULE | ORAL | 1 refills | Status: DC
Start: 2023-11-28 — End: 2024-07-01

## 2023-11-28 MED ORDER — IRON (FERROUS SULFATE) 325 (65 FE) MG PO TABS
325.0000 mg | ORAL_TABLET | Freq: Every day | ORAL | 1 refills | Status: AC
Start: 2023-11-28 — End: ?

## 2023-11-28 NOTE — Telephone Encounter (Signed)
Patient notified of lab results and provider recommendations:  Dawn Rice your blood count showed that you are anemic and your iron is low.  Will send in iron supplementation.  Vitamin D is also very low will send in vitamin D supplement.  Thyroid is normal your B12 is also low but improved from last time was checked.  You can either take over-the-counter B12 supplements or you can come by monthly for B12 injections.   Patient prefers monthly injection- will advised office for scheduling.

## 2023-12-03 ENCOUNTER — Ambulatory Visit: Payer: Medicare Other

## 2023-12-05 ENCOUNTER — Ambulatory Visit (INDEPENDENT_AMBULATORY_CARE_PROVIDER_SITE_OTHER): Payer: Medicare Other

## 2023-12-05 DIAGNOSIS — E538 Deficiency of other specified B group vitamins: Secondary | ICD-10-CM | POA: Diagnosis not present

## 2023-12-05 MED ORDER — CYANOCOBALAMIN 1000 MCG/ML IJ SOLN
1000.0000 ug | Freq: Once | INTRAMUSCULAR | Status: AC
Start: 2023-12-05 — End: 2023-12-05
  Administered 2023-12-05: 1000 ug via INTRAMUSCULAR

## 2023-12-05 NOTE — Progress Notes (Signed)
Patient is in office today for a nurse visit for B12 Injection. Patient Injection was given in the  Left deltoid. Patient tolerated injection well.

## 2023-12-16 ENCOUNTER — Encounter: Payer: Self-pay | Admitting: Internal Medicine

## 2023-12-16 ENCOUNTER — Other Ambulatory Visit: Payer: Self-pay

## 2023-12-16 ENCOUNTER — Ambulatory Visit (INDEPENDENT_AMBULATORY_CARE_PROVIDER_SITE_OTHER): Payer: Medicare Other | Admitting: Internal Medicine

## 2023-12-16 VITALS — BP 128/82 | HR 71 | Temp 97.9°F | Resp 16 | Ht 62.0 in | Wt 189.1 lb

## 2023-12-16 DIAGNOSIS — L989 Disorder of the skin and subcutaneous tissue, unspecified: Secondary | ICD-10-CM

## 2023-12-16 DIAGNOSIS — L299 Pruritus, unspecified: Secondary | ICD-10-CM | POA: Diagnosis not present

## 2023-12-16 MED ORDER — HYDROCORTISONE 1 % EX OINT: 1.0000 | TOPICAL_OINTMENT | Freq: Two times a day (BID) | CUTANEOUS | 0 refills | Status: DC

## 2023-12-16 NOTE — Progress Notes (Signed)
   Acute Office Visit  Subjective:     Patient ID: Dawn Rice, female    DOB: 29-Apr-1956, 67 y.o.   MRN: [id number]  Chief Complaint  Patient presents with   Leg Pain    Left leg pain, sore that will not heal   Hip Pain    Right hip pain    HPI Patient is in today for sore on left leg that will not heal. She's had it for about 1 month, not sure what caused it. It started out as a hard bump on the left hip that was very itchy. Did heal but scabbed over due to excessive itching. No drainage from the wound, skin around it slightly swollen but no pain or redness. No fevers or other skin changes.    Review of Systems  Constitutional:  Negative for chills and fever.  Skin:  Positive for itching and rash.        Objective:    BP 128/82   Pulse 71   Temp 97.9 F (36.6 C) (Oral)   Resp 16   Ht 5\' 2"  (1.575 m)   Wt 189 lb 1.6 oz (85.8 kg)   SpO2 98%   BMI 34.59 kg/m  BP Readings from Last 3 Encounters:  12/16/23 128/82  11/27/23 132/82  11/11/23 138/80   Wt Readings from Last 3 Encounters:  12/16/23 189 lb 1.6 oz (85.8 kg)  11/27/23 188 lb 8 oz (85.5 kg)  11/11/23 187 lb (84.8 kg)      Physical Exam Constitutional:      Appearance: Normal appearance.  HENT:     Head: Normocephalic and atraumatic.  Eyes:     Conjunctiva/sclera: Conjunctivae normal.  Cardiovascular:     Rate and Rhythm: Normal rate and regular rhythm.  Pulmonary:     Effort: Pulmonary effort is normal.     Breath sounds: Normal breath sounds.  Skin:    General: Skin is warm and dry.     Comments: What appears to be an inflamed SK on her left hip. No sings of infection.  Neurological:     General: No focal deficit present.     Mental Status: She is alert. Mental status is at baseline.  Psychiatric:        Mood and Affect: Mood normal.        Behavior: Behavior normal.     No results found for any visits on 12/16/23.      Assessment & Plan:   1. Skin abnormality  (Primary)/Itching: Skin lesion either a healing scab or inflamed SK, will prescribe a steroid cream to decrease the itching and if it does not resolve we can try to remove it with liquid nitrogen.   - hydrocortisone 1 % ointment; Apply 1 Application topically 2 (two) times daily.  Dispense: 30 g; Refill: 0   Return for already scheduled.  Margarita Mail, DO

## 2023-12-19 DIAGNOSIS — G4730 Sleep apnea, unspecified: Secondary | ICD-10-CM | POA: Diagnosis not present

## 2023-12-19 DIAGNOSIS — R0902 Hypoxemia: Secondary | ICD-10-CM | POA: Diagnosis not present

## 2023-12-23 ENCOUNTER — Telehealth: Payer: Self-pay | Admitting: Nurse Practitioner

## 2023-12-23 NOTE — Telephone Encounter (Signed)
Copied from CRM [id number]. Topic: General - Other >> Dec 23, 2023  8:15 AM Dawn Rice wrote: Pt [name] is calling because she needs a document completed by December 31st. This document reduces the cost of her medication  (SYNJARDY 12.04-999 MG TABS)  or else she will not be able to afford the medication. Can she bring the document up today? Please advise.

## 2023-12-23 NOTE — Telephone Encounter (Signed)
Spoke with pt and she will drop off the paperwork today after 11. I also told pt that it was no guarantee that the paper work would be completed by the 31st because we have 7 business days to complete all paperwork. She verbalized understanding and stated she just forget to bring it.

## 2023-12-24 ENCOUNTER — Other Ambulatory Visit: Payer: Self-pay | Admitting: Nurse Practitioner

## 2023-12-24 DIAGNOSIS — E1165 Type 2 diabetes mellitus with hyperglycemia: Secondary | ICD-10-CM

## 2023-12-24 MED ORDER — SYNJARDY 12.5-1000 MG PO TABS: 1.0000 | ORAL_TABLET | Freq: Two times a day (BID) | ORAL | 3 refills | Status: DC

## 2024-01-02 ENCOUNTER — Ambulatory Visit: Payer: Medicare Other

## 2024-01-03 ENCOUNTER — Ambulatory Visit: Payer: Medicare Other

## 2024-01-07 ENCOUNTER — Ambulatory Visit: Payer: Medicare Other

## 2024-01-07 ENCOUNTER — Ambulatory Visit (INDEPENDENT_AMBULATORY_CARE_PROVIDER_SITE_OTHER): Payer: Medicare Other

## 2024-01-07 DIAGNOSIS — E538 Deficiency of other specified B group vitamins: Secondary | ICD-10-CM | POA: Diagnosis not present

## 2024-01-07 MED ORDER — CYANOCOBALAMIN 1000 MCG/ML IJ SOLN
1000.0000 ug | Freq: Once | INTRAMUSCULAR | Status: AC
Start: 2024-01-07 — End: 2024-01-07
  Administered 2024-01-07: 1000 ug via INTRAMUSCULAR

## 2024-01-15 ENCOUNTER — Ambulatory Visit: Payer: Medicare Other | Admitting: Nurse Practitioner

## 2024-01-15 NOTE — Progress Notes (Deleted)
There were no vitals taken for this visit.   Subjective:    Patient ID: Dawn Rice, female    DOB: 24-Dec-1956, 68 y.o.   MRN: [id number]  HPI: Dawn Rice is a 68 y.o. female  No chief complaint on file.   Discussed the use of AI scribe software for clinical note transcription with the patient, who gave verbal consent to proceed.  History of Present Illness           11/27/2023    1:49 PM 08/22/2023   10:23 AM 07/25/2023    2:02 PM  Depression screen PHQ 2/9  Decreased Interest 0 0 0  Down, Depressed, Hopeless 0 0 0  PHQ - 2 Score 0 0 0  Altered sleeping 0    Tired, decreased energy 0    Change in appetite 0    Feeling bad or failure about yourself  0    Trouble concentrating 0    Moving slowly or fidgety/restless 0    Suicidal thoughts 0    PHQ-9 Score 0      Relevant past medical, surgical, family and social history reviewed and updated as indicated. Interim medical history since our last visit reviewed. Allergies and medications reviewed and updated.  Review of Systems  Per HPI unless specifically indicated above     Objective:    There were no vitals taken for this visit.  {Vitals History (Optional):23777} Wt Readings from Last 3 Encounters:  12/16/23 189 lb 1.6 oz (85.8 kg)  11/27/23 188 lb 8 oz (85.5 kg)  11/11/23 187 lb (84.8 kg)    Physical Exam  Results for orders placed or performed in visit on 11/27/23  POCT HgB A1C   Collection Time: 11/27/23  1:50 PM  Result Value Ref Range   Hemoglobin A1C 7.2 (A) 4.0 - 5.6 %   HbA1c POC (<> result, manual entry)     HbA1c, POC (prediabetic range)     HbA1c, POC (controlled diabetic range)    CBC with Differential/Platelet   Collection Time: 11/27/23  2:16 PM  Result Value Ref Range   WBC 5.6 3.8 - 10.8 Thousand/uL   RBC 4.39 3.80 - 5.10 Million/uL   Hemoglobin 10.4 (L) 11.7 - 15.5 g/dL   HCT 78.2 (L) 95.6 - 21.3 %   MCV 78.4 (L) 80.0 - 100.0 fL   MCH 23.7 (L) 27.0 - 33.0 pg   MCHC 30.2  (L) 32.0 - 36.0 g/dL   RDW 08.6 (H) 57.8 - 46.9 %   Platelets 351 140 - 400 Thousand/uL   MPV 11.3 7.5 - 12.5 fL   Neutro Abs 2,654 1,500 - 7,800 cells/uL   Absolute Lymphocytes 1,999 850 - 3,900 cells/uL   Absolute Monocytes 538 200 - 950 cells/uL   Eosinophils Absolute 381 15 - 500 cells/uL   Basophils Absolute 28 0 - 200 cells/uL   Neutrophils Relative % 47.4 %   Total Lymphocyte 35.7 %   Monocytes Relative 9.6 %   Eosinophils Relative 6.8 %   Basophils Relative 0.5 %  Iron, TIBC and Ferritin Panel   Collection Time: 11/27/23  2:16 PM  Result Value Ref Range   Iron 22 (L) 45 - 160 mcg/dL   TIBC 629 528 - 413 mcg/dL (calc)   %SAT 6 (L) 16 - 45 % (calc)   Ferritin 4 (L) 16 - 288 ng/mL  VITAMIN D 25 Hydroxy (Vit-D Deficiency, Fractures)   Collection Time: 11/27/23  2:16 PM  Result Value Ref  Range   Vit D, 25-Hydroxy 8 (L) 30 - 100 ng/mL  TSH   Collection Time: 11/27/23  2:16 PM  Result Value Ref Range   TSH 0.44 0.40 - 4.50 mIU/L  Vitamin B12   Collection Time: 11/27/23  2:16 PM  Result Value Ref Range   Vitamin B-12 242 200 - 1,100 pg/mL   {Labs (Optional):23779}    Assessment & Plan:   Problem List Items Addressed This Visit   None    Assessment and Plan             Follow up plan: No follow-ups on file.

## 2024-01-18 DIAGNOSIS — E1165 Type 2 diabetes mellitus with hyperglycemia: Secondary | ICD-10-CM | POA: Diagnosis not present

## 2024-01-20 ENCOUNTER — Encounter: Payer: Self-pay | Admitting: Cardiology

## 2024-01-27 ENCOUNTER — Other Ambulatory Visit: Payer: Self-pay | Admitting: Cardiology

## 2024-01-29 ENCOUNTER — Other Ambulatory Visit: Payer: Self-pay | Admitting: Cardiology

## 2024-01-31 ENCOUNTER — Telehealth (HOSPITAL_COMMUNITY): Payer: Self-pay | Admitting: Cardiology

## 2024-01-31 NOTE — Telephone Encounter (Signed)
 Pt left VM on triage line with concerns of pain in the middle of chest  Returned call to number listed on VM [phone] -woman answered stated this is the wrong number for [employer]

## 2024-02-06 ENCOUNTER — Emergency Department (HOSPITAL_COMMUNITY): Payer: Medicare Other

## 2024-02-06 ENCOUNTER — Emergency Department (HOSPITAL_COMMUNITY)
Admission: EM | Admit: 2024-02-06 | Discharge: 2024-02-06 | Disposition: A | Discharge status: Home / Self Care | Payer: Medicare Other | Attending: Emergency Medicine | Admitting: Emergency Medicine

## 2024-02-06 ENCOUNTER — Encounter (HOSPITAL_COMMUNITY): Payer: Self-pay | Admitting: *Deleted

## 2024-02-06 ENCOUNTER — Other Ambulatory Visit: Payer: Self-pay

## 2024-02-06 DIAGNOSIS — R200 Anesthesia of skin: Secondary | ICD-10-CM | POA: Diagnosis not present

## 2024-02-06 DIAGNOSIS — Z79899 Other long term (current) drug therapy: Secondary | ICD-10-CM | POA: Diagnosis not present

## 2024-02-06 DIAGNOSIS — I10 Essential (primary) hypertension: Secondary | ICD-10-CM | POA: Diagnosis not present

## 2024-02-06 DIAGNOSIS — R079 Chest pain, unspecified: Secondary | ICD-10-CM | POA: Diagnosis not present

## 2024-02-06 DIAGNOSIS — Z7982 Long term (current) use of aspirin: Secondary | ICD-10-CM | POA: Diagnosis not present

## 2024-02-06 DIAGNOSIS — R202 Paresthesia of skin: Secondary | ICD-10-CM | POA: Insufficient documentation

## 2024-02-06 DIAGNOSIS — R0789 Other chest pain: Secondary | ICD-10-CM | POA: Diagnosis not present

## 2024-02-06 DIAGNOSIS — Z794 Long term (current) use of insulin: Secondary | ICD-10-CM | POA: Diagnosis not present

## 2024-02-06 DIAGNOSIS — I700 Atherosclerosis of aorta: Secondary | ICD-10-CM | POA: Diagnosis not present

## 2024-02-06 DIAGNOSIS — E119 Type 2 diabetes mellitus without complications: Secondary | ICD-10-CM | POA: Diagnosis not present

## 2024-02-06 LAB — BASIC METABOLIC PANEL
Anion gap: 10 (ref 5–15)
BUN: 10 mg/dL (ref 8–23)
CO2: 23 mmol/L (ref 22–32)
Calcium: 9.1 mg/dL (ref 8.9–10.3)
Chloride: 106 mmol/L (ref 98–111)
Creatinine, Ser: 0.88 mg/dL (ref 0.44–1.00)
GFR, Estimated: >60 mL/min (ref >60)
Glucose, Bld: 138 mg/dL — ABNORMAL HIGH (ref 70–99)
Potassium: 3.9 mmol/L (ref 3.5–5.1)
Sodium: 139 mmol/L (ref 135–145)

## 2024-02-06 LAB — TROPONIN I (HIGH SENSITIVITY)
Troponin I (High Sensitivity): 5 ng/L (ref <18)
Troponin I (High Sensitivity): 5 ng/L (ref <18)

## 2024-02-06 LAB — CBC
HCT: 37.6 % (ref 36.0–46.0)
Hemoglobin: 11.3 g/dL — ABNORMAL LOW (ref 12.0–15.0)
MCH: 23.5 pg — ABNORMAL LOW (ref 26.0–34.0)
MCHC: 30.1 g/dL (ref 30.0–36.0)
MCV: 78.2 fL — ABNORMAL LOW (ref 80.0–100.0)
Platelets: 345 10*3/uL (ref 150–400)
RBC: 4.81 MIL/uL (ref 3.87–5.11)
RDW: 20 % — ABNORMAL HIGH (ref 11.5–15.5)
WBC: 5.7 10*3/uL (ref 4.0–10.5)
nRBC: 0 % (ref 0.0–0.2)

## 2024-02-06 NOTE — ED Triage Notes (Signed)
The pt started having chest pain while sleeping and her lt arm began to get numb around 0000 midnight

## 2024-02-06 NOTE — ED Provider Notes (Signed)
[place] EMERGENCY DEPARTMENT AT [facility] Provider Note   CSN: [id number] Arrival date & time: 02/06/24  0151     History  Chief Complaint  Patient presents with   Chest Pain    Dawn Rice is a 68 y.o. female.   Chest Pain Patient presents with chest pain.  Has now been in the ER from 13 hours and just got back to a room.  States woke last night with tightness in her left chest.  States her left arm also felt numb.  Had pressure in the chest but feeling better now.  States yesterday had been doing well.  No exertional pain.  History of pulmonary hypertension.    Past Medical History:  Diagnosis Date   Cataract    Epilepsy (HCC)    Gout    History of blood transfusion    "when I got my hysterectomy" (07/22/2017)   Hyperlipidemia    Hypertension    Seizures (HCC)    "from epilepsy; the kind where I space out; last one was ~ 1 yr ago" (07/22/2017)   Type II diabetes mellitus (HCC)     Home Medications Prior to Admission medications   Medication Sig Start Date End Date Taking? Authorizing Provider  aspirin EC 81 MG tablet Take 1 tablet (81 mg total) by mouth daily. Swallow whole. 04/04/22   Tobb, Kardie, DO  Aspirin-Caffeine (BC FAST PAIN RELIEF PO) Take 1 packet by mouth daily as needed (pain).    [provider]  BELSOMRA 20 MG TABS Take 1 tablet by mouth at bedtime as needed (sleep). 01/31/22   [provider]  carvedilol (COREG) 25 MG tablet TAKE 1 TABLET(25 MG) BY MOUTH TWICE DAILY 01/29/24   Tobb, Kardie, DO  hydrocortisone 1 % ointment Apply 1 Application topically 2 (two) times daily. 12/16/23   Margarita Mail, DO  Iron, Ferrous Sulfate, 325 (65 Fe) MG TABS Take 325 mg by mouth daily. 11/28/23   Berniece Salines, FNP  Ketotifen Fumarate (ALLERGY EYE DROPS OP) Place 1 drop into both eyes daily as needed (allergies).    [provider]  rosuvastatin (CRESTOR) 20 MG tablet Take 1 tablet (20 mg total) by mouth daily. 04/04/22  03/13/24  Tobb, Kardie, DO  Semaglutide (RYBELSUS) 3 MG TABS Take 1 tablet (3 mg total) by mouth daily. 07/02/23   Berniece Salines, FNP  SYNJARDY 12.04-999 MG TABS Take 1 tablet by mouth 2 (two) times daily. 12/24/23   Berniece Salines, FNP  Vitamin D, Ergocalciferol, (DRISDOL) 1.25 MG (50000 UNIT) CAPS capsule Take 1 capsule (50,000 Units total) by mouth every 7 (seven) days. 11/28/23   Berniece Salines, FNP      Allergies    Contrave [naltrexone-bupropion hcl er], Metformin and related, and Penicillins    Review of Systems   Review of Systems  Cardiovascular:  Positive for chest pain.    Physical Exam Updated Vital Signs BP (!) 178/93 (BP Location: Left Arm)   Pulse 78   Temp 98.3 F (36.8 C) (Oral)   Resp 18   Ht 5\' 2"  (1.575 m)   Wt 85.8 kg   SpO2 100%   BMI 34.60 kg/m  Physical Exam Vitals and nursing note reviewed.  Cardiovascular:     Rate and Rhythm: Normal rate and regular rhythm.  Pulmonary:     Breath sounds: No wheezing, rhonchi or rales.  Neurological:     Mental Status: She is alert.     Comments: Sensation  and strength intact in bilateral upper extremities.     ED Results / Procedures / Treatments   Labs (all labs ordered are listed, but only abnormal results are displayed) Labs Reviewed  BASIC METABOLIC PANEL - Abnormal; Notable for the following components:      Result Value   Glucose, Bld 138 (*)    All other components within normal limits  CBC - Abnormal; Notable for the following components:   Hemoglobin 11.3 (*)    MCV 78.2 (*)    MCH 23.5 (*)    RDW 20.0 (*)    All other components within normal limits  TROPONIN I (HIGH SENSITIVITY)  TROPONIN I (HIGH SENSITIVITY)    EKG EKG Interpretation Date/Time:  Thursday February 06 2024 02:28:43 EST Ventricular Rate:  80 PR Interval:  160 QRS Duration:  98 QT Interval:  392 QTC Calculation: 452 R Axis:   -13  Text Interpretation: Normal sinus rhythm Anterior infarct , age undetermined Abnormal  ECG When compared with ECG of 11-Nov-2023 14:24, No significant change since last tracing Confirmed by Benjiman Core [phone]) on 02/06/2024 2:49:56 PM  Radiology DG Chest 2 View Result Date: 02/06/2024 CLINICAL DATA:  Mid chest pain with left arm numbness EXAM: CHEST - 2 VIEW COMPARISON:  Radiograph 08/03/2021 FINDINGS: Stable cardiomediastinal silhouette. Aortic atherosclerotic calcification. No focal consolidation, pleural effusion, or pneumothorax. No displaced rib fractures. IMPRESSION: No active cardiopulmonary disease. Electronically Signed   By: Minerva Fester M.D.   On: 02/06/2024 03:06    Procedures Procedures    Medications Ordered in ED Medications - No data to display  ED Course/ Medical Decision Making/ A&P                                 Medical Decision Making  Patient presented with chest pain.  Had some shortness of breath and pressure in left arm.  Has resolved.  Differential diagnosis is long but includes causes such as coronary disease, pneumonia  EKG reassuring.  Chest x-ray reassuring.  He does have some hypertension but is even improved and has not had all her medicines now.  Troponin negative.  Doubt pulmonary embolism.  Appears stable for discharge home.  I reviewed previous cardiology note.         Final Clinical Impression(s) / ED Diagnoses Final diagnoses:  Nonspecific chest pain    Rx / DC Orders ED Discharge Orders          Ordered    Ambulatory referral to Cardiology       Comments: If you have not heard from the Cardiology office within the next 72 hours please call [phone].   02/06/24 1456              Benjiman Core, MD 02/06/24 1500

## 2024-02-06 NOTE — ED Notes (Signed)
This RN reviewed discharge instructions with patient. She verbalized understanding and denied any further questions. PT well appearing upon discharge and reports no pain. Pt ambulated with stable gait to exit. Pt endorses ride home.

## 2024-02-06 NOTE — ED Provider Triage Note (Signed)
Emergency Medicine Provider Triage Evaluation Note  Dawn Rice , a 68 y.o. female  was evaluated in triage.  Pt complains of chest painx1 week with now L arm intermittent numbness. Chest pain is pressurized.  Review of Systems  Positive: Chest pain Negative: SOB  Physical Exam  BP (!) 183/106 (BP Location: Right Arm)   Pulse 84   Temp 97.9 F (36.6 C)   Resp 19   Ht 5\' 2"  (1.575 m)   Wt 85.8 kg   SpO2 100%   BMI 34.60 kg/m  Gen:   Awake, no distress   Resp:  Normal effort  MSK:   Moves extremities without difficulty  Other:  No neurodeficits on exam  Medical Decision Making  Medically screening exam initiated at 2:44 AM.  Appropriate orders placed.  Dawn Rice was informed that the remainder of the evaluation will be completed by another provider, this initial triage assessment does not replace that evaluation, and the importance of remaining in the ED until their evaluation is complete.    Pete Pelt, [place] 02/06/24 [phone]

## 2024-02-24 ENCOUNTER — Encounter: Payer: Self-pay | Admitting: Podiatry

## 2024-02-24 ENCOUNTER — Ambulatory Visit (INDEPENDENT_AMBULATORY_CARE_PROVIDER_SITE_OTHER): Payer: Medicare Other | Admitting: Podiatry

## 2024-02-24 DIAGNOSIS — E119 Type 2 diabetes mellitus without complications: Secondary | ICD-10-CM | POA: Diagnosis not present

## 2024-02-24 DIAGNOSIS — M2042 Other hammer toe(s) (acquired), left foot: Secondary | ICD-10-CM

## 2024-02-24 DIAGNOSIS — B351 Tinea unguium: Secondary | ICD-10-CM | POA: Diagnosis not present

## 2024-02-24 DIAGNOSIS — M79674 Pain in right toe(s): Secondary | ICD-10-CM

## 2024-02-24 DIAGNOSIS — M79675 Pain in left toe(s): Secondary | ICD-10-CM | POA: Diagnosis not present

## 2024-02-24 DIAGNOSIS — M2041 Other hammer toe(s) (acquired), right foot: Secondary | ICD-10-CM | POA: Diagnosis not present

## 2024-02-24 NOTE — Progress Notes (Signed)
 This patient returns to my office for at risk foot care.  This patient requires this care by a professional since this patient will be at risk due to having diabetes.  This patient is unable to cut nails herself since the patient cannot reach her nails.These nails are painful walking and wearing shoes. She also has pain in the fifth toe right foot.  This patient presents for at risk foot care today.  General Appearance  Alert, conversant and in no acute stress.  Vascular  Dorsalis pedis and posterior tibial  pulses are palpable  bilaterally.  Capillary return is within normal limits  bilaterally. Temperature is within normal limits  bilaterally.  Neurologic  Senn-Weinstein monofilament wire test within normal limits  bilaterally. Muscle power within normal limits bilaterally.  Nails Thick disfigured discolored nails with subungual debris  from hallux to fifth toes bilaterally. No evidence of bacterial infection or drainage bilaterally.  Orthopedic  No limitations of motion  feet .  No crepitus or effusions noted.  No bony pathology or digital deformities noted.  HAV  B/L.  Adducto-varus fifth toe right foot.  Skin  normotropic skin with no porokeratosis noted bilaterally.  No signs of infections or ulcers noted.     Onychomycosis  Pain in right toes  Pain in left toes  Consent was obtained for treatment procedures.   Mechanical debridement of nails 1-5  bilaterally performed with a nail nipper.  Filed with dremel without incident.    Return office visit    prn                 Told patient to return for periodic foot care and evaluation due to potential at risk complications.   Helane Gunther DPM

## 2024-02-28 ENCOUNTER — Telehealth: Payer: Self-pay

## 2024-02-28 ENCOUNTER — Ambulatory Visit: Payer: Self-pay | Admitting: Nurse Practitioner

## 2024-02-28 DIAGNOSIS — E1165 Type 2 diabetes mellitus with hyperglycemia: Secondary | ICD-10-CM

## 2024-02-28 NOTE — Telephone Encounter (Signed)
 Copied from CRM [id number]. Topic: Clinical - Red Word Triage >> Feb 28, 2024  1:54 PM Abundio Miu S wrote: [facility] that prompted transfer to Nurse Triage: Lightheaded and dizziness   Chief Complaint: Dizziness Symptoms: fatigue, room spinning Frequency: intermittent Pertinent Negatives: Patient denies chest pain Disposition: [] ED /[] Urgent Care (no appt availability in office) / [x] Appointment(In office/virtual)/ []  [place] [facility]/ [] Home Care/ [] Refused Recommended Disposition /[] [place] [facility]/ []  Follow-up with PCP Additional Notes: Pt reports symptoms started 2 weeks ago, has been feeling more tired than usual. Sts when bending over the room spins, but gets better when she sits down. Appt schedule for 3/10 with PCP. Care advice given.    Reason for Disposition  [1] MILD dizziness (e.g., walking normally) AND [2] has NOT been evaluated by doctor (or NP/PA) for this  (Exception: Dizziness caused by heat exposure, sudden standing, or poor fluid intake.)  Answer Assessment - Initial Assessment Questions 1. DESCRIPTION: "Describe your dizziness."     "I havent been feeling liike myself"; fatigued  2. LIGHTHEADED: "Do you feel lightheaded?" (e.g., somewhat faint, woozy, weak upon standing)     Somewhat faint  3. VERTIGO: "Do you feel like either you or the room is spinning or tilting?" (i.e. vertigo)     Room is spinning when bending over  4. SEVERITY: "How bad is it?"  "Do you feel like you are going to faint?" "Can you stand and walk?"   - MILD: Feels slightly dizzy, but walking normally.   - MODERATE: Feels unsteady when walking, but not falling; interferes with normal activities (e.g., school, work).   - SEVERE: Unable to walk without falling, or requires assistance to walk without falling; feels like passing out now.      Last a few seconds, gets better when sitting down  5. ONSET:  "When did the dizziness begin?"     2 weeks now, off/on  6. AGGRAVATING FACTORS: "Does  anything make it worse?" (e.g., standing, change in head position)     Bending over  7. HEART RATE: "Can you tell me your heart rate?" "How many beats in 15 seconds?"  (Note: not all patients can do this)       No  8. CAUSE: "What do you think is causing the dizziness?"     Unsure of cause  9. RECURRENT SYMPTOM: "Have you had dizziness before?" If Yes, ask: "When was the last time?" "What happened that time?"     No  10. OTHER SYMPTOMS: "Do you have any other symptoms?" (e.g., fever, chest pain, vomiting, diarrhea, bleeding)       No  11. PREGNANCY: "Is there any chance you are pregnant?" "When was your last menstrual period?"       no  Protocols used: Dizziness - Lightheadedness-A-AH

## 2024-02-28 NOTE — Addendum Note (Signed)
 Addended by: Davene Costain on: 02/28/2024 03:30 PM   Modules accepted: Orders

## 2024-02-28 NOTE — Telephone Encounter (Signed)
 Copied from CRM [id number]. Topic: General - Other >> Feb 28, 2024 10:01 AM Epimenio Foot F wrote: Reason for CRM: Patient is calling in because she says she dropped some paperwork off back in December that needed to be filled out and faxed, and she says the company that is supposed to receive it, never did. Patient is requesting a call back.

## 2024-02-28 NOTE — Telephone Encounter (Signed)
 Refaxed paperwork, STAT referral put in for pharmacy

## 2024-02-28 NOTE — Telephone Encounter (Signed)
 Copied from CRM [id number]. Topic: Clinical - Prescription Issue >> Feb 28, 2024  9:51 AM [name] wrote: Reason for CRM: Patient was checking on the status of her financial assistance form that she dropped - After some research, I was able to see that the clinic completed the form and I provided the number on the enrollment form for the patient. She has not heard from the company as of yet, however, she will be reaching out to them today.   she would like to know if the office has any samples of the [facility] as she only has 5 pills left at this time. Please call the patient when you have a moment.

## 2024-03-02 ENCOUNTER — Ambulatory Visit (INDEPENDENT_AMBULATORY_CARE_PROVIDER_SITE_OTHER): Admitting: Nurse Practitioner

## 2024-03-02 ENCOUNTER — Encounter: Payer: Self-pay | Admitting: Nurse Practitioner

## 2024-03-02 VITALS — BP 136/82 | HR 75 | Resp 18 | Ht 62.0 in | Wt 188.5 lb

## 2024-03-02 DIAGNOSIS — R42 Dizziness and giddiness: Secondary | ICD-10-CM | POA: Diagnosis not present

## 2024-03-02 DIAGNOSIS — E1165 Type 2 diabetes mellitus with hyperglycemia: Secondary | ICD-10-CM | POA: Diagnosis not present

## 2024-03-02 DIAGNOSIS — D649 Anemia, unspecified: Secondary | ICD-10-CM | POA: Diagnosis not present

## 2024-03-02 DIAGNOSIS — R5383 Other fatigue: Secondary | ICD-10-CM

## 2024-03-02 DIAGNOSIS — D508 Other iron deficiency anemias: Secondary | ICD-10-CM | POA: Diagnosis not present

## 2024-03-02 DIAGNOSIS — E559 Vitamin D deficiency, unspecified: Secondary | ICD-10-CM | POA: Diagnosis not present

## 2024-03-02 NOTE — Progress Notes (Signed)
 Outreach to [facility] on behalf of patient to determine the status of patient's application for 2025 re-enrollment in program. Per [employer] representative Thayer Ohm, application for re-enrollment has not been received for 2025.

## 2024-03-02 NOTE — Progress Notes (Signed)
 BP 136/82   Pulse 75   Resp 18   Ht 5\' 2"  (1.575 m)   Wt 188 lb 8 oz (85.5 kg)   SpO2 97%   BMI 34.48 kg/m    Subjective:    Patient ID: Dawn Rice, female    DOB: Mar 17, 1956, 68 y.o.   MRN: [id number]  HPI: Dawn Rice is a 68 y.o. female  Chief Complaint  Patient presents with   Dizziness    X2 weeks    Discussed the use of AI scribe software for clinical note transcription with the patient, who gave verbal consent to proceed.  History of Present Illness   The patient presents with dizziness, lightheadedness and fatigue  She has been experiencing dizziness and lightheadedness for the past two weeks. The dizziness is not position-dependent, but she feels lightheaded when leaning over or performing activities. She denies any sensation of impending syncope but mentions needing to sit down to regain composure. No recent trauma, fever, or illness is reported.  She visited the emergency room due to chest discomfort but was informed there was no significant issue. She is concerned about her blood sugar levels, noting fluctuations with readings in the 90s, 80s, and a recent reading of 144. She mentions not having her medication currently, which may be affecting her blood sugar control.  She reports feeling fatigued and occasionally nauseated when lightheaded. She also notes tingling in her hands and soreness in her arm, for which she uses a brace. She mentions the appearance of sores and scratches on her skin. No headaches, numbness, or tingling in her arms or legs are reported.  She lives alone and has been feeling more relaxed recently, as her children have not been around as much, allowing her some peace.        11/27/2023    1:49 PM 08/22/2023   10:23 AM 07/25/2023    2:02 PM  Depression screen PHQ 2/9  Decreased Interest 0 0 0  Down, Depressed, Hopeless 0 0 0  PHQ - 2 Score 0 0 0  Altered sleeping 0    Tired, decreased energy 0    Change in appetite 0     Feeling bad or failure about yourself  0    Trouble concentrating 0    Moving slowly or fidgety/restless 0    Suicidal thoughts 0    PHQ-9 Score 0      Relevant past medical, surgical, family and social history reviewed and updated as indicated. Interim medical history since our last visit reviewed. Allergies and medications reviewed and updated.  Review of Systems  Ten systems reviewed and is negative except as mentioned in HPI      Objective:    BP 136/82   Pulse 75   Resp 18   Ht 5\' 2"  (1.575 m)   Wt 188 lb 8 oz (85.5 kg)   SpO2 97%   BMI 34.48 kg/m   BP Readings from Last 3 Encounters:  03/02/24 136/82  02/06/24 (!) 178/93  12/16/23 128/82     Wt Readings from Last 3 Encounters:  03/02/24 188 lb 8 oz (85.5 kg)  02/06/24 189 lb 2.5 oz (85.8 kg)  12/16/23 189 lb 1.6 oz (85.8 kg)    Physical Exam Vitals reviewed.  Constitutional:      Appearance: Normal appearance.  HENT:     Head: Normocephalic.  Cardiovascular:     Rate and Rhythm: Normal rate and regular rhythm.  Pulmonary:  Effort: Pulmonary effort is normal.     Breath sounds: Normal breath sounds.  Musculoskeletal:        General: Normal range of motion.  Skin:    General: Skin is warm and dry.  Neurological:     General: No focal deficit present.     Mental Status: She is alert and oriented to person, place, and time. Mental status is at baseline.     GCS: GCS eye subscore is 4. GCS verbal subscore is 5. GCS motor subscore is 6.     Cranial Nerves: Cranial nerves 2-12 are intact.     Sensory: Sensation is intact.     Motor: Motor function is intact.     Coordination: Coordination is intact. Romberg sign negative.     Gait: Gait is intact.  Psychiatric:        Mood and Affect: Mood normal.        Behavior: Behavior normal.        Thought Content: Thought content normal.        Judgment: Judgment normal.     Results for orders placed or performed during the hospital encounter of 02/06/24   Basic metabolic panel   Collection Time: 02/06/24  2:23 AM  Result Value Ref Range   Sodium 139 135 - 145 mmol/L   Potassium 3.9 3.5 - 5.1 mmol/L   Chloride 106 98 - 111 mmol/L   CO2 23 22 - 32 mmol/L   Glucose, Bld 138 (H) 70 - 99 mg/dL   BUN 10 8 - 23 mg/dL   Creatinine, Ser 1.61 0.44 - 1.00 mg/dL   Calcium 9.1 8.9 - 09.6 mg/dL   GFR, Estimated >04 >54 mL/min   Anion gap 10 5 - 15  CBC   Collection Time: 02/06/24  2:23 AM  Result Value Ref Range   WBC 5.7 4.0 - 10.5 K/uL   RBC 4.81 3.87 - 5.11 MIL/uL   Hemoglobin 11.3 (L) 12.0 - 15.0 g/dL   HCT 09.8 11.9 - 14.7 %   MCV 78.2 (L) 80.0 - 100.0 fL   MCH 23.5 (L) 26.0 - 34.0 pg   MCHC 30.1 30.0 - 36.0 g/dL   RDW 82.9 (H) 56.2 - 13.0 %   Platelets 345 150 - 400 K/uL   nRBC 0.0 0.0 - 0.2 %  Troponin I (High Sensitivity)   Collection Time: 02/06/24  2:23 AM  Result Value Ref Range   Troponin I (High Sensitivity) 5 <18 ng/L  Troponin I (High Sensitivity)   Collection Time: 02/06/24  5:52 AM  Result Value Ref Range   Troponin I (High Sensitivity) 5 <18 ng/L       Assessment & Plan:   Problem List Items Addressed This Visit   None Visit Diagnoses       Dizzy    -  Primary   Relevant Orders   CBC with Differential/Platelet   COMPLETE METABOLIC PANEL WITH GFR   TSH   Hemoglobin A1c   VITAMIN D 25 Hydroxy (Vit-D Deficiency, Fractures)   Vitamin B12   EKG 12-Lead   Orthostatic vital signs     Other fatigue       Relevant Orders   CBC with Differential/Platelet   COMPLETE METABOLIC PANEL WITH GFR   TSH   Hemoglobin A1c   VITAMIN D 25 Hydroxy (Vit-D Deficiency, Fractures)   Vitamin B12   EKG 12-Lead   Orthostatic vital signs        Assessment and Plan  Dizziness Dizziness and lightheadedness for two weeks with nausea and fatigue. Differential includes cardiovascular causes and orthostatic hypotension. EKG and orthostatic vitals normal. Further evaluation needed. - Order blood work. - Consider CT scan of  the head.  Type 2 Diabetes Mellitus Fluctuating blood glucose levels with morning reading of 144 mg/dL. Concern about increasing A1c. Financial difficulties obtaining medications due to insurance changes. Referral to pharmacist for medication assistance made. - Coordinate with pharmacist to explore options for medication assistance. - Monitor blood glucose levels regularly.        Follow up plan: Return if symptoms worsen or fail to improve.

## 2024-03-02 NOTE — Telephone Encounter (Signed)
 Paperwork refaxed.

## 2024-03-02 NOTE — Addendum Note (Signed)
 Addended by: Della Goo F on: 03/02/2024 12:53 PM   Modules accepted: Level of Service

## 2024-03-03 ENCOUNTER — Other Ambulatory Visit: Payer: Self-pay | Admitting: Nurse Practitioner

## 2024-03-03 ENCOUNTER — Encounter: Payer: Self-pay | Admitting: Nurse Practitioner

## 2024-03-03 DIAGNOSIS — D508 Other iron deficiency anemias: Secondary | ICD-10-CM

## 2024-03-03 NOTE — Telephone Encounter (Signed)
 Copied from CRM [id number]. Topic: Clinical - Medication Refill >> Mar 03, 2024 10:42 AM Shon Hale wrote: Most Recent Primary Care Visit:  Provider: Della Goo F  Department: [facility]  Visit Type: ACUTE  Date: 03/02/2024  Medication: Testing strips - One Touch Verio Patient forgot to mention she needs more strips at yesterday's appointment.   Has the patient contacted their pharmacy? Yes Told to contact the office.   Is this the correct pharmacy for this prescription? Yes  This is the patient's preferred pharmacy:   [id number] - [place], [place] - [address] AT [facility] [address] [place] [id number] Phone: [phone] Fax: [phone]  Has the prescription been filled recently? No  Is the patient out of the medication? Yes  Has the patient been seen for an appointment in the last year OR does the patient have an upcoming appointment? Yes  Can we respond through MyChart? Yes  Agent: Please be advised that Rx refills may take up to 3 business days. We ask that you follow-up with your pharmacy.

## 2024-03-04 ENCOUNTER — Telehealth: Payer: Self-pay

## 2024-03-04 LAB — CBC WITH DIFFERENTIAL/PLATELET
Absolute Lymphocytes: 2112 {cells}/uL (ref 850–3900)
Absolute Monocytes: 557 {cells}/uL (ref 200–950)
Basophils Absolute: 32 {cells}/uL (ref 0–200)
Basophils Relative: 0.5 %
Eosinophils Absolute: 371 {cells}/uL (ref 15–500)
Eosinophils Relative: 5.8 %
HCT: 38.8 % (ref 35.0–45.0)
Hemoglobin: 11.6 g/dL — ABNORMAL LOW (ref 11.7–15.5)
MCH: 23.5 pg — ABNORMAL LOW (ref 27.0–33.0)
MCHC: 29.9 g/dL — ABNORMAL LOW (ref 32.0–36.0)
MCV: 78.5 fL — ABNORMAL LOW (ref 80.0–100.0)
MPV: 11 fL (ref 7.5–12.5)
Monocytes Relative: 8.7 %
Neutro Abs: 3328 {cells}/uL (ref 1500–7800)
Neutrophils Relative %: 52 %
Platelets: 360 10*3/uL (ref 140–400)
RBC: 4.94 10*6/uL (ref 3.80–5.10)
RDW: 19 % — ABNORMAL HIGH (ref 11.0–15.0)
Total Lymphocyte: 33 %
WBC: 6.4 10*3/uL (ref 3.8–10.8)

## 2024-03-04 LAB — TEST AUTHORIZATION

## 2024-03-04 LAB — HEMOGLOBIN A1C
Hgb A1c MFr Bld: 6.8 %{Hb} — ABNORMAL HIGH (ref <5.7)
Mean Plasma Glucose: 148 mg/dL
eAG (mmol/L): 8.2 mmol/L

## 2024-03-04 LAB — COMPLETE METABOLIC PANEL WITH GFR
AG Ratio: 1.5 (calc) (ref 1.0–2.5)
ALT: 10 U/L (ref 6–29)
AST: 18 U/L (ref 10–35)
Albumin: 4.5 g/dL (ref 3.6–5.1)
Alkaline phosphatase (APISO): 94 U/L (ref 37–153)
BUN: 13 mg/dL (ref 7–25)
CO2: 30 mmol/L (ref 20–32)
Calcium: 9.5 mg/dL (ref 8.6–10.4)
Chloride: 104 mmol/L (ref 98–110)
Creat: 0.83 mg/dL (ref 0.50–1.05)
Globulin: 3 g/dL (ref 1.9–3.7)
Glucose, Bld: 111 mg/dL — ABNORMAL HIGH (ref 65–99)
Potassium: 4.6 mmol/L (ref 3.5–5.3)
Sodium: 140 mmol/L (ref 135–146)
Total Bilirubin: 0.3 mg/dL (ref 0.2–1.2)
Total Protein: 7.5 g/dL (ref 6.1–8.1)
eGFR: 78 mL/min/{1.73_m2} (ref >=60)

## 2024-03-04 LAB — IRON,TIBC AND FERRITIN PANEL
%SAT: 9 % — ABNORMAL LOW (ref 16–45)
Ferritin: 6 ng/mL — ABNORMAL LOW (ref 16–288)
Iron: 34 ug/dL — ABNORMAL LOW (ref 45–160)
TIBC: 397 ug/dL (ref 250–450)

## 2024-03-04 LAB — TSH: TSH: 0.84 m[IU]/L (ref 0.40–4.50)

## 2024-03-04 LAB — VITAMIN D 25 HYDROXY (VIT D DEFICIENCY, FRACTURES): Vit D, 25-Hydroxy: 7 ng/mL — ABNORMAL LOW (ref 30–100)

## 2024-03-04 LAB — VITAMIN B12: Vitamin B-12: 326 pg/mL (ref 200–1100)

## 2024-03-04 NOTE — Telephone Encounter (Signed)
 Requested medication (s) are due for refill today: yes  Requested medication (s) are on the active medication list: historical med  Last refill:  03/03/24   Future visit scheduled: yes  Notes to clinic:  historical provider   Requested Prescriptions  Pending Prescriptions Disp Refills   glucose blood test strip 100 each     Sig: 1 each by Other route as needed for other. Use as instructed     Endocrinology: Diabetes - Testing Supplies Passed - 03/04/2024 10:06 AM      Passed - Valid encounter within last 12 months    Recent Outpatient Visits           2 months ago Skin abnormality   [facility] Margarita Mail, DO   3 months ago Type 2 diabetes mellitus with hyperglycemia, without long-term current use of insulin [facility])   [place] [facility] Berniece Salines, FNP   7 months ago Annual physical exam   [facility] Berniece Salines, FNP   8 months ago Essential hypertension, benign   [facility] Berniece Salines, FNP       Future Appointments             In 3 weeks Zane Herald Rudolpho Sevin, FNP [facility]            Signed Prescriptions Disp Refills   glucose blood test strip      Sig: 1 each by Other route as needed for other. Use as instructed     There is no refill protocol information for this order

## 2024-03-04 NOTE — Telephone Encounter (Signed)
 Following up on Mrs. Dawn Rice PAP [employer].Gave company a call to see if they have received pt application from 03/02/24 spoke with representative said we need to wait 5 business days for them to look into the application at this time they have not looked into yeat, we need to wait 5 days and then follow up with in 5 day.

## 2024-03-09 ENCOUNTER — Telehealth (HOSPITAL_COMMUNITY): Payer: Self-pay

## 2024-03-09 ENCOUNTER — Other Ambulatory Visit: Payer: Self-pay | Admitting: Pharmacist

## 2024-03-09 DIAGNOSIS — E1165 Type 2 diabetes mellitus with hyperglycemia: Secondary | ICD-10-CM

## 2024-03-09 NOTE — Progress Notes (Unsigned)
 03/09/2024 Name: Dawn Rice MRN: [id number] DOB: 1956-03-11  Chief Complaint  Patient presents with   Medication Assistance    Dawn Rice is a 68 y.o. year old female who presented for a telephone visit.   They were referred to the pharmacist by their PCP for assistance in managing medication access.    Subjective:  Care Team: Primary Care Provider: Berniece Salines, FNP ; Next Scheduled Visit: 03/27/2024 Cardiologist: Thomasene Ripple, DO Heart Failure Specialist: Laurey Morale, MD  Medication Access/Adherence  Current Pharmacy:  [facility] [phone] - [place], [place] - [address] AT [facility] [address] & [facility] [address] [place] [address] Phone: [phone] Fax: [phone]  [id number] - [place], [place] - [address] AT [facility] [address] [place] [address] Phone: [phone] Fax: [phone]  [id number] - [place], [place] - [address] AT [facility] [address] [place] [address] Phone: [phone] Fax: [phone]   Patient reports affordability concerns with their medications: Yes  Patient reports access/transportation concerns to their pharmacy: No  Patient reports adherence concerns with their medications:  No     Diabetes:  Current medications: Reports ran out of her Synjardy 12.04-999 mg ~1 week ago  Medications tried in the past: Rybelsus (cost)  Current glucose readings: morning fasting readings ranging 120-130s over past week; reports previously running 90s-100s  Current medication access support: Reports previously enrolled in patient assistance for Synjardy from [facility] for 2024 calendar year - Collaborating with PCP and CPhT Lillia Abed to assist patient with applying for re-enrollment in BI patient assistance program   Objective:  Lab Results  Component Value Date   HGBA1C 6.8  (H) 03/02/2024    Lab Results  Component Value Date   CREATININE 0.83 03/02/2024   BUN 13 03/02/2024   NA 140 03/02/2024   K 4.6 03/02/2024   CL 104 03/02/2024   CO2 30 03/02/2024    Lab Results  Component Value Date   CHOL 154 06/25/2023   HDL 55 06/25/2023   LDLCALC 79 06/25/2023   TRIG 112 06/25/2023   CHOLHDL 2.8 06/25/2023    Current Outpatient Medications on File Prior to Visit  Medication Sig Dispense Refill   aspirin EC 81 MG tablet Take 1 tablet (81 mg total) by mouth daily. Swallow whole. 90 tablet 3   Aspirin-Caffeine (BC FAST PAIN RELIEF PO) Take 1 packet by mouth daily as needed (pain).     BELSOMRA 20 MG TABS Take 1 tablet by mouth at bedtime as needed (sleep).     carvedilol (COREG) 25 MG tablet TAKE 1 TABLET(25 MG) BY MOUTH TWICE DAILY 60 tablet 0   glucose blood test strip 1 each by Other route as needed for other. Use as instructed     hydrocortisone 1 % ointment Apply 1 Application topically 2 (two) times daily. 30 g 0   Iron, Ferrous Sulfate, 325 (65 Fe) MG TABS Take 325 mg by mouth daily. 90 tablet 1   Ketotifen Fumarate (ALLERGY EYE DROPS OP) Place 1 drop into both eyes daily as needed (allergies).     rosuvastatin (CRESTOR) 20 MG tablet Take 1 tablet (20 mg total) by mouth daily. 90 tablet 3   Semaglutide (RYBELSUS) 3 MG TABS Take 1 tablet (3 mg total) by mouth daily. 30 tablet 0   SYNJARDY 12.04-999 MG TABS Take  1 tablet by mouth 2 (two) times daily. 90 tablet 3   Vitamin D, Ergocalciferol, (DRISDOL) 1.25 MG (50000 UNIT) CAPS capsule Take 1 capsule (50,000 Units total) by mouth every 7 (seven) days. 10 capsule 1   No current facility-administered medications on file prior to visit.      Assessment/Plan:   Diabetes: - Reviewed goal A1c, goal fasting, and goal 2 hour post prandial glucose - Reviewed dietary modifications including importance of having regular well-balanced meals and snacks throughout the day, while controlling carbohydrate portion  sizes - Collaborating with PCP and CPhT Lillia Abed to assist patient with applying for re-enrollment in BI patient assistance program  Note office faxed patient's application to [facility] on 03/02/2024 CPhT follows up with program today and reports program is unable to locate this fax; requests office resend. CPhT advises that she will refax this application to program today - Today find that patient had Extra Help subsidy in the past, but then stopped receiving it and is unsure why.  Based on reported income today, recommend patient apply for Extra Help subsidy through [employer]. Patient requests to schedule follow up appointment to complete this, once has gathered her documents - Will collaborate with PCP regarding diabetes medication management. - Recommend to check glucose, keep log of results and have this record to review at upcoming medical appointments. Patient to contact provider office sooner if needed for readings outside of established parameters or symptoms  Follow Up Plan: Clinical Pharmacist will follow up with patient by telephone on 03/11/2024 at 8:00 AM to aid patient with completing Extra Help subsidy application  Estelle Grumbles, PharmD, [facility] [phone]

## 2024-03-09 NOTE — Telephone Encounter (Signed)
 Called to confirm/remind patient of their appointment at the [facility] on 03/10/24.   Patient reminded to bring all medications and/or complete list.  Confirmed patient has transportation. Gave directions, instructed to utilize valet parking.  Confirmed appointment prior to ending call.

## 2024-03-10 ENCOUNTER — Encounter (HOSPITAL_COMMUNITY): Payer: Self-pay

## 2024-03-10 ENCOUNTER — Ambulatory Visit (HOSPITAL_COMMUNITY)
Admission: RE | Admit: 2024-03-10 | Discharge: 2024-03-10 | Disposition: A | Discharge status: Home / Self Care | Payer: Medicare Other | Source: Ambulatory Visit | Attending: Family Medicine | Admitting: Family Medicine

## 2024-03-10 VITALS — BP 130/80 | HR 91 | Wt 192.0 lb

## 2024-03-10 DIAGNOSIS — Z6835 Body mass index (BMI) 35.0-35.9, adult: Secondary | ICD-10-CM | POA: Insufficient documentation

## 2024-03-10 DIAGNOSIS — G4733 Obstructive sleep apnea (adult) (pediatric): Secondary | ICD-10-CM | POA: Diagnosis not present

## 2024-03-10 DIAGNOSIS — I272 Pulmonary hypertension, unspecified: Secondary | ICD-10-CM | POA: Diagnosis not present

## 2024-03-10 DIAGNOSIS — Z79899 Other long term (current) drug therapy: Secondary | ICD-10-CM | POA: Insufficient documentation

## 2024-03-10 DIAGNOSIS — E1165 Type 2 diabetes mellitus with hyperglycemia: Secondary | ICD-10-CM

## 2024-03-10 DIAGNOSIS — I10 Essential (primary) hypertension: Secondary | ICD-10-CM

## 2024-03-10 DIAGNOSIS — I5032 Chronic diastolic (congestive) heart failure: Secondary | ICD-10-CM | POA: Diagnosis not present

## 2024-03-10 DIAGNOSIS — D509 Iron deficiency anemia, unspecified: Secondary | ICD-10-CM | POA: Insufficient documentation

## 2024-03-10 DIAGNOSIS — E119 Type 2 diabetes mellitus without complications: Secondary | ICD-10-CM | POA: Insufficient documentation

## 2024-03-10 DIAGNOSIS — E785 Hyperlipidemia, unspecified: Secondary | ICD-10-CM | POA: Insufficient documentation

## 2024-03-10 DIAGNOSIS — I110 Hypertensive heart disease with heart failure: Secondary | ICD-10-CM | POA: Insufficient documentation

## 2024-03-10 DIAGNOSIS — I2510 Atherosclerotic heart disease of native coronary artery without angina pectoris: Secondary | ICD-10-CM

## 2024-03-10 DIAGNOSIS — E669 Obesity, unspecified: Secondary | ICD-10-CM | POA: Diagnosis not present

## 2024-03-10 LAB — BASIC METABOLIC PANEL
Anion gap: 5 (ref 5–15)
BUN: 13 mg/dL (ref 8–23)
CO2: 26 mmol/L (ref 22–32)
Calcium: 8.8 mg/dL — ABNORMAL LOW (ref 8.9–10.3)
Chloride: 109 mmol/L (ref 98–111)
Creatinine, Ser: 1.04 mg/dL — ABNORMAL HIGH (ref 0.44–1.00)
GFR, Estimated: 60 mL/min — ABNORMAL LOW (ref >60)
Glucose, Bld: 193 mg/dL — ABNORMAL HIGH (ref 70–99)
Potassium: 4.2 mmol/L (ref 3.5–5.1)
Sodium: 140 mmol/L (ref 135–145)

## 2024-03-10 MED ORDER — CARVEDILOL 25 MG PO TABS: 25.0000 mg | ORAL_TABLET | Freq: Two times a day (BID) | ORAL | 3 refills | Status: AC

## 2024-03-10 MED ORDER — SPIRONOLACTONE 25 MG PO TABS: 25.0000 mg | ORAL_TABLET | Freq: Every day | ORAL | 3 refills | Status: DC

## 2024-03-10 MED ORDER — SPIRONOLACTONE 25 MG PO TABS: 12.5000 mg | ORAL_TABLET | Freq: Every day | ORAL | 3 refills | Status: DC

## 2024-03-10 NOTE — Telephone Encounter (Signed)
 Following up on pt PAP [employer] with a representative they still have not received application fax on 03/02/24 ,I have fax it today 03/09/24 and will follow up with in 5 days to make sure they have received pt application.

## 2024-03-10 NOTE — Patient Instructions (Addendum)
 START Spironolactone 25 mg daily.  Labs done today, your results will be available in MyChart, we will contact you for abnormal readings.  Repeat blood work in 1 week.  Genetic testing has been collected, this has to be sent to [facility] for processing and can take 1-2 weeks for [place] to get results back.  We will let you know the results once reviewed by your provider.  You have been referred to the HEART CARE PHARMACY. They will call you to arrange your appointment.  PLEASE CHECK YOUR BLOOD PRESSURE DAILY AND CALL THE OFFICE IN IT IS HIGHER THAN 140 SYSTOLIC   Your physician recommends that you schedule a follow-up appointment in: 3 months.  If you have any questions or concerns before your next appointment please send [place] a message through [place] or call our office at [phone].    TO LEAVE A MESSAGE FOR THE NURSE SELECT OPTION 2, PLEASE LEAVE A MESSAGE INCLUDING: YOUR NAME DATE OF BIRTH CALL BACK NUMBER REASON FOR CALL**this is important as we prioritize the call backs  YOU WILL RECEIVE A CALL BACK THE SAME DAY AS LONG AS YOU CALL BEFORE 4:00 PM  At the [facility], you and your health needs are our priority. As part of our continuing mission to provide you with exceptional heart care, we have created designated Provider Care Teams. These Care Teams include your primary Cardiologist (physician) and Advanced Practice Providers (APPs- Physician Assistants and Nurse Practitioners) who all work together to provide you with the care you need, when you need it.   You may see any of the following providers on your designated Care Team at your next follow up: Dr Arvilla Meres Dr Marca Ancona Dr. Dorthula Nettles Dr. Clearnce Hasten Amy Filbert Schilder, NP Robbie Lis, [place] [facility] [place], [place] Brynda Peon, NP [place] Lee, NP Clarisa Kindred, NP Karle Plumber, PharmD Enos Fling, PharmD   Please be sure to bring in all your medications bottles to every  appointment.    Thank you for choosing [facility]

## 2024-03-10 NOTE — Progress Notes (Signed)
 ADVANCED HEART FAILURE CLINIC NOTE  PCP: Berniece Salines, FNP Cardiology: Dr. Servando Salina HF Cardiology: Dr. Shirlee Latch  CC: HF follow up  HPI: 68 y.o. with history of HTN, hyperlipidemia, and diabetes was referred by Dr. Servando Salina for evaluation of diastolic CHF/pulmonary hypertension.  Patient has had some exertional dyspnea for a year.  4/23 echo showed EF 70-75%, severe LVH with some asymmetry of the basal septum, LV mid cavitary gradient peak 29 at rest and 51 with Valsalva, no SAM, normal RV.  Coronary CTA in 4/23 showed a moderate proximal LCx stenosis that did not appear hemodynamically significant by FFR. Her beta blocker was titrated up with LV mid-cavity gradient.  Stress echo was done in 12/23, showing that LV mid cavity gradient increased from 12 peak to 26 peak with exercise, PASP increased from 30 to 48 with exercise.  No wall motion abnormalities with exercise.   Also of note, patient had Fe deficiency anemia.  She was seen by GI and found to be guaiac negative. No BRBPR/melena. Given IV iron after initial appointment.   Patient had RHC/LHC in 4/24 showing nonobstructive CAD, mildly elevated RA pressure and normal PCWP.   Cardiac MRI in 7/24 showed EF 65%, moderate asymmetric septal hypertrophy with no mitral valve systolic anterior motion, normal RV EF 53%, no delayed enhancement.   Seen in ED 02/06/24 for atypical chest pain. Workup was negative and she was discharged home the same day.   Today she returns for HF follow up. Overall feeling fine. Main complaint if occasional dizziness and on-going fatigue. She has SOB walking up steps but otherwise no dyspnea with ADLs or walking on flat ground. Denies palpitations, abnormal bleeding, CP, edema, or PND/Orthopnea. Appetite ok. No fever or chills. Weight at home 170 pounds. Taking all medications. Interested in losing weight.  ECG: none ordered today.  Labs (11/23): hgb 7.6, K 4.6, creatinine 0.8 Labs (3/24): BNP 18.5, LDL 69, ferritin 7,  transferrin saturation 8%, TSH normal, hgb 9.3, K 4.3, creatinine 0.92 Labs (7/24): LDL 79, K 4.7, creatinine 0.98 Labs (3/25): K 4.6, Scr 0.83, A1C 6.8, TSH 0.84, hgb 11.6  PMH: 1. HTN 2. Type 2 diabetes 3. Hyperlipidemia 4. Epilepsy with h/o seizures. 5. Fe deficiency anemia 6. Remote tobacco use (quit 2009).  7. Chronic diastolic CHF:  ?Variant of hypertrophic cardiomyopathy.  Echo (4/23) with EF 70-75%, severe LVH with some asymmetry of the basal septum, LV mid cavitary gradient peak 29 at rest and 51 with Valsalva, no SAM, normal RV.  - Stress echo (12/23): LV mid cavity gradient increased from 12 peak to 26 peak with exercise, PASP increased from 30 to 48 with exercise.  No wall motion abnormalities with exercise.  - RHC (4/24): mean RA 9, PA 31/11 mean 22, mean PCWP 13, CI 3.39 - Cardiac MRI (7/24): EF 65%, moderate asymmetric septal hypertrophy with no mitral valve systolic anterior motion, normal RV EF 53%, no delayed enhancement. 8. CAD: Coronary CTA in 4/23 showed 50-69% proximal LCx stenosis, FFR negative.  - Stress echo in 12/23 as above was negative for ischemia.  - LHC (4/24): Mild nonobstructive CAD.  9. OSA: Sleep study in 9/24 with OSA.   Social History   Socioeconomic History   Marital status: Widowed    Spouse name: Not on file   Number of children: 4   Years of education: Not on file   Highest education level: Not on file  Occupational History   Not on file  Tobacco Use  Smoking status: Former    Current packs/day: 0.00    Average packs/day: 0.5 packs/day for 20.0 years (10.0 ttl pk-yrs)    Types: Cigarettes    Start date: 50    Quit date: 2009    Years since quitting: 16.2   Smokeless tobacco: Never  Vaping Use   Vaping status: Never Used  Substance and Sexual Activity   Alcohol use: Yes    Comment: 07/22/2017 "glass of wine maybe once/month"   Drug use: No   Sexual activity: Not Currently  Other Topics Concern   Not on file  Social History  Narrative   Widowed mother of 4.   Quit smoking in 2008.   Occasional alcohol.   Social Drivers of Corporate investment banker Strain: Low Risk  (08/22/2023)   Overall Financial Resource Strain (CARDIA)    Difficulty of Paying Living Expenses: Not hard at all  Food Insecurity: No Food Insecurity (08/22/2023)   Hunger Vital Sign    Worried About Running Out of Food in the Last Year: Never true    Ran Out of Food in the Last Year: Never true  Transportation Needs: No Transportation Needs (08/22/2023)   PRAPARE - Administrator, Civil Service (Medical): No    Lack of Transportation (Non-Medical): No  Physical Activity: Inactive (08/22/2023)   Exercise Vital Sign    Days of Exercise per Week: 0 days    Minutes of Exercise per Session: 0 min  Stress: Stress Concern Present (08/22/2023)   [employer] of Occupational Health - Occupational Stress Questionnaire    Feeling of Stress : To some extent  Social Connections: Moderately Integrated (08/22/2023)   Social Connection and Isolation Panel [NHANES]    Frequency of Communication with Friends and Family: More than three times a week    Frequency of Social Gatherings with Friends and Family: Once a week    Attends Religious Services: More than 4 times per year    Active Member of [employer] or Organizations: Yes    Attends Banker Meetings: 1 to 4 times per year    Marital Status: Widowed  Intimate Partner Violence: Not At Risk (08/22/2023)   Humiliation, Afraid, Rape, and Kick questionnaire    Fear of Current or Ex-Partner: No    Emotionally Abused: No    Physically Abused: No    Sexually Abused: No   FH: Mother with "hole in her heart," father with CAD.   ROS: All systems reviewed and negative except as per HPI.   Current Outpatient Medications  Medication Sig Dispense Refill   Aspirin-Caffeine (BC FAST PAIN RELIEF PO) Take 1 packet by mouth daily as needed (pain).     BELSOMRA 20 MG TABS Take 1 tablet by  mouth at bedtime as needed (sleep).     glucose blood test strip 1 each by Other route as needed for other. Use as instructed     hydrocortisone 1 % ointment Apply 1 Application topically 2 (two) times daily. (Patient taking differently: Apply 1 Application topically 2 (two) times daily. As needed) 30 g 0   Iron, Ferrous Sulfate, 325 (65 Fe) MG TABS Take 325 mg by mouth daily. 90 tablet 1   Ketotifen Fumarate (ALLERGY EYE DROPS OP) Place 1 drop into both eyes daily as needed (allergies).     rosuvastatin (CRESTOR) 20 MG tablet Take 1 tablet (20 mg total) by mouth daily. 90 tablet 3   SYNJARDY 12.04-999 MG TABS Take 1 tablet by mouth  2 (two) times daily. 90 tablet 3   carvedilol (COREG) 25 MG tablet Take 1 tablet (25 mg total) by mouth 2 (two) times daily with a meal. 180 tablet 3   spironolactone (ALDACTONE) 25 MG tablet Take 1 tablet (25 mg total) by mouth daily. 45 tablet 3   Vitamin D, Ergocalciferol, (DRISDOL) 1.25 MG (50000 UNIT) CAPS capsule Take 1 capsule (50,000 Units total) by mouth every 7 (seven) days. (Patient not taking: Reported on 03/10/2024) 10 capsule 1   No current facility-administered medications for this encounter.   BP 130/80   Pulse 91   Wt 87.1 kg   SpO2 97%   BMI 35.12 kg/m   Wt Readings from Last 3 Encounters:  03/10/24 87.1 kg (192 lb)  03/02/24 85.5 kg (188 lb 8 oz)  02/06/24 85.8 kg (189 lb 2.5 oz)   Physical Exam General:  NAD. No resp difficulty, walked into clinic HEENT: Normal Neck: Supple. No JVD. Cor: Regular rate & rhythm. No rubs, gallops or murmurs. Lungs: Clear Abdomen: Soft, obese, nontender, nondistended.  Extremities: No cyanosis, clubbing, rash, edema Neuro: Alert & oriented x 3, moves all 4 extremities w/o difficulty. Affect pleasant.  Assessment/Plan: 1. Chronic diastolic CHF:  Echo in 4/23 showed EF 70-75%, severe LVH with some asymmetry of the basal septum, LV mid cavitary gradient peak 29 at rest and 51 with Valsalva, no SAM, normal  RV.  Coronary CTA in 4/23 showed a moderate proximal LCx stenosis that did not appear hemodynamically significant by FFR. Her beta blocker was titrated up with LV mid-cavity gradient.  Stress echo was done in 12/23, showing that LV mid cavity gradient increased from 12 peak to 26 peak with exercise, PASP increased from 30 to 48 with exercise.  No wall motion abnormalities with exercise.  LHC/RHC in 4/24 showed no significant coronary disease and mildly elevated RA pressure.  Cardiac MRI in 7/24 showed EF 65%, moderate asymmetric septal hypertrophy with no mitral valve systolic anterior motion, normal RV EF 53%, no delayed enhancement.  She could have a lower risk variant of hypertrophic cardiomyopathy.  She does not have evidence by MRI for cardiac amyloidosis. She is euvolemic on exam, NYHA class II symptoms. - Start spironolactone 25 mg daily. BMET today, repeat BMET in 1 week. - Unable to get insurance approval for finerenone - Continue Coreg 25 mg bid. - Unable to take diltiazem CD (negative inotropy with mild mid-cavity gradient) due to rash.  - We discussed genetic testing to look for mutations associated with HCM, she is interested in this. Obtain today. 2. CAD: Coronary CTA in 4/23 with moderate LCx stenosis, not hemodynamically significant by FFR. LHC in 4/24 showed nonobstructive CAD. No further episodes of chest pain. - Continue ASA + rosuvastatin.  3. Fe deficiency anemia: She has had IV Fe.  - Defer management to PCP. 4. HTN: BP mildly elevated lately.  - Start spiro as above - Given Rx for BP cuff. I asked her to check BP daily and log. Notify clinic if sBP consistently >140.  5. OSA: Mild by sleep study, not on CPAP. - Needs weight loss. 6. Obesity: Body mass index is 35.12 kg/m. - We discussed GLP1RA. She is interested. She has DM2, OSA and CAD, hopefully insurance will cover. - Refer to PharmD for semaglutide vs tirzepatide.  Follow up in 3 months with Dr. Lawanda Cousins, FNP-BC 03/10/24

## 2024-03-10 NOTE — Progress Notes (Signed)
 [facility] genetic testing collected via blood per Dr Shirlee Latch.  Order form completed, signed and shipped with sample by FedEx to [facility].

## 2024-03-11 ENCOUNTER — Other Ambulatory Visit: Payer: Self-pay | Admitting: Pharmacist

## 2024-03-11 ENCOUNTER — Encounter: Payer: Self-pay | Admitting: Pharmacist

## 2024-03-11 ENCOUNTER — Telehealth: Payer: Self-pay

## 2024-03-11 ENCOUNTER — Other Ambulatory Visit: Payer: Self-pay | Admitting: Nurse Practitioner

## 2024-03-11 DIAGNOSIS — E1165 Type 2 diabetes mellitus with hyperglycemia: Secondary | ICD-10-CM

## 2024-03-11 MED ORDER — OZEMPIC (0.25 OR 0.5 MG/DOSE) 2 MG/3ML ~~LOC~~ SOPN: 0.2500 mg | PEN_INJECTOR | SUBCUTANEOUS | 0 refills | Status: DC

## 2024-03-11 NOTE — Telephone Encounter (Signed)
 Gave Mrs. Dawn Rice a call to follow up on pt application for [employer] pt gave consent to fill PAP Novo Nordisk (Ozempic)on line we also fax provider portion today. Will follow up in a few days.

## 2024-03-11 NOTE — Telephone Encounter (Signed)
 REFAXED

## 2024-03-11 NOTE — Telephone Encounter (Signed)
 Patient received call from [employer] and was advised the prescription page is missing. Prescription page needing to be faxed today or application process will have to restart.   Please assist further.

## 2024-03-11 NOTE — Patient Instructions (Addendum)
 Goals Addressed             This Visit's Progress    Pharmacy Goals       The goal A1c is less than 7%. This is the best way to reduce the risk of the long term complications of diabetes, including heart disease, kidney disease, eye disease, strokes, and nerve damage. An A1c of less than 7% corresponds with fasting sugars less than 130 and 2 hour after meal sugars less than 180.   Estelle Grumbles, PharmD, [facility] [phone]

## 2024-03-11 NOTE — Patient Instructions (Signed)
 Start Ozempic 0.25 mg once weekly. This medication may cause stomach upset, queasiness, or constipation, especially when first starting. This generally improves over time. Call our office if these symptoms occur and worsen, or if you have severe symptoms such as vomiting, diarrhea, or stomach pain.    The following is the how to use video for Ozempic:   [email or web address]   Please review this video and let us know if you have any questions before getting started.     Please watch the mail for an envelope from [facility] containing the patient assistance program application. Please complete this application and bring to office to have it faxed back to Attention: Lillia Abed at Fax # [phone] along with a copy of your Medicare Part D prescription card and a copy of your proof of income document.   If you need to call Darien Ramus, you can reach her at [phone].   Thank you!   Estelle Grumbles, PharmD, [facility] [phone]

## 2024-03-11 NOTE — Progress Notes (Signed)
   03/11/2024  Patient ID: Dawn Rice, female   DOB: 1956-02-01, 68 y.o.   MRN: [id number]  Outreach to patient today as scheduled to assist with completing Extra Help subsidy application online via [employer]   Counsel to expect response regarding approval or denial from Social Security via mail within the next 4 weeks after submitting application online. Advise that even if he is denied, to retain denial letter as this will be needed for applying for manufacturer patient assistance program.   Patient interested in starting Ozempic for blood sugar control, but also for weight loss benefit as discussed with heart failure specialist yesterday. - Note based on reported income, unclear if patient will qualify for Extra Help subsidy. Note patient would qualify for Ozempic patient assistance if denied Extra Help subsidy.  Collaborated with PCP regarding medication management for patient.  - Provider agrees with plan for patient to start Ozempic 0.25 mg once weekly and confirms sample available at office for patient to get started.  Follow up with patient  - Counsel on Ozempic, including mechanism of action, side effects, and benefits. No personal or family history of medullary thyroid cancer, personal history of pancreatitis or gallbladder disease. Counseled on potential side effects of nausea, stomach upset, queasiness, constipation, and that these generally improve over time. Advised to contact our office with more severe symptoms, including nausea, diarrhea, stomach pain. Patient verbalized understanding. - Send patient MyChart message with manufacturer "how to take" video for patient to review prior to getting started. - Will collaborate with provider, CPhT, and patient to pursue assistance for Ozempic from [employer] patient assistance     Follow Up Plan: Clinical Pharmacist will follow up with patient by telephone on 04/08/2024 at 10:30 AM    Estelle Grumbles, PharmD,  [facility] [phone]

## 2024-03-13 ENCOUNTER — Encounter: Payer: Self-pay | Admitting: Nurse Practitioner

## 2024-03-13 ENCOUNTER — Ambulatory Visit: Admitting: Nurse Practitioner

## 2024-03-13 VITALS — BP 136/90 | HR 78 | Temp 97.8°F | Resp 18 | Ht 62.0 in | Wt 192.6 lb

## 2024-03-13 DIAGNOSIS — E538 Deficiency of other specified B group vitamins: Secondary | ICD-10-CM | POA: Diagnosis not present

## 2024-03-13 DIAGNOSIS — G5602 Carpal tunnel syndrome, left upper limb: Secondary | ICD-10-CM | POA: Diagnosis not present

## 2024-03-13 MED ORDER — CYANOCOBALAMIN 1000 MCG/ML IJ SOLN
1000.0000 ug | Freq: Once | INTRAMUSCULAR | Status: AC
  Administered 2024-03-13: 1000 ug via INTRAMUSCULAR

## 2024-03-13 NOTE — Progress Notes (Signed)
 BP (!) 136/90   Pulse 78   Temp 97.8 F (36.6 C)   Resp 18   Ht 5\' 2"  (1.575 m)   Wt 192 lb 9.6 oz (87.4 kg)   SpO2 93%   BMI 35.23 kg/m    Subjective:    Patient ID: Dawn Rice, female    DOB: 05/20/1956, 68 y.o.   MRN: [id number]  HPI: Dawn Rice is a 68 y.o. female  Chief Complaint  Patient presents with   Wrist Pain    Left onset 3 weeks and getting worse    Discussed the use of AI scribe software for clinical note transcription with the patient, who gave verbal consent to proceed.  History of Present Illness Dawn Rice is a 68 year old female with a history of carpal tunnel syndrome who presents with left wrist pain.  She has been experiencing left wrist pain for approximately three weeks, which is exacerbated by twisting her wrist in bed and performing certain movements, such as squeezing her hands together. No trauma to the area is reported. She also notes persistent numbness in her fingers.  She has a history of carpal tunnel syndrome and owns a wrist splint, which she wears occasionally, but not consistently every night.         11/27/2023    1:49 PM 08/22/2023   10:23 AM 07/25/2023    2:02 PM  Depression screen PHQ 2/9  Decreased Interest 0 0 0  Down, Depressed, Hopeless 0 0 0  PHQ - 2 Score 0 0 0  Altered sleeping 0    Tired, decreased energy 0    Change in appetite 0    Feeling bad or failure about yourself  0    Trouble concentrating 0    Moving slowly or fidgety/restless 0    Suicidal thoughts 0    PHQ-9 Score 0      Relevant past medical, surgical, family and social history reviewed and updated as indicated. Interim medical history since our last visit reviewed. Allergies and medications reviewed and updated.  Review of Systems  Ten systems reviewed and is negative except as mentioned in HPI      Objective:    BP (!) 136/90   Pulse 78   Temp 97.8 F (36.6 C)   Resp 18   Ht 5\' 2"  (1.575 m)   Wt 192 lb 9.6 oz  (87.4 kg)   SpO2 93%   BMI 35.23 kg/m    Wt Readings from Last 3 Encounters:  03/13/24 192 lb 9.6 oz (87.4 kg)  03/10/24 192 lb (87.1 kg)  03/02/24 188 lb 8 oz (85.5 kg)    Physical Exam  Physical Exam GENERAL: Alert, cooperative, well developed, no acute distress. HEENT: Normocephalic, normal oropharynx, moist mucous membranes. CHEST: Clear to auscultation bilaterally, no wheezes, rhonchi, or crackles. CARDIOVASCULAR: Normal heart rate and rhythm, S1 and S2 normal without murmurs. ABDOMEN: Soft, non-tender, non-distended, without organomegaly, normal bowel sounds. EXTREMITIES: No cyanosis or edema. MUSCULOSKELETAL: Tenderness on palpation of left wrist. Pain on Phalen's test and reverse Phalen's test of left wrist. NEUROLOGICAL: Cranial nerves grossly intact, moves all extremities without gross motor or sensory deficit.   Results for orders placed or performed during the hospital encounter of 03/10/24  Basic Metabolic Panel (BMET)   Collection Time: 03/10/24 11:51 AM  Result Value Ref Range   Sodium 140 135 - 145 mmol/L   Potassium 4.2 3.5 - 5.1 mmol/L   Chloride 109 98 -  111 mmol/L   CO2 26 22 - 32 mmol/L   Glucose, Bld 193 (H) 70 - 99 mg/dL   BUN 13 8 - 23 mg/dL   Creatinine, Ser 1.61 (H) 0.44 - 1.00 mg/dL   Calcium 8.8 (L) 8.9 - 10.3 mg/dL   GFR, Estimated 60 (L) >60 mL/min   Anion gap 5 5 - 15       Assessment & Plan:   Problem List Items Addressed This Visit   None Visit Diagnoses       B12 deficiency    -  Primary   Relevant Medications   cyanocobalamin (VITAMIN B12) injection 1,000 mcg (Completed)        Assessment and Plan Assessment & Plan Carpal Tunnel Syndrome Chronic left wrist pain for three weeks, likely due to carpal tunnel syndrome, with symptoms of finger numbness and pain exacerbated by certain movements. No recent trauma reported. Previous orthopedic evaluation suggested surgery due to severity, but she is hesitant to proceed with surgical  intervention. - Instruct to wear wrist splint continuously for five days, then at night if pain improves. - Recommend Voltaren gel for topical pain relief. - Advise follow-up with orthopedic specialist at [facility] in [facility] if symptoms do not improve.      Follow up plan: Return if symptoms worsen or fail to improve.

## 2024-03-17 ENCOUNTER — Ambulatory Visit (INDEPENDENT_AMBULATORY_CARE_PROVIDER_SITE_OTHER)

## 2024-03-17 ENCOUNTER — Encounter: Payer: Self-pay | Admitting: Pharmacist

## 2024-03-17 ENCOUNTER — Ambulatory Visit
Admission: RE | Admit: 2024-03-17 | Discharge: 2024-03-17 | Disposition: A | Discharge status: Home / Self Care | Source: Ambulatory Visit | Attending: Cardiology | Admitting: Cardiology

## 2024-03-17 VITALS — Ht 62.0 in | Wt 192.0 lb

## 2024-03-17 DIAGNOSIS — Z6835 Body mass index (BMI) 35.0-35.9, adult: Secondary | ICD-10-CM | POA: Diagnosis not present

## 2024-03-17 DIAGNOSIS — E119 Type 2 diabetes mellitus without complications: Secondary | ICD-10-CM | POA: Diagnosis not present

## 2024-03-17 DIAGNOSIS — E1165 Type 2 diabetes mellitus with hyperglycemia: Secondary | ICD-10-CM | POA: Diagnosis not present

## 2024-03-17 DIAGNOSIS — Z79899 Other long term (current) drug therapy: Secondary | ICD-10-CM | POA: Diagnosis not present

## 2024-03-17 DIAGNOSIS — I110 Hypertensive heart disease with heart failure: Secondary | ICD-10-CM | POA: Insufficient documentation

## 2024-03-17 DIAGNOSIS — E669 Obesity, unspecified: Secondary | ICD-10-CM

## 2024-03-17 DIAGNOSIS — Z7985 Long-term (current) use of injectable non-insulin antidiabetic drugs: Secondary | ICD-10-CM | POA: Diagnosis not present

## 2024-03-17 DIAGNOSIS — E785 Hyperlipidemia, unspecified: Secondary | ICD-10-CM | POA: Diagnosis not present

## 2024-03-17 DIAGNOSIS — Z87891 Personal history of nicotine dependence: Secondary | ICD-10-CM | POA: Diagnosis not present

## 2024-03-17 DIAGNOSIS — I5032 Chronic diastolic (congestive) heart failure: Secondary | ICD-10-CM | POA: Diagnosis not present

## 2024-03-17 LAB — BASIC METABOLIC PANEL
Anion gap: 8 (ref 5–15)
BUN: 13 mg/dL (ref 8–23)
CO2: 22 mmol/L (ref 22–32)
Calcium: 9.4 mg/dL (ref 8.9–10.3)
Chloride: 111 mmol/L (ref 98–111)
Creatinine, Ser: 1.02 mg/dL — ABNORMAL HIGH (ref 0.44–1.00)
GFR, Estimated: >60 mL/min (ref >60)
Glucose, Bld: 138 mg/dL — ABNORMAL HIGH (ref 70–99)
Potassium: 4.1 mmol/L (ref 3.5–5.1)
Sodium: 141 mmol/L (ref 135–145)

## 2024-03-17 NOTE — Patient Instructions (Addendum)
 GLP1 Agonist Titration Plan:  Will plan to follow the titration plan as below, pending patient is tolerating each dose before increasing to the next. Can slow titration if needed for tolerability.      Ozempic:  -Month 1: Inject Ozempic 0.25 mg SQ once weekly x 4 weeks -Month 2: Inject Ozempic 0.5 mg  SQ once weekly x 4 weeks -Month 3: Inject Ozempic 1 mg SQ once weekly x 4 weeks -Month 4+: Inject Ozempic 2 mg SQ once weekly   Meal Planning   Meal planning is the key to setting you up for success. Here are some examples of healthy meal options.   Breakfast:     Option 1:  Omelette with vegetables (1 egg, spinach, mushrooms, or other vegetable of your choice), 2 slices whole-grain toast, tip of thumb size butter or soft margarine,  cup low-fat milk or yogurt    Option 2: steel-cut rolled oats (? cup dry), 1 tbsp peanut butter added to cooked oats,  cup low-fat milk.   Option 3: 2 slices whole-grain or rye toast with avocado spread ( small avocado mased with herbs and pepper to taste), 1 poached egg or sunnyside up (cooked to your liking)   Option 4:  cup plain 0% [place] yogurt topped with  cup berries and  cup walnuts or almonds, 2 slices whole-grain or rye toast, tip of thumb size soft margarine/butter   Lunch:    Option 1: 2 cups red lentil soup, green salad with 1 tbsp homemade vinaigrette (extra virgin olive oil and vinegar of choice plus spices)   Option 2: 3 oz. roasted chicken, 2 slices whole-grain bread, 2 tsp mayonnaise, mustard, lettuce, tomato if desired, 1 fruit (example: medium-sized apple or small pear)   Option 3: 3 oz. tuna packed in water, 1 whole-wheat pita (6 inch), 2 tsp mayonnaise, lettuce, tomato, or other non-starchy vegetable of your choice, 1 fruit (example: medium-sized apple or small pear)   Option 4: 1 serving of garden veggie buddha bowl with lentils and tahini sauce and 1 cup berries topped with  cup plain 0% Greek yogurt   Dinner:    Option 1:  1 serving roasted cauliflower salad, 3-4 oz.  grilled or baked pork loin chop, 1/2 cup mashed potato, or brown rice or quinoa    Option 2: 1 serving fish (baked, grilled or air fried), green salad, 1 tbsp homemade vinaigrette,  cup cooked couscous   Option 3: 1 cup cooked whole grained pasta (example: spaghetti, spirals, macaroni),  cup favorite pasta sauce (preferably homemade), 3-4 oz.  grilled or baked chicken, green salad, 1 tbsp homemade vinaigrette   Option 4: 1 serving oven roasted salmon,  cup mashed sweet potato or couscous or brown rice or quinoa, broccoli (steamed or roasted)   Healthy snacks:    Carrots or celery with 1 tbsp of hummus    1 medium-sized fruit (apple or orange)   1 cup plain 0% [place] yogurt with  cup berries   Half apple, sliced, with 1 tbsp (15 mL) peanut or almond butter

## 2024-03-17 NOTE — Progress Notes (Signed)
 Patient ID: OLEAN SANGSTER                 DOB: Aug 25, 1956                    MRN: [id number]     HPI: ENEZ MONAHAN is a 68 y.o. female patient referred to pharmacy clinic by Melany Guernsey to initiate GLP1-RA therapy. PMH is significant for HTN, hyperlipidemia, and diabetes , and obesity. Most recent BMI 35.23 kg/m .  Baseline weight and BMI: 192 lbs 35.23 kg/m  Current weight and BMI: 192 lbs 35.23  kg/m  Current meds that affect weight: Ozempic   Patient's PCP has started Ozempic for diabetes management. I spoke to patient over the phone to confirm whether patient was provided information on Ozempic - dose titration schedule common side effects, its management and importance of lifestyle intervention to get max response for weight loss along with BG lowering effect. Patient states she has not received any of those information    Diet: normally eats healthy home coked meals.  Drink: water Snacks: none   Exercise: stays busy with grand child, willing to set exercise schedule   Family History:  Relation Problem Comments  Mother Cataracts   Diabetes   Heart failure   Hypertension     Father Coronary artery disease   Diabetes     Sister Retinitis pigmentosa     Social History:  Alcohol: 4 std drinks per week  Smoking: quit smoking 2009  Labs: Lab Results  Component Value Date   HGBA1C 6.8 (H) 03/02/2024    Wt Readings from Last 1 Encounters:  03/13/24 192 lb 9.6 oz (87.4 kg)    BP Readings from Last 1 Encounters:  03/13/24 (!) 136/90   Pulse Readings from Last 1 Encounters:  03/13/24 78       Component Value Date/Time   CHOL 154 06/25/2023 1019   TRIG 112 06/25/2023 1019   HDL 55 06/25/2023 1019   CHOLHDL 2.8 06/25/2023 1019   VLDL 22 03/14/2023 1429   LDLCALC 79 06/25/2023 1019    Past Medical History:  Diagnosis Date   Cataract    Epilepsy (HCC)    Gout    History of blood transfusion    "when I got my hysterectomy" (07/22/2017)    Hyperlipidemia    Hypertension    Seizures (HCC)    "from epilepsy; the kind where I space out; last one was ~ 1 yr ago" (07/22/2017)   Type II diabetes mellitus (HCC)     Current Outpatient Medications on File Prior to Visit  Medication Sig Dispense Refill   Aspirin-Caffeine (BC FAST PAIN RELIEF PO) Take 1 packet by mouth daily as needed (pain).     BELSOMRA 20 MG TABS Take 1 tablet by mouth at bedtime as needed (sleep).     carvedilol (COREG) 25 MG tablet Take 1 tablet (25 mg total) by mouth 2 (two) times daily with a meal. 180 tablet 3   glucose blood test strip 1 each by Other route as needed for other. Use as instructed     hydrocortisone 1 % ointment Apply 1 Application topically 2 (two) times daily. (Patient taking differently: Apply 1 Application topically 2 (two) times daily. As needed) 30 g 0   Iron, Ferrous Sulfate, 325 (65 Fe) MG TABS Take 325 mg by mouth daily. 90 tablet 1   Ketotifen Fumarate (ALLERGY EYE DROPS OP) Place 1 drop into both eyes daily as needed (  allergies).     rosuvastatin (CRESTOR) 20 MG tablet Take 1 tablet (20 mg total) by mouth daily. 90 tablet 3   Semaglutide,0.25 or 0.5MG /DOS, (OZEMPIC, 0.25 OR 0.5 MG/DOSE,) 2 MG/3ML SOPN Inject 0.25 mg into the skin once a week. 3 mL 0   spironolactone (ALDACTONE) 25 MG tablet Take 1 tablet (25 mg total) by mouth daily. 45 tablet 3   SYNJARDY 12.04-999 MG TABS Take 1 tablet by mouth 2 (two) times daily. 90 tablet 3   Vitamin D, Ergocalciferol, (DRISDOL) 1.25 MG (50000 UNIT) CAPS capsule Take 1 capsule (50,000 Units total) by mouth every 7 (seven) days. 10 capsule 1   No current facility-administered medications on file prior to visit.    Allergies  Allergen Reactions   Contrave [Naltrexone-Bupropion Hcl Er] Nausea And Vomiting   Metformin And Related Nausea Only   Penicillins Itching    Has patient had a PCN reaction causing immediate rash, facial/tongue/throat swelling, SOB or lightheadedness with hypotension: No Has  patient had a PCN reaction causing severe rash involving mucus membranes or skin necrosis: No Has patient had a PCN reaction that required hospitalization No Has patient had a PCN reaction occurring within the last 10 years: No If all of the above answers are "NO", then may proceed with Cephalosporin use.     Assessment/Plan:  1. Weight loss/T2DM - patient was put on Ozempic by PCP for BG management she is on Synjardy 12.04/999 twice daily for DM management. Needed education on dose titration, common side effects, its management and weight loss benefit of Ozempic . Confirmed patient has no personal or family history of medullary thyroid carcinoma (MTC) or Multiple Endocrine Neoplasia syndrome type 2 (MEN 2). Injection technique reviewed at today's visit.  Advised patient on common side effects including nausea, diarrhea, dyspepsia, decreased appetite, and fatigue. Counseled patient on reducing meal size and how to titrate medication to minimize side effects. Counseled patient to call if intolerable side effects or if experiencing dehydration, abdominal pain, or dizziness. Patient will adhere to dietary modifications and will target at least 150 minutes of moderate intensity exercise weekly.   Follow up in 1 month via telephone for tolerability update and dose titration.

## 2024-03-18 DIAGNOSIS — L710 Perioral dermatitis: Secondary | ICD-10-CM | POA: Diagnosis not present

## 2024-03-18 NOTE — Telephone Encounter (Signed)
 Following up on pt PAP per [employer] pt has been APPROVED for [employer] for 2025,spoke with pt and is aware she will be receiving it with in 10-14 days,let pt know if there is any question to give [place] a call back

## 2024-03-25 NOTE — Telephone Encounter (Signed)
 Following up with [employer] on pt application they ask to re-fax provider portion they claim they have not received.will be faxing today 03/24/24

## 2024-03-26 NOTE — Progress Notes (Deleted)
   There were no vitals taken for this visit.   Subjective:    Patient ID: Dawn Rice, female    DOB: 09/14/1956, 68 y.o.   MRN: [id number]  HPI: Dawn Rice is a 68 y.o. female  No chief complaint on file.   Discussed the use of AI scribe software for clinical note transcription with the patient, who gave verbal consent to proceed.  History of Present Illness          11/27/2023    1:49 PM 08/22/2023   10:23 AM 07/25/2023    2:02 PM  Depression screen PHQ 2/9  Decreased Interest 0 0 0  Down, Depressed, Hopeless 0 0 0  PHQ - 2 Score 0 0 0  Altered sleeping 0    Tired, decreased energy 0    Change in appetite 0    Feeling bad or failure about yourself  0    Trouble concentrating 0    Moving slowly or fidgety/restless 0    Suicidal thoughts 0    PHQ-9 Score 0      Relevant past medical, surgical, family and social history reviewed and updated as indicated. Interim medical history since our last visit reviewed. Allergies and medications reviewed and updated.  Review of Systems  Per HPI unless specifically indicated above     Objective:    There were no vitals taken for this visit.  {Vitals History (Optional):23777} Wt Readings from Last 3 Encounters:  03/17/24 192 lb (87.1 kg)  03/13/24 192 lb 9.6 oz (87.4 kg)  03/10/24 192 lb (87.1 kg)    Physical Exam Physical Exam    Results for orders placed or performed during the hospital encounter of 03/17/24  Basic Metabolic Panel (BMET)   Collection Time: 03/17/24  8:59 AM  Result Value Ref Range   Sodium 141 135 - 145 mmol/L   Potassium 4.1 3.5 - 5.1 mmol/L   Chloride 111 98 - 111 mmol/L   CO2 22 22 - 32 mmol/L   Glucose, Bld 138 (H) 70 - 99 mg/dL   BUN 13 8 - 23 mg/dL   Creatinine, Ser 4.09 (H) 0.44 - 1.00 mg/dL   Calcium 9.4 8.9 - 81.1 mg/dL   GFR, Estimated >91 >47 mL/min   Anion gap 8 5 - 15   {Labs (Optional):23779}    Assessment & Plan:   Problem List Items Addressed This Visit    None    Assessment and Plan Assessment & Plan         Follow up plan: No follow-ups on file.

## 2024-03-27 ENCOUNTER — Ambulatory Visit: Payer: Self-pay | Admitting: Nurse Practitioner

## 2024-03-30 NOTE — Telephone Encounter (Signed)
 Following up on pt pap Novo Nordisk Ozempic per [employer] needs to specify qty on provider  portion of #4 have document it and change on application and will be faxing to [employer] today 03/30/24.

## 2024-04-03 ENCOUNTER — Telehealth (HOSPITAL_COMMUNITY): Payer: Self-pay

## 2024-04-03 DIAGNOSIS — I5032 Chronic diastolic (congestive) heart failure: Secondary | ICD-10-CM

## 2024-04-03 NOTE — Telephone Encounter (Signed)
 Called patient about her genetic results.   results were indeterminate.  Patient has been referred to Dr. Jomarie Longs for genetic counseling

## 2024-04-06 ENCOUNTER — Other Ambulatory Visit (HOSPITAL_COMMUNITY): Payer: Self-pay | Admitting: Cardiology

## 2024-04-07 ENCOUNTER — Other Ambulatory Visit: Payer: Self-pay | Admitting: Nurse Practitioner

## 2024-04-07 NOTE — Telephone Encounter (Signed)
 Copied from CRM [id number]. Topic: Clinical - Medication Refill >> Apr 07, 2024 12:33 PM Carlatta H wrote: Most Recent Primary Care Visit:  Provider: Quinton Buckler  Department: [facility]  Visit Type: OFFICE VISIT  Date: 03/13/2024  Medication: BELSOMRA 20 MG TABS [[id number]]  Has the patient contacted their pharmacy? No (Agent: If no, request that the patient contact the pharmacy for the refill. If patient does not wish to contact the pharmacy document the reason why and proceed with request.) (Agent: If yes, when and what did the pharmacy advise?)  Is this the correct pharmacy for this prescription? Yes If no, delete pharmacy and type the correct one.  This is the patient's preferred pharmacy:    [id number] - [place], [place] - [address] AT [facility] [address] [place] [address] Phone: [phone] Fax: [phone]   Has the prescription been filled recently? No  Is the patient out of the medication? Yes  Has the patient been seen for an appointment in the last year OR does the patient have an upcoming appointment? Yes  Can we respond through MyChart? Yes  Agent: Please be advised that Rx refills may take up to 3 business days. We ask that you follow-up with your pharmacy.

## 2024-04-08 ENCOUNTER — Telehealth: Payer: Self-pay

## 2024-04-08 ENCOUNTER — Other Ambulatory Visit: Payer: Self-pay | Admitting: Pharmacist

## 2024-04-08 ENCOUNTER — Other Ambulatory Visit (HOSPITAL_COMMUNITY): Payer: Self-pay

## 2024-04-08 DIAGNOSIS — E1165 Type 2 diabetes mellitus with hyperglycemia: Secondary | ICD-10-CM

## 2024-04-08 DIAGNOSIS — I2510 Atherosclerotic heart disease of native coronary artery without angina pectoris: Secondary | ICD-10-CM

## 2024-04-08 NOTE — Telephone Encounter (Signed)
 Requested medication (s) are due for refill today - expired Rx  Requested medication (s) are on the active medication list -yes  Future visit scheduled -no  Last refill: 01/31/22  Notes to clinic: historical medication - sent for review   Requested Prescriptions  Pending Prescriptions Disp Refills   BELSOMRA 20 MG TABS 30 tablet     Sig: Take 1 tablet (20 mg total) by mouth at bedtime as needed (sleep).     Off-Protocol Failed - 04/08/2024  2:07 PM      Failed - Medication not assigned to a protocol, review manually.      Passed - Valid encounter within last 12 months    Recent Outpatient Visits           3 weeks ago B12 deficiency   [facility] Quinton Buckler, FNP   1 month ago Dizzy   [facility] Donny Gall F, [place]                 Requested Prescriptions  Pending Prescriptions Disp Refills   BELSOMRA 20 MG TABS 30 tablet     Sig: Take 1 tablet (20 mg total) by mouth at bedtime as needed (sleep).     Off-Protocol Failed - 04/08/2024  2:07 PM      Failed - Medication not assigned to a protocol, review manually.      Passed - Valid encounter within last 12 months    Recent Outpatient Visits           3 weeks ago B12 deficiency   [facility] Quinton Buckler, FNP   1 month ago Dizzy   [facility], Monalisa Angles, [place]

## 2024-04-08 NOTE — Telephone Encounter (Signed)
 Pharmacy Patient Advocate Encounter  Insurance verification completed.   The patient is insured through [employer] claim for [employer]. Currently a quantity of 3ml is a 28 day supply and the co-pay is $12.15 .   This test claim was processed through [facility]- copay amounts may vary at other pharmacies due to pharmacy/plan contracts, or as the patient moves through the different stages of their insurance plan.

## 2024-04-08 NOTE — Telephone Encounter (Signed)
 Following up on pt PAP Novo Nordisk Ozempic gave [employer] a call to follow up on pt application last time when calling they said pt was missing qty #4 now they said application should only said qty #3 have fixed on application and faxed back to Nov[employer] Nordisk they also gave pt Id #[id number] when calling back we need to give them this Id #.

## 2024-04-08 NOTE — Progress Notes (Signed)
 04/08/2024 Name: Dawn Rice MRN: [id number] DOB: 08-12-1956  Chief Complaint  Patient presents with   Medication Assistance   Medication Management    Dawn Rice is a 68 y.o. year old female who presented for a telephone visit.   They were referred to the pharmacist by their PCP for assistance in managing medication access.    Conversation limited today as patient reports that she is not currently home   Subjective:   Care Team: Primary Care Provider: Quinton Buckler, FNP Cardiologist: Tobb, Kardie, DO Heart Failure Specialist: Darlis Eisenmenger, MD  Medication Access/Adherence  Current Pharmacy:  [facility] [phone] Jonette Nestle, [place] - [address] AT [facility] [address] & [facility] [address] [place] [address] Phone: [phone] Fax: [phone]  [id number] - [place] Nest[place], [place] - [address] AT [facility] [address] [place] [address] Phone: [phone] Fax: [phone]  [id number] - [place], [place] - [address] AT [facility] [address] [place] [address] Phone: [phone] Fax: [phone]   Patient reports affordability concerns with their medications: No Patient reports access/transportation concerns to their pharmacy: No  Patient reports adherence concerns with their medications:  No     Today patient shares that she received a response from Social Security regarding Extra Help application we submitted on 3/19 and she thinks that she was approved, but does not have letter to review during our call.   Diabetes:   Current medications:  - Synjardy 12.04-999 mg twice daily - Ozempic 0.25 mg weekly (started on 03/16/2024)  Reports tolerating well and plans to increase to Ozempic 0.5 mg weekly next Monday   Medications tried in the past: Rybelsus (cost)   Current glucose readings: morning fasting readings  ranging 80s-117   Current medication access support:  - Enrolled in patient assistance for Synjardy from [employer] through 12/23/2024   Objective:  Lab Results  Component Value Date   HGBA1C 6.8 (H) 03/02/2024    Lab Results  Component Value Date   CREATININE 1.02 (H) 03/17/2024   BUN 13 03/17/2024   NA 141 03/17/2024   K 4.1 03/17/2024   CL 111 03/17/2024   CO2 22 03/17/2024    Lab Results  Component Value Date   CHOL 154 06/25/2023   HDL 55 06/25/2023   LDLCALC 79 06/25/2023   TRIG 112 06/25/2023   CHOLHDL 2.8 06/25/2023    Current Outpatient Medications on File Prior to Visit  Medication Sig Dispense Refill   Semaglutide,0.25 or 0.5MG /DOS, (OZEMPIC, 0.25 OR 0.5 MG/DOSE,) 2 MG/3ML SOPN Inject 0.25 mg into the skin once a week. 3 mL 0   SYNJARDY 12.04-999 MG TABS Take 1 tablet by mouth 2 (two) times daily. 90 tablet 3   Aspirin-Caffeine (BC FAST PAIN RELIEF PO) Take 1 packet by mouth daily as needed (pain).     BELSOMRA 20 MG TABS Take 1 tablet by mouth at bedtime as needed (sleep).     carvedilol (COREG) 25 MG tablet Take 1 tablet (25 mg total) by mouth 2 (two) times daily with a meal. 180 tablet 3   glucose blood test strip 1 each by Other route as needed for other. Use as instructed     hydrocortisone 1 % ointment Apply 1 Application topically 2 (two) times daily. (Patient taking differently: Apply 1 Application topically 2 (two) times daily. As  needed) 30 g 0   Iron, Ferrous Sulfate, 325 (65 Fe) MG TABS Take 325 mg by mouth daily. 90 tablet 1   Ketotifen Fumarate (ALLERGY EYE DROPS OP) Place 1 drop into both eyes daily as needed (allergies).     rosuvastatin (CRESTOR) 20 MG tablet Take 1 tablet (20 mg total) by mouth daily. 90 tablet 3   spironolactone (ALDACTONE) 25 MG tablet Take 1 tablet (25 mg total) by mouth daily. 45 tablet 3   Vitamin D, Ergocalciferol, (DRISDOL) 1.25 MG (50000 UNIT) CAPS capsule Take 1 capsule (50,000 Units total) by mouth every 7 (seven)  days. 10 capsule 1   No current facility-administered medications on file prior to visit.       Assessment/Plan:   Collaborate with CPhT Ayla McCormick to request she run a test claim for Ozempic for patient. Per test claim, patient's copayment is now $12.15, consistent with [employer] Extra Help subsidy  Diabetes: - Reviewed goal A1c, goal fasting, and goal 2 hour post prandial glucose - Reviewed dietary modifications including importance of having regular well-balanced meals and snacks throughout the day, while controlling carbohydrate portion sizes - Will collaborate with PCP to ask provider to send prescription for Ozempic 0.5 mg weekly to local pharmacy for patient as cost now affordable through her prescription coverage with Extra Help subsidy - Will ask provider to send renewal of rosuvastatin prescription to pharmacy as current Rx is expired - Recommend to check glucose, keep log of results and have this record to review at upcoming medical appointments. Patient to contact provider office sooner if needed for readings outside of established parameters or symptoms   Follow Up Plan: Clinical Pharmacist will follow up with patient by telephone on 05/06/2024 at 1:30 PM  Arthur Lash, PharmD, [facility] [phone]

## 2024-04-13 ENCOUNTER — Other Ambulatory Visit: Payer: Self-pay | Admitting: Nurse Practitioner

## 2024-04-13 DIAGNOSIS — E785 Hyperlipidemia, unspecified: Secondary | ICD-10-CM

## 2024-04-13 DIAGNOSIS — E1165 Type 2 diabetes mellitus with hyperglycemia: Secondary | ICD-10-CM

## 2024-04-13 MED ORDER — OZEMPIC (0.25 OR 0.5 MG/DOSE) 2 MG/3ML ~~LOC~~ SOPN: 0.5000 mg | PEN_INJECTOR | SUBCUTANEOUS | 0 refills | Status: DC

## 2024-04-13 MED ORDER — ROSUVASTATIN CALCIUM 20 MG PO TABS: 20.0000 mg | ORAL_TABLET | Freq: Every day | ORAL | 3 refills | Status: DC

## 2024-04-13 NOTE — Patient Instructions (Signed)
 Goals Addressed             This Visit's Progress    Pharmacy Goals       The goal A1c is less than 7%. This is the best way to reduce the risk of the long term complications of diabetes, including heart disease, kidney disease, eye disease, strokes, and nerve damage. An A1c of less than 7% corresponds with fasting sugars less than 130 and 2 hour after meal sugars less than 180.   Estelle Grumbles, PharmD, [facility] [phone]

## 2024-04-15 ENCOUNTER — Other Ambulatory Visit: Admitting: Pharmacist

## 2024-05-06 ENCOUNTER — Other Ambulatory Visit: Payer: Self-pay | Admitting: Nurse Practitioner

## 2024-05-06 ENCOUNTER — Other Ambulatory Visit: Payer: Self-pay | Admitting: Pharmacist

## 2024-05-06 DIAGNOSIS — E1165 Type 2 diabetes mellitus with hyperglycemia: Secondary | ICD-10-CM

## 2024-05-06 MED ORDER — SYNJARDY XR 25-1000 MG PO TB24: 1.0000 | ORAL_TABLET | Freq: Every day | ORAL | 1 refills | Status: DC

## 2024-05-06 NOTE — Progress Notes (Signed)
 05/06/2024 Name: Dawn Rice MRN: [id number] DOB: Oct 02, 1956  Chief Complaint  Patient presents with   Medication Management    Dawn Rice is a 68 y.o. year old female who presented for a telephone visit.   They were referred to the pharmacist by their PCP for assistance in managing medication access.    Subjective:  Care Team: Primary Care Provider: Quinton Buckler, FNP Cardiologist: Tobb, Kardie, DO Heart Failure Specialist: Darlis Eisenmenger, MD; Next Scheduled Visit: 06/05/2024  Medication Access/Adherence  Current Pharmacy:  [facility][id number] [place], [place] - [address] AT [facility] OF [address] [place] [address] Phone: [phone] Fax: [phone]  [facility] #[id number] [place] Nest[place], [place] - [address] AT [facility] OF [address] & [facility] [address] [place] [address] Phone: [phone] Fax: [phone]  [id number] - [place], [place] - [address] AT [facility] OF [address] [place] [address] Phone: [phone] Fax: [phone]   Patient reports affordability concerns with their medications: No Patient reports access/transportation concerns to their pharmacy: No  Patient reports adherence concerns with their medications:  No     Reports medications now affordable since has [employer] subsidy   Diabetes:   Current medications:  - Synjardy  12.04-999 mg twice daily - Ozempic  0.5 mg weekly - Increased to current dose 3 weeks ago   Medications tried in the past: Rybelsus  (cost)  Reports has noticed increase appetite control with Ozempic  dose increase and change in fit of her clothing, but has not weighed herself at home   Current glucose readings: morning fasting readings primarily ranging 90-103   Reports recently had an episode on Saturday where felt weak with blood sugar reading of 50 after having skipped a meal  the night before  - Reports used peppermint candies to treat low and sugar went up into 70s/felt better within ~15 minutes  Statin therapy: rosuvastatin  20 mg daily  Current medication access support:  - Enrolled in patient assistance for Synjardy  from [employer] through 12/23/2024   Objective:  Lab Results  Component Value Date   HGBA1C 6.8 (H) 03/02/2024    Lab Results  Component Value Date   CREATININE 1.02 (H) 03/17/2024   BUN 13 03/17/2024   NA 141 03/17/2024   K 4.1 03/17/2024   CL 111 03/17/2024   CO2 22 03/17/2024    Lab Results  Component Value Date   CHOL 154 06/25/2023   HDL 55 06/25/2023   LDLCALC 79 06/25/2023   TRIG 112 06/25/2023   CHOLHDL 2.8 06/25/2023     Medications Reviewed Today     Reviewed by Ardis Becton, RPH-CPP (Pharmacist) on 05/06/24 at 1344  Med List Status: <None>   Medication Order Taking? Sig Documenting Provider Last Dose Status Informant  Aspirin -Caffeine (BC FAST PAIN RELIEF PO) [id number]  Take 1 packet by mouth daily as needed (pain). [provider]  Active Self  BELSOMRA 20 MG TABS [id number]  Take 1 tablet by mouth at bedtime as needed (sleep). [provider]  Active Self  carvedilol  (COREG ) 25 MG tablet [id number]  Take 1 tablet (25 mg total) by mouth 2 (two) times daily with a meal. [place], Arlice Bene, FNP  Active   glucose blood test strip [id number]  1 each by Other route as needed for other. Use as instructed [provider]  Active  hydrocortisone  1 % ointment [id number]  Apply 1 Application topically 2 (two) times daily.  Patient taking differently: Apply 1 Application topically 2 (two) times daily. As needed   Rockney Cid, DO  Active   Iron , Ferrous Sulfate , 325 (65 Fe) MG TABS [id number]  Take 325 mg by mouth daily. Pender, Julie F, FNP  Active   Ketotifen Fumarate (ALLERGY  EYE DROPS OP) [id number]  Place 1 drop into both eyes daily as needed (allergies). [provider]   Active Self  rosuvastatin  (CRESTOR ) 20 MG tablet [id number]  Take 1 tablet (20 mg total) by mouth daily. Quinton Buckler, FNP  Active   Semaglutide ,0.25 or 0.5MG /DOS, (OZEMPIC , 0.25 OR 0.5 MG/DOSE,) 2 MG/3ML SOPN [id number] Yes Inject 0.5 mg into the skin once a week. Quinton Buckler, FNP Taking Active   spironolactone  (ALDACTONE ) 25 MG tablet [id number]  Take 1 tablet (25 mg total) by mouth daily. Vernia Good [place], [place]  Active   SYNJARDY  12.04-999 MG TABS [id number] Yes Take 1 tablet by mouth 2 (two) times daily. Quinton Buckler, FNP Taking Active   Vitamin D , Ergocalciferol , (DRISDOL ) 1.25 MG (50000 UNIT) CAPS capsule [id number]  Take 1 capsule (50,000 Units total) by mouth every 7 (seven) days. Quinton Buckler, FNP  Active               Assessment/Plan:   Patient to contact [employer] patient assistance program to cancel future shipments of Synjardy  from program as no longer eligible (now has [employer] subsidy)  Diabetes: - Reviewed goal A1c, goal fasting, and goal 2 hour post prandial glucose - Reviewed dietary modifications including importance of having regular well-balanced meals and snacks throughout the day, while controlling carbohydrate portion sizes  Advise patient against skipping meals - Collaborate with PCP regarding diabetes medication management for patient to recommend reducing patient's Synjardy  dose to reduce risk of future hypoglycemia  Provider agreed. Prescription for Synjardy  XR 25-1000 mg daily sent to pharmacy - Recommend to check glucose, keep log of results and have this record to review at upcoming medical appointments. Patient to contact provider office sooner if needed for readings outside of established parameters or symptoms   Follow Up Plan: Clinical Pharmacist will follow up with patient by telephone on 06/10/2024 at 3:00 PM    Arthur Lash, PharmD, [facility] [phone]

## 2024-05-06 NOTE — Patient Instructions (Signed)
 Goals Addressed             This Visit's Progress    Pharmacy Goals       The goal A1c is less than 7%. This is the best way to reduce the risk of the long term complications of diabetes, including heart disease, kidney disease, eye disease, strokes, and nerve damage. An A1c of less than 7% corresponds with fasting sugars less than 130 and 2 hour after meal sugars less than 180.   Estelle Grumbles, PharmD, [facility] [phone]

## 2024-05-20 DIAGNOSIS — Z961 Presence of intraocular lens: Secondary | ICD-10-CM | POA: Diagnosis not present

## 2024-05-20 DIAGNOSIS — E119 Type 2 diabetes mellitus without complications: Secondary | ICD-10-CM | POA: Diagnosis not present

## 2024-05-20 DIAGNOSIS — H35413 Lattice degeneration of retina, bilateral: Secondary | ICD-10-CM | POA: Diagnosis not present

## 2024-05-20 DIAGNOSIS — H04123 Dry eye syndrome of bilateral lacrimal glands: Secondary | ICD-10-CM | POA: Diagnosis not present

## 2024-05-20 DIAGNOSIS — H1045 Other chronic allergic conjunctivitis: Secondary | ICD-10-CM | POA: Diagnosis not present

## 2024-05-20 LAB — HM DIABETES EYE EXAM

## 2024-05-27 ENCOUNTER — Other Ambulatory Visit (HOSPITAL_COMMUNITY): Payer: Self-pay

## 2024-05-27 ENCOUNTER — Telehealth: Payer: Self-pay | Admitting: Pharmacy Technician

## 2024-05-27 NOTE — Telephone Encounter (Signed)
 Pharmacy Patient Advocate Encounter   Received notification from Onbase that prior authorization for Ozempic  (0.25 or 0.5 MG/DOSE) 2MG /3ML pen-injectors is required/requested.   Insurance verification completed.   The patient is insured through [facility] .   Per test claim: The current 28 day co-pay is, $12.15.  No PA needed at this time. This test claim was processed through [facility]- copay amounts may vary at other pharmacies due to pharmacy/plan contracts, or as the patient moves through the different stages of their insurance plan.

## 2024-06-01 ENCOUNTER — Encounter: Payer: Self-pay | Admitting: Nurse Practitioner

## 2024-06-01 ENCOUNTER — Ambulatory Visit: Admitting: Nurse Practitioner

## 2024-06-01 VITALS — BP 136/84 | HR 79 | Temp 97.8°F | Ht 62.0 in | Wt 187.5 lb

## 2024-06-01 DIAGNOSIS — B370 Candidal stomatitis: Secondary | ICD-10-CM | POA: Diagnosis not present

## 2024-06-01 MED ORDER — NYSTATIN 100000 UNIT/ML MT SUSP: 500000.0000 [IU] | Freq: Four times a day (QID) | OROMUCOSAL | 0 refills | Status: DC

## 2024-06-01 NOTE — Progress Notes (Signed)
 BP 136/84 (BP Location: Left Arm, Patient Position: Sitting, Cuff Size: Normal)   Pulse 79   Temp 97.8 F (36.6 C) (Oral)   Ht 5\' 2"  (1.575 m)   Wt 187 lb 8 oz (85 kg)   SpO2 95%   BMI 34.29 kg/m    Subjective:    Patient ID: Dawn Rice, female    DOB: 03-27-1956, 68 y.o.   MRN: [id number]  HPI: Dawn Rice is a 68 y.o. female  Chief Complaint  Patient presents with   Thrush    Swollen, hurts when she eats, onset 3d    Discussed the use of AI scribe software for clinical note transcription with the patient, who gave verbal consent to proceed.  History of Present Illness [name] is a 68 year old female who presents with possible thrush on her tongue.  Symptoms began three days ago with a sore throat and the development of a white coating on her tongue. Her tongue is now swollen, red, and painful, especially when eating, and she describes a burning sensation. She also notes that her taste buds have been affected, stating 'it took my taste buds away.'  She is currently taking Synjardy  and Ozempic , with her blood sugars reported as fine. She takes Ozempic  at a dose of 0.25 mg.   No current sore throat, but history of sore throat noted. Symptoms are now localized to her tongue.         11/27/2023    1:49 PM 08/22/2023   10:23 AM 07/25/2023    2:02 PM  Depression screen PHQ 2/9  Decreased Interest 0 0 0  Down, Depressed, Hopeless 0 0 0  PHQ - 2 Score 0 0 0  Altered sleeping 0    Tired, decreased energy 0    Change in appetite 0    Feeling bad or failure about yourself  0    Trouble concentrating 0    Moving slowly or fidgety/restless 0    Suicidal thoughts 0    PHQ-9 Score 0      Relevant past medical, surgical, family and social history reviewed and updated as indicated. Interim medical history since our last visit reviewed. Allergies and medications reviewed and updated.  Review of Systems  Ten systems reviewed and is negative except as  mentioned in HPI      Objective:      BP 136/84 (BP Location: Left Arm, Patient Position: Sitting, Cuff Size: Normal)   Pulse 79   Temp 97.8 F (36.6 C) (Oral)   Ht 5\' 2"  (1.575 m)   Wt 187 lb 8 oz (85 kg)   SpO2 95%   BMI 34.29 kg/m    Wt Readings from Last 3 Encounters:  06/01/24 187 lb 8 oz (85 kg)  03/17/24 192 lb (87.1 kg)  03/13/24 192 lb 9.6 oz (87.4 kg)    Physical Exam Vitals reviewed.  Constitutional:      Appearance: Normal appearance.  HENT:     Head: Normocephalic.     Mouth/Throat:     Comments: thrush Cardiovascular:     Rate and Rhythm: Normal rate.  Pulmonary:     Effort: Pulmonary effort is normal.  Neurological:     General: No focal deficit present.     Mental Status: She is alert and oriented to person, place, and time. Mental status is at baseline.  Psychiatric:        Mood and Affect: Mood normal.  Behavior: Behavior normal.        Thought Content: Thought content normal.        Judgment: Judgment normal.       Results for orders placed or performed in visit on 05/20/24  HM DIABETES EYE EXAM   Collection Time: 05/20/24 11:39 AM  Result Value Ref Range   HM Diabetic Eye Exam No Retinopathy No Retinopathy          Assessment & Plan:   Problem List Items Addressed This Visit   None Visit Diagnoses       Thrush, oral    -  Primary   Relevant Medications   nystatin (MYCOSTATIN) 100000 UNIT/ML suspension        Assessment and Plan Assessment & Plan Oral Candidiasis (Thrush) Oral candidiasis suspected due to swollen, painful tongue with white patches for three days, likely related to Synjardy  use, which predisposes to fungal infections. Symptoms include burning sensation and loss of taste. No current sore throat. Informed consent provided for nystatin use, with instructions to swish and spit, and to swallow if sore throat develops to ensure treatment reaches affected areas. - Prescribe nystatin oral suspension for swish  and spit, four times a day. - Advise to swallow nystatin if sore throat develops. - Hold Synjardy  temporarily and monitor blood glucose levels with Ozempic  alone. - Send a message to update on condition progress.  Type 2 Diabetes Mellitus Type 2 diabetes managed with Synjardy  and Ozempic . Blood glucose levels reported as well-controlled. Temporary discontinuation of Synjardy  advised to assess impact on oral candidiasis. Monitor blood glucose levels while holding Synjardy  to evaluate the effect of Ozempic  alone on glycemic control. - Monitor blood glucose levels while holding Synjardy . - Continue Ozempic  at current dose of 0.25 mg.        Follow up plan: Return if symptoms worsen or fail to improve.

## 2024-06-05 ENCOUNTER — Encounter (HOSPITAL_COMMUNITY): Admitting: Cardiology

## 2024-06-10 ENCOUNTER — Other Ambulatory Visit: Payer: Self-pay | Admitting: Pharmacist

## 2024-06-10 DIAGNOSIS — E1165 Type 2 diabetes mellitus with hyperglycemia: Secondary | ICD-10-CM

## 2024-06-10 NOTE — Progress Notes (Signed)
 06/10/2024 Name: Dawn Rice MRN: [id number] DOB: 01-28-1956  Chief Complaint  Patient presents with   Medication Management    Dawn Rice is a 68 y.o. year old female who presented for a telephone visit.   They were referred to the pharmacist by their PCP for assistance in managing medication access.      Subjective:   Care Team: Primary Care Provider: Quinton Buckler, FNP Cardiologist: Tobb, Kardie, DO Heart Failure Specialist: Darlis Eisenmenger, MD; Next Scheduled Visit: 07/01/2024    Medication Access/Adherence  Current Pharmacy:  [facility] #[id number] [place] Nest[place], [place] - [address] AT [facility] OF [address] [place] [address] Phone: [phone] Fax: [phone]  [facility] #[id number] [place] Nest[place], [place] - [address] AT [facility] [address] & [facility] [address] [place] [address] Phone: [phone] Fax: [phone]  [id number] [place], [place] - [address] AT [facility] [address] [place] [address] Phone: [phone] Fax: [phone]   Patient reports affordability concerns with their medications: No Patient reports access/transportation concerns to their pharmacy: No  Patient reports adherence concerns with their medications:  No     Reports taking course of Nystatin  as prescribed PCP (will complete by tomorrow) and symptoms have improved    Diabetes:   Current medications:  - Ozempic  0.5 mg weekly   Medications tried in the past: Rybelsus  (cost), Synjardy    Patient has noticed increase appetite control with Ozempic  dose increase and change in fit of her clothing, but has not weighed herself at home   Current glucose readings: morning fasting readings primarily ranging 80s-122   Denies recent of hypoglycemia    Statin therapy: rosuvastatin  20 mg daily     Objective:  Lab Results  Component Value Date    HGBA1C 6.8 (H) 03/02/2024    Lab Results  Component Value Date   CREATININE 1.02 (H) 03/17/2024   BUN 13 03/17/2024   NA 141 03/17/2024   K 4.1 03/17/2024   CL 111 03/17/2024   CO2 22 03/17/2024    Lab Results  Component Value Date   CHOL 154 06/25/2023   HDL 55 06/25/2023   LDLCALC 79 06/25/2023   TRIG 112 06/25/2023   CHOLHDL 2.8 06/25/2023    Medications Reviewed Today     Reviewed by Ardis Becton, RPH-CPP (Pharmacist) on 06/10/24 at 1520  Med List Status: <None>   Medication Order Taking? Sig Documenting Provider Last Dose Status Informant  Aspirin -Caffeine (BC FAST PAIN RELIEF PO) [id number]  Take 1 packet by mouth daily as needed (pain).  Patient not taking: Reported on 06/01/2024   [provider]  Active Self  BELSOMRA 20 MG TABS [id number]  Take 1 tablet by mouth at bedtime as needed (sleep).  Patient not taking: Reported on 06/01/2024   [provider]  Active Self  carvedilol  (COREG ) 25 MG tablet [id number] Yes Take 1 tablet (25 mg total) by mouth 2 (two) times daily with a meal. [place], Arlice Bene, [place]  Active   Empagliflozin-metFORMIN  HCl ER (SYNJARDY  XR) 25-1000 MG TB24 [id number]  Take 1 tablet by mouth daily.  Patient not taking: Reported on 06/10/2024   Quinton Buckler, FNP  Active   glucose blood test strip [id number]  1 each by Other route as needed for other. Use as instructed [provider]  Active   hydrocortisone  1 %  ointment [id number]  Apply 1 Application topically 2 (two) times daily.  Patient taking differently: Apply 1 Application topically 2 (two) times daily. As needed   Rockney Cid, DO  Active   Iron , Ferrous Sulfate , 325 (65 Fe) MG TABS [id number]  Take 325 mg by mouth daily. Pender, Julie F, FNP  Active   Ketotifen Fumarate (ALLERGY  EYE DROPS OP) [id number]  Place 1 drop into both eyes daily as needed (allergies). [provider]  Active Self  nystatin  (MYCOSTATIN ) 100000 UNIT/ML suspension [id number]   Take 5 mLs (500,000 Units total) by mouth 4 (four) times daily. Swish for 30 seconds and spit out. Quinton Buckler, FNP  Active   rosuvastatin  (CRESTOR ) 20 MG tablet [id number]  Take 1 tablet (20 mg total) by mouth daily. Quinton Buckler, FNP  Active   Semaglutide ,0.25 or 0.5MG /DOS, (OZEMPIC , 0.25 OR 0.5 MG/DOSE,) 2 MG/3ML SOPN [id number] Yes Inject 0.5 mg into the skin once a week. Quinton Buckler, FNP  Active   spironolactone  (ALDACTONE ) 25 MG tablet [id number] Yes Take 1 tablet (25 mg total) by mouth daily.  Patient taking differently: Take 12.5 mg by mouth daily.   [place], [place], FNP  Active   Vitamin D , Ergocalciferol , (DRISDOL ) 1.25 MG (50000 UNIT) CAPS capsule [id number]  Take 1 capsule (50,000 Units total) by mouth every 7 (seven) days.  Patient not taking: Reported on 06/01/2024   Quinton Buckler, FNP  Active               Assessment/Plan:   Diabetes: - Reviewed goal A1c, goal fasting, and goal 2 hour post prandial glucose - Reviewed dietary modifications including importance of having regular well-balanced meals and snacks throughout the day, while controlling carbohydrate portion sizes - Recommend to check glucose, keep log of results and have this record to review at upcoming medical appointments. Patient to contact provider office sooner if needed for readings outside of established parameters or symptoms   Follow Up Plan: Clinical Pharmacist will follow up with patient by telephone on 07/08/2024 at 3:00 PM     Arthur Lash, PharmD, [facility] [phone]

## 2024-06-10 NOTE — Patient Instructions (Signed)
 Goals Addressed             This Visit's Progress    Pharmacy Goals       The goal A1c is less than 7%. This is the best way to reduce the risk of the long term complications of diabetes, including heart disease, kidney disease, eye disease, strokes, and nerve damage. An A1c of less than 7% corresponds with fasting sugars less than 130 and 2 hour after meal sugars less than 180.   Estelle Grumbles, PharmD, [facility] [phone]

## 2024-06-11 DIAGNOSIS — R200 Anesthesia of skin: Secondary | ICD-10-CM | POA: Diagnosis not present

## 2024-06-11 DIAGNOSIS — M654 Radial styloid tenosynovitis [de Quervain]: Secondary | ICD-10-CM | POA: Diagnosis not present

## 2024-06-11 DIAGNOSIS — R202 Paresthesia of skin: Secondary | ICD-10-CM | POA: Diagnosis not present

## 2024-06-29 ENCOUNTER — Ambulatory Visit: Payer: Self-pay

## 2024-06-29 NOTE — Telephone Encounter (Signed)
 Patient scheduled for 7/8 with PCP  **E2C2 RN template not populating**                             Copied from CRM [id number]. Topic: Clinical - Red Word Triage >> Jun 29, 2024  1:51 PM Montie POUR wrote: Red Word that prompted transfer to Nurse Triage:  She has a rash on her left arm. It is red, itching, swollen. No pain, just itching really bad. Reason for Disposition  Localized rash present > 7 days  Answer Assessment - Initial Assessment Questions 1. APPEARANCE of RASH: Describe the rash.       Look like measles or poison oak  2. LOCATION: Where is the rash located?      Left Arm  3. NUMBER: How many spots are there?      Several   4. SIZE: How big are the spots? (Inches, centimeters or compare to size of a coin)      So many to count   5. ONSET: When did the rash start?      X 2 weeks   6. ITCHING: Does the rash itch? If Yes, ask: How bad is the itch?  (Scale 0-10; or none, mild, moderate, severe)      Moderate to severe  7. PAIN: Does the rash hurt? If Yes, ask: How bad is the pain?  (Scale 0-10; or none, mild, moderate, severe)    - NONE (0): no pain    - MILD (1-3): doesn't interfere with normal activities     - MODERATE (4-7): interferes with normal activities or awakens from sleep     - SEVERE (8-10): excruciating pain, unable to do any normal activities      No   8. OTHER SYMPTOMS: Do you have any other symptoms? (e.g., fever)     Swelling   OTC anti-itch cream  Protocols used: Rash or Redness - Localized-A-AH

## 2024-06-30 ENCOUNTER — Telehealth (HOSPITAL_COMMUNITY): Payer: Self-pay | Admitting: Cardiology

## 2024-06-30 ENCOUNTER — Ambulatory Visit: Admitting: Nurse Practitioner

## 2024-06-30 ENCOUNTER — Ambulatory Visit: Admitting: Genetic Counselor

## 2024-06-30 NOTE — Telephone Encounter (Signed)
 Called to confirm/remind patient of their appointment at [facility] on 06/30/24.   Appointment:   [x] Confirmed  [] Left mess   [] No answer/No voice mail  [] VM Full/unable to leave message  [] Phone not in service  Patient reminded to bring all medications and/or complete list.  Confirmed patient has transportation. Gave directions, instructed to utilize valet parking.

## 2024-07-01 ENCOUNTER — Other Ambulatory Visit: Payer: Self-pay | Admitting: Nurse Practitioner

## 2024-07-01 ENCOUNTER — Ambulatory Visit (INDEPENDENT_AMBULATORY_CARE_PROVIDER_SITE_OTHER): Admitting: Nurse Practitioner

## 2024-07-01 ENCOUNTER — Encounter: Payer: Self-pay | Admitting: Nurse Practitioner

## 2024-07-01 ENCOUNTER — Ambulatory Visit (HOSPITAL_COMMUNITY)
Admission: RE | Admit: 2024-07-01 | Discharge: 2024-07-01 | Disposition: A | Discharge status: Home / Self Care | Source: Ambulatory Visit | Attending: Cardiology | Admitting: Cardiology

## 2024-07-01 ENCOUNTER — Telehealth (HOSPITAL_COMMUNITY): Payer: Self-pay

## 2024-07-01 ENCOUNTER — Other Ambulatory Visit (HOSPITAL_COMMUNITY): Payer: Self-pay

## 2024-07-01 ENCOUNTER — Ambulatory Visit: Admitting: Nurse Practitioner

## 2024-07-01 ENCOUNTER — Ambulatory Visit (HOSPITAL_COMMUNITY): Payer: Self-pay | Admitting: Cardiology

## 2024-07-01 VITALS — BP 136/84 | HR 97 | Temp 97.2°F | Resp 18 | Ht 62.0 in | Wt 186.6 lb

## 2024-07-01 VITALS — BP 140/80 | HR 84 | Wt 187.8 lb

## 2024-07-01 DIAGNOSIS — E785 Hyperlipidemia, unspecified: Secondary | ICD-10-CM | POA: Insufficient documentation

## 2024-07-01 DIAGNOSIS — R21 Rash and other nonspecific skin eruption: Secondary | ICD-10-CM | POA: Diagnosis not present

## 2024-07-01 DIAGNOSIS — E669 Obesity, unspecified: Secondary | ICD-10-CM | POA: Diagnosis not present

## 2024-07-01 DIAGNOSIS — Z7984 Long term (current) use of oral hypoglycemic drugs: Secondary | ICD-10-CM | POA: Diagnosis not present

## 2024-07-01 DIAGNOSIS — G4733 Obstructive sleep apnea (adult) (pediatric): Secondary | ICD-10-CM | POA: Insufficient documentation

## 2024-07-01 DIAGNOSIS — Z6834 Body mass index (BMI) 34.0-34.9, adult: Secondary | ICD-10-CM | POA: Diagnosis not present

## 2024-07-01 DIAGNOSIS — I5032 Chronic diastolic (congestive) heart failure: Secondary | ICD-10-CM | POA: Diagnosis not present

## 2024-07-01 DIAGNOSIS — J302 Other seasonal allergic rhinitis: Secondary | ICD-10-CM

## 2024-07-01 DIAGNOSIS — E119 Type 2 diabetes mellitus without complications: Secondary | ICD-10-CM | POA: Insufficient documentation

## 2024-07-01 DIAGNOSIS — I272 Pulmonary hypertension, unspecified: Secondary | ICD-10-CM | POA: Insufficient documentation

## 2024-07-01 DIAGNOSIS — I2510 Atherosclerotic heart disease of native coronary artery without angina pectoris: Secondary | ICD-10-CM | POA: Insufficient documentation

## 2024-07-01 DIAGNOSIS — I110 Hypertensive heart disease with heart failure: Secondary | ICD-10-CM | POA: Diagnosis not present

## 2024-07-01 DIAGNOSIS — Z79899 Other long term (current) drug therapy: Secondary | ICD-10-CM | POA: Insufficient documentation

## 2024-07-01 DIAGNOSIS — Z7985 Long-term (current) use of injectable non-insulin antidiabetic drugs: Secondary | ICD-10-CM | POA: Insufficient documentation

## 2024-07-01 DIAGNOSIS — R011 Cardiac murmur, unspecified: Secondary | ICD-10-CM | POA: Insufficient documentation

## 2024-07-01 DIAGNOSIS — D509 Iron deficiency anemia, unspecified: Secondary | ICD-10-CM | POA: Insufficient documentation

## 2024-07-01 LAB — LIPID PANEL
Cholesterol: 147 mg/dL (ref 0–200)
HDL: 48 mg/dL (ref >40)
LDL Cholesterol: 74 mg/dL (ref 0–99)
Total CHOL/HDL Ratio: 3.1 ratio
Triglycerides: 123 mg/dL (ref <150)
VLDL: 25 mg/dL (ref 0–40)

## 2024-07-01 LAB — BASIC METABOLIC PANEL WITH GFR
Anion gap: 12 (ref 5–15)
BUN: 16 mg/dL (ref 8–23)
CO2: 26 mmol/L (ref 22–32)
Calcium: 9.6 mg/dL (ref 8.9–10.3)
Chloride: 104 mmol/L (ref 98–111)
Creatinine, Ser: 0.94 mg/dL (ref 0.44–1.00)
GFR, Estimated: >60 mL/min (ref >60)
Glucose, Bld: 136 mg/dL — ABNORMAL HIGH (ref 70–99)
Potassium: 4.4 mmol/L (ref 3.5–5.1)
Sodium: 142 mmol/L (ref 135–145)

## 2024-07-01 LAB — BRAIN NATRIURETIC PEPTIDE: B Natriuretic Peptide: 11.8 pg/mL (ref 0.0–100.0)

## 2024-07-01 MED ORDER — CETIRIZINE HCL 10 MG PO TABS: 10.0000 mg | ORAL_TABLET | Freq: Every day | ORAL | 0 refills | Status: AC

## 2024-07-01 MED ORDER — EMPAGLIFLOZIN 10 MG PO TABS: 10.0000 mg | ORAL_TABLET | Freq: Every day | ORAL | 3 refills | Status: AC

## 2024-07-01 MED ORDER — FAMOTIDINE 20 MG PO TABS: 20.0000 mg | ORAL_TABLET | Freq: Two times a day (BID) | ORAL | 0 refills | Status: AC

## 2024-07-01 MED ORDER — TRIAMCINOLONE ACETONIDE 0.1 % EX CREA: 1.0000 | TOPICAL_CREAM | Freq: Two times a day (BID) | CUTANEOUS | 0 refills | Status: DC

## 2024-07-01 MED ORDER — SPIRONOLACTONE 25 MG PO TABS: 25.0000 mg | ORAL_TABLET | Freq: Every day | ORAL | 3 refills | Status: AC

## 2024-07-01 NOTE — Progress Notes (Signed)
 BP 136/84   Pulse 97   Temp (!) 97.2 F (36.2 C)   Resp 18   Ht 5' 2 (1.575 m)   Wt 186 lb 9.6 oz (84.6 kg)   SpO2 97%   BMI 34.13 kg/m    Subjective:    Patient ID: Dawn Rice, female    DOB: [birth year], 68 y.o.   MRN: [id number]  HPI: Dawn Rice is a 68 y.o. female presenting today with a  rash on her left arm near elbow. Reports small red/white bumps.   -Onset: 2 weeks ago -Endorses itchiness  -Reports mild swelling  -Denies new soaps/lotions/detergents  -Reports using OTC anti-itch cream  -Reports hx of eczema and allergic rhinitis  -patient reports worsening allergic rhinitis   HTN: patient reports her blood pressure has been running high lately.  Today it was 162/92, recheck was 136/84.  Patient reports she has been taking her blood pressure medication.  Currently on carvedilol  25 mg BID and spirolactone 12.5 mg daily. She reports she does have an appointment with cardiology this afternoon.  Will not make changes at this time.       11/27/2023    1:49 PM 08/22/2023   10:23 AM 07/25/2023    2:02 PM  Depression screen PHQ 2/9  Decreased Interest 0 0 0  Down, Depressed, Hopeless 0 0 0  PHQ - 2 Score 0 0 0  Altered sleeping 0    Tired, decreased energy 0    Change in appetite 0    Feeling bad or failure about yourself  0    Trouble concentrating 0    Moving slowly or fidgety/restless 0    Suicidal thoughts 0    PHQ-9 Score 0      Relevant past medical, surgical, family and social history reviewed and updated as indicated. Interim medical history since our last visit reviewed. Allergies and medications reviewed and updated.  Review of Systems  Constitutional: Negative for fever or weight change.  Respiratory: Negative for cough and shortness of breath.   Cardiovascular: Negative for chest pain or palpitations.  Gastrointestinal: Negative for abdominal pain, no bowel changes.  Musculoskeletal: Negative for gait problem or joint swelling.  Skin:  Positive for red rash to left elbow  Neurological: Negative for dizziness or headache.  No other specific complaints in a complete review of systems (except as listed in HPI above).      Objective:     BP 136/84   Pulse 97   Temp (!) 97.2 F (36.2 C)   Resp 18   Ht 5' 2 (1.575 m)   Wt 186 lb 9.6 oz (84.6 kg)   SpO2 97%   BMI 34.13 kg/m    Wt Readings from Last 3 Encounters:  07/01/24 186 lb 9.6 oz (84.6 kg)  06/01/24 187 lb 8 oz (85 kg)  03/17/24 192 lb (87.1 kg)    Physical Exam Constitutional:      Appearance: Normal appearance.  HENT:     Head: Normocephalic and atraumatic.  Cardiovascular:     Rate and Rhythm: Normal rate and regular rhythm.     Pulses: Normal pulses.     Heart sounds: Normal heart sounds.  Pulmonary:     Effort: Pulmonary effort is normal.     Breath sounds: Normal breath sounds.  Skin:    Findings: Erythema and rash present.     Comments: Left elbow and forearm   Neurological:     General: No focal deficit present.  Mental Status: She is alert and oriented to person, place, and time.  Psychiatric:        Mood and Affect: Mood normal.        Behavior: Behavior normal.        Thought Content: Thought content normal.        Judgment: Judgment normal.      Results for orders placed or performed in visit on 05/20/24  HM DIABETES EYE EXAM   Collection Time: 05/20/24 11:39 AM  Result Value Ref Range   HM Diabetic Eye Exam No Retinopathy No Retinopathy          Assessment & Plan:   Problem List Items Addressed This Visit   None Visit Diagnoses       Seasonal allergic rhinitis, unspecified trigger    -  Primary   Start taking zyrtec  and pepcid . Avoid allergen triggers.     Rash       Start taking zyrtec  and pepcid . Avoid allergen triggers. apply kenalog  cream 2 times a day,  if no improvement let me know   Relevant Medications   cetirizine  (ZYRTEC ) 10 MG tablet   triamcinolone  cream (KENALOG ) 0.1 %   famotidine  (PEPCID ) 20 MG  tablet               Follow up plan: Return if symptoms worsen or fail to improve.    I have reviewed this encounter including the documentation in this note and/or discussed this patient with the provider, Aislinn Womack, SNP, I am certifying that I agree with the content of this note as supervising/preceptor nurse practitioner.  Mliss Spray, FNP-C [facility] 07/01/2024, 11:46 AM

## 2024-07-01 NOTE — Patient Instructions (Signed)
 Zyrtec  or Claritin  daily  Pepcid  two times a day  Benadryl  at night

## 2024-07-01 NOTE — Telephone Encounter (Signed)
 [facility] Patient Advocate Encounter  Test billing for this patients current coverage returns a copay of $12.15 for 30 or 90 days of Jardiance . Advised to fill 3 month supply when able, assistance not needed for this medication at this time.  Rachel DEL, CPhT Rx Patient Advocate Phone: [phone]

## 2024-07-01 NOTE — Progress Notes (Signed)
 ADVANCED [facility] FAILURE CLINIC NOTE  PCP: Gareth Mliss FALCON, FNP Cardiology: Dr. Sheena HF Cardiology: Dr. Rolan  CC: HF follow up  HPI: 68 y.o. with history of HTN, hyperlipidemia, and diabetes was referred by Dr. Sheena for evaluation of diastolic CHF/pulmonary hypertension.  Patient has had some exertional dyspnea for a year.  4/23 echo showed EF 70-75%, severe LVH with some asymmetry of the basal septum, LV mid cavitary gradient peak 29 at rest and 51 with Valsalva, no SAM, normal RV.  Coronary CTA in 4/23 showed a moderate proximal LCx stenosis that did not appear hemodynamically significant by FFR. Her beta blocker was titrated up with LV mid-cavity gradient.  Stress echo was done in 12/23, showing that LV mid cavity gradient increased from 12 peak to 26 peak with exercise, PASP increased from 30 to 48 with exercise.  No wall motion abnormalities with exercise.   Also of note, patient had Fe deficiency anemia.  She was seen by GI and found to be guaiac negative. No BRBPR/melena. Given IV iron  after initial appointment.   Patient had RHC/LHC in 4/24 showing nonobstructive CAD, mildly elevated RA pressure and normal PCWP.   Cardiac MRI in 7/24 showed EF 65%, moderate asymmetric septal hypertrophy with no mitral valve systolic anterior motion, normal RV EF 53%, no delayed enhancement.   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   Today she returns for HF follow up. Weight is down 5 lbs.  SBP running high 140s-150s.  No significant exertional dyspnea.  No chest pain.  No orthopnea/PND.  No lightheadedness.  Overall, seems to be doing well.   ECG (personally reviewed): NSR, left axis deviation, septal Qs  Labs (11/23): hgb 7.6, K 4.6, creatinine 0.8 Labs (3/24): BNP 18.5, LDL 69, ferritin 7, transferrin saturation 8%, TSH normal, hgb 9.3, K 4.3, creatinine 0.92 Labs (7/24): LDL 79, K 4.7, creatinine 9.15 Labs (3/25): K 4.6, Scr 0.83 =>1.02, A1C 6.8, TSH 0.84, hgb  11.6  PMH: 1. HTN 2. Type 2 diabetes 3. Hyperlipidemia 4. Epilepsy with h/o seizures. 5. Fe deficiency anemia 6. Remote tobacco use (quit 2009).  7. Chronic diastolic CHF:  ?Variant of hypertrophic cardiomyopathy.  Echo (4/23) with EF 70-75%, severe LVH with some asymmetry of the basal septum, LV mid cavitary gradient peak 29 at rest and 51 with Valsalva, no SAM, normal RV.  - Stress echo (12/23): LV mid cavity gradient increased from 12 peak to 26 peak with exercise, PASP increased from 30 to 48 with exercise.  No wall motion abnormalities with exercise.  - RHC (4/24): mean RA 9, PA 31/11 mean 22, mean PCWP 13, CI 3.39 - Cardiac MRI (7/24): EF 65%, moderate asymmetric septal hypertrophy with no mitral valve systolic anterior motion, normal RV EF 53%, no delayed enhancement. - TTN gene mutation of uncertain significance.  8. CAD: Coronary CTA in 4/23 showed 50-69% proximal LCx stenosis, FFR negative.  - Stress echo in 12/23 as above was negative for ischemia.  - LHC (4/24): Mild nonobstructive CAD.  9. OSA: Sleep study in 9/24 with OSA.   Social History   Socioeconomic History   Marital status: Widowed    Spouse name: Not on file   Number of children: 4   Years of education: Not on file   Highest education level: Not on file  Occupational History   Not on file  Tobacco Use   Smoking status: Former    Current packs/day: 0.00    Average packs/day: 0.5 packs/day for  20.0 years (10.0 ttl pk-yrs)    Types: Cigarettes    Start date: 4    Quit date: 2009    Years since quitting: 16.5   Smokeless tobacco: Never  Vaping Use   Vaping status: Never Used  Substance and Sexual Activity   Alcohol use: Yes    Comment: 07/22/2017 glass of wine maybe once/month   Drug use: No   Sexual activity: Not Currently  Other Topics Concern   Not on file  Social History Narrative   Widowed mother of 4.   Quit smoking in 2008.   Occasional alcohol.   Social Drivers of Research scientist (physical sciences) Strain: Low Risk  (08/22/2023)   Overall Financial Resource Strain (CARDIA)    Difficulty of Paying Living Expenses: Not hard at all  Food Insecurity: No Food Insecurity (08/22/2023)   Hunger Vital Sign    Worried About Running Out of Food in the Last Year: Never true    Ran Out of Food in the Last Year: Never true  Transportation Needs: No Transportation Needs (08/22/2023)   PRAPARE - Administrator, Civil Service (Medical): No    Lack of Transportation (Non-Medical): No  Physical Activity: Inactive (08/22/2023)   Exercise Vital Sign    Days of Exercise per Week: 0 days    Minutes of Exercise per Session: 0 min  Stress: Stress Concern Present (08/22/2023)   [employer] of Occupational Health - Occupational Stress Questionnaire    Feeling of Stress : To some extent  Social Connections: Moderately Integrated (08/22/2023)   Social Connection and Isolation Panel    Frequency of Communication with Friends and Family: More than three times a week    Frequency of Social Gatherings with Friends and Family: Once a week    Attends Religious Services: More than 4 times per year    Active Member of [employer] or Organizations: Yes    Attends Banker Meetings: 1 to 4 times per year    Marital Status: Widowed  Intimate Partner Violence: Not At Risk (08/22/2023)   Humiliation, Afraid, Rape, and Kick questionnaire    Fear of Current or Ex-Partner: No    Emotionally Abused: No    Physically Abused: No    Sexually Abused: No   FH: Mother with hole in her heart, father with CAD.   ROS: All systems reviewed and negative except as per HPI.   Current Outpatient Medications  Medication Sig Dispense Refill   Aspirin -Caffeine (BC FAST PAIN RELIEF PO) Take 1 packet by mouth daily as needed (pain).     BELSOMRA 20 MG TABS Take 1 tablet by mouth at bedtime as needed (sleep).     carvedilol  (COREG ) 25 MG tablet Take 1 tablet (25 mg total) by mouth 2 (two) times daily with a  meal. 180 tablet 3   cetirizine  (ZYRTEC ) 10 MG tablet Take 1 tablet (10 mg total) by mouth daily. 30 tablet 0   empagliflozin  (JARDIANCE ) 10 MG TABS tablet Take 1 tablet (10 mg total) by mouth daily before breakfast. 90 tablet 3   famotidine  (PEPCID ) 20 MG tablet Take 1 tablet (20 mg total) by mouth 2 (two) times daily. 60 tablet 0   glucose blood test strip 1 each by Other route as needed for other. Use as instructed     hydrocortisone  1 % ointment Apply 1 Application topically 2 (two) times daily. 30 g 0   Iron , Ferrous Sulfate , 325 (65 Fe) MG TABS Take 325  mg by mouth daily. 90 tablet 1   Ketotifen Fumarate (ALLERGY  EYE DROPS OP) Place 1 drop into both eyes daily as needed (allergies).     rosuvastatin  (CRESTOR ) 20 MG tablet Take 1 tablet (20 mg total) by mouth daily. 90 tablet 3   Semaglutide ,0.25 or 0.5MG /DOS, (OZEMPIC , 0.25 OR 0.5 MG/DOSE,) 2 MG/3ML SOPN Inject 0.5 mg into the skin once a week. 3 mL 0   triamcinolone  cream (KENALOG ) 0.1 % Apply 1 Application topically 2 (two) times daily. 30 g 0   spironolactone  (ALDACTONE ) 25 MG tablet Take 1 tablet (25 mg total) by mouth daily. 90 tablet 3   No current facility-administered medications for this encounter.   BP (!) 140/80   Pulse 84   Wt 85.2 kg (187 lb 12.8 oz)   SpO2 97%   BMI 34.35 kg/m   Wt Readings from Last 3 Encounters:  07/01/24 85.2 kg (187 lb 12.8 oz)  07/01/24 84.6 kg (186 lb 9.6 oz)  06/01/24 85 kg (187 lb 8 oz)   Physical Exam General: NAD Neck: No JVD, no thyromegaly or thyroid  nodule.  Lungs: Clear to auscultation bilaterally with normal respiratory effort. CV: Nondisplaced PMI.  Heart regular S1/S2, no S3/S4, 1/6 soft SEM RUSB.  No peripheral edema.  No carotid bruit.  Normal pedal pulses.  Abdomen: Soft, nontender, no hepatosplenomegaly, no distention.  Skin: Intact without lesions or rashes.  Neurologic: Alert and oriented x 3.  Psych: Normal affect. Extremities: No clubbing or cyanosis.  HEENT: Normal.    Assessment/Plan: 1. Chronic diastolic CHF:  Echo in 4/23 showed EF 70-75%, severe LVH with some asymmetry of the basal septum, LV mid cavitary gradient peak 29 at rest and 51 with Valsalva, no SAM, normal RV.  Coronary CTA in 4/23 showed a moderate proximal LCx stenosis that did not appear hemodynamically significant by FFR. Her beta blocker was titrated up with LV mid-cavity gradient.  Stress echo was done in 12/23, showing that LV mid cavity gradient increased from 12 peak to 26 peak with exercise, PASP increased from 30 to 48 with exercise.  No wall motion abnormalities with exercise.  LHC/RHC in 4/24 showed no significant coronary disease and mildly elevated RA pressure.  Cardiac MRI in 7/24 showed EF 65%, moderate asymmetric septal hypertrophy with no mitral valve systolic anterior motion, normal RV EF 53%, no delayed enhancement.  She could have a lower risk variant of hypertrophic cardiomyopathy.  She does not have evidence by MRI for cardiac amyloidosis. Genetic testing showed TTN gene mutation of uncertain significance, rarely TTN mutations can be associated with HCM.  She has a very soft outflow murmur on exam.  She is euvolemic on exam, NYHA class II symptoms. - Unable to get insurance approval for finerenone, she is on spironolactone . BMET/BNP today.  - Continue Coreg  25 mg bid. - Unable to take diltiazem  CD (negative inotropy with mild mid-cavity gradient) due to rash.  - I will make appointment for her to see [facility] for evaluation of TTN gene mutation of uncertain significance.  - I will arrange for repeat echo, pay especial attention to searching for significant LVOT gradient.  - Restart Jardiance  10 mg daily.  2. CAD: Coronary CTA in 4/23 with moderate LCx stenosis, not hemodynamically significant by FFR. LHC in 4/24 showed nonobstructive CAD. No further episodes of chest pain. - Continue ASA + rosuvastatin .  3. Fe deficiency anemia: She has had IV Fe.  - Defer management to  PCP. 4. HTN: BP mildly elevated  lately.  - Increase spironolactone  to 25 mg daily, BMET in 10 days.  5. OSA: Mild by sleep study, not on CPAP. - Needs weight loss. 6. Obesity: Body mass index is 34.35 kg/m. - She is on semaglutide  with weight trending down.   Followup with APP in 3 months.   I spent 32 minutes reviewing records, interviewing/examining patient, and managing orders.   Ezra Shuck 07/01/24

## 2024-07-01 NOTE — Patient Instructions (Signed)
 Medication Changes:  INCREASE SPIRONOLACTONE  TO 25MG  ONCE DAILY   START: JARDIANCE  10MG  ONCE DAILY   Lab Work:  Labs done today, your results will be available in MyChart, we will contact you for abnormal readings.  THEN LABS AGAIN IN 10 DAYS AS SCHEDULED   Referrals:  YOU HAVE BEEN REFERRED TO GENETICS THEY WILL REACH OUT TO YOU OR CALL TO ARRANGE THIS. PLEASE CALL US  WITH ANY CONCERNS   Follow-Up in: 3 MONTHS WITH APP AS SCHEDULED   At the [facility], you and your health needs are our priority. We have a designated team specialized in the treatment of Heart Failure. This Care Team includes your primary Heart Failure Specialized Cardiologist (physician), Advanced Practice Providers (APPs- Physician Assistants and Nurse Practitioners), and Pharmacist who all work together to provide you with the care you need, when you need it.   You may see any of the following providers on your designated Care Team at your next follow up:  Dr. Toribio Fuel Dr. Ezra Shuck Dr. Ria Commander Dr. Odis Brownie Greig Mosses, NP Caffie Shed, [place] [facility] [place], [place] Beckey Coe, NP [place] Lee, NP Tinnie Redman, PharmD   Please be sure to bring in all your medications bottles to every appointment.   Need to Contact Us :  If you have any questions or concerns before your next appointment please send us  a message through [place] or call our office at [phone].    TO LEAVE A MESSAGE FOR THE NURSE SELECT OPTION 2, PLEASE LEAVE A MESSAGE INCLUDING: YOUR NAME DATE OF BIRTH CALL BACK NUMBER REASON FOR CALL**this is important as we prioritize the call backs  YOU WILL RECEIVE A CALL BACK THE SAME DAY AS LONG AS YOU CALL BEFORE 4:00 PM

## 2024-07-02 ENCOUNTER — Other Ambulatory Visit: Payer: Self-pay | Admitting: Nurse Practitioner

## 2024-07-02 DIAGNOSIS — E1165 Type 2 diabetes mellitus with hyperglycemia: Secondary | ICD-10-CM

## 2024-07-03 NOTE — Telephone Encounter (Signed)
 Requested Prescriptions  Pending Prescriptions Disp Refills   famotidine  (PEPCID ) 20 MG tablet [Pharmacy Med Name: FAMOTIDINE  20MG  TABLETS] 180 tablet     Sig: TAKE 1 TABLET(20 MG) BY MOUTH TWICE DAILY     Gastroenterology:  H2 Antagonists Passed - 07/03/2024 11:32 AM      Passed - Valid encounter within last 12 months    Recent Outpatient Visits           2 days ago Seasonal allergic rhinitis, unspecified trigger   [facility] Gareth Mliss FALCON, FNP   1 month ago Celestino, oral   [facility] Gareth Mliss FALCON, FNP   3 months ago B12 deficiency   [facility] Gareth Mliss FALCON, FNP   4 months ago Dizzy   [facility] Gareth Mliss FALCON, FNP       Future Appointments             In 3 weeks Gareth, Mliss FALCON, FNP [facility], [facility]

## 2024-07-04 NOTE — Telephone Encounter (Signed)
 Discontinued Reorder Dawn Dawn FALCON, FNP 04/13/24 1112   Requested Prescriptions  Refused Prescriptions Disp Refills   OZEMPIC , 0.25 OR 0.5 MG/DOSE, 2 MG/3ML SOPN [Pharmacy Med Name: OZEMPIC  0.25 OR 0.5MG  DOS(2MG /3ML)] 3 mL 0    Sig: INJECT 0.25 MG UNDER THE SKIN ONCE A WEEK     Endocrinology:  Diabetes - GLP-1 Receptor Agonists - semaglutide  Failed - 07/04/2024 11:03 AM      Failed - HBA1C in normal range and within 180 days    Hgb A1c MFr Bld  Date Value Ref Range Status  03/02/2024 6.8 (H) <5.7 % of total Hgb Final    Comment:    For someone without known diabetes, a hemoglobin A1c value of 6.5% or greater indicates that they may have  diabetes and this should be confirmed with a follow-up  test. . For someone with known diabetes, a value <7% indicates  that their diabetes is well controlled and a value  greater than or equal to 7% indicates suboptimal  control. A1c targets should be individualized based on  duration of diabetes, age, comorbid conditions, and  other considerations. . Currently, no consensus exists regarding use of hemoglobin A1c for diagnosis of diabetes for children. .          Passed - Cr in normal range and within 360 days    Creat  Date Value Ref Range Status  03/02/2024 0.83 0.50 - 1.05 mg/dL Final   Creatinine, Ser  Date Value Ref Range Status  07/01/2024 0.94 0.44 - 1.00 mg/dL Final   Creatinine, Urine  Date Value Ref Range Status  06/25/2023 99 20 - 275 mg/dL Final         Passed - Valid encounter within last 6 months    Recent Outpatient Visits           3 days ago Seasonal allergic rhinitis, unspecified trigger   [facility] Dawn Dawn FALCON, FNP   1 month ago Dawn Rice, oral   [facility] Dawn Dawn FALCON, FNP   3 months ago B12 deficiency   [facility] Dawn Dawn FALCON, FNP   4 months ago Dizzy   [facility] Dawn Dawn FALCON, FNP        Future Appointments             In 3 weeks Dawn, Dawn FALCON, FNP [facility], [facility]

## 2024-07-06 ENCOUNTER — Telehealth: Payer: Self-pay

## 2024-07-06 NOTE — Telephone Encounter (Signed)
 Refill on Semaglutide ,0.25 or 0.5MG /DOS, (OZEMPIC , 0.25 OR 0.5 MG/DOSE,) 2 MG/3ML SOPN

## 2024-07-07 ENCOUNTER — Ambulatory Visit (INDEPENDENT_AMBULATORY_CARE_PROVIDER_SITE_OTHER): Admitting: Nurse Practitioner

## 2024-07-07 ENCOUNTER — Encounter: Payer: Self-pay | Admitting: Nurse Practitioner

## 2024-07-07 VITALS — BP 122/78 | HR 90 | Temp 97.6°F | Resp 18 | Ht 62.0 in | Wt 189.2 lb

## 2024-07-07 DIAGNOSIS — G4733 Obstructive sleep apnea (adult) (pediatric): Secondary | ICD-10-CM

## 2024-07-07 DIAGNOSIS — R569 Unspecified convulsions: Secondary | ICD-10-CM

## 2024-07-07 DIAGNOSIS — E785 Hyperlipidemia, unspecified: Secondary | ICD-10-CM

## 2024-07-07 DIAGNOSIS — I2510 Atherosclerotic heart disease of native coronary artery without angina pectoris: Secondary | ICD-10-CM | POA: Diagnosis not present

## 2024-07-07 DIAGNOSIS — D508 Other iron deficiency anemias: Secondary | ICD-10-CM | POA: Diagnosis not present

## 2024-07-07 DIAGNOSIS — I10 Essential (primary) hypertension: Secondary | ICD-10-CM

## 2024-07-07 DIAGNOSIS — I509 Heart failure, unspecified: Secondary | ICD-10-CM

## 2024-07-07 DIAGNOSIS — E669 Obesity, unspecified: Secondary | ICD-10-CM

## 2024-07-07 DIAGNOSIS — E1165 Type 2 diabetes mellitus with hyperglycemia: Secondary | ICD-10-CM

## 2024-07-07 DIAGNOSIS — Z1211 Encounter for screening for malignant neoplasm of colon: Secondary | ICD-10-CM

## 2024-07-07 MED ORDER — SEMAGLUTIDE (1 MG/DOSE) 4 MG/3ML ~~LOC~~ SOPN: 1.0000 mg | PEN_INJECTOR | SUBCUTANEOUS | 1 refills | Status: DC

## 2024-07-07 NOTE — Progress Notes (Signed)
 BP 122/78   Pulse 90   Temp 97.6 F (36.4 C)   Resp 18   Ht 5' 2 (1.575 m)   Wt 189 lb 3.2 oz (85.8 kg)   SpO2 95%   BMI 34.61 kg/m    Subjective:    Patient ID: Dawn Rice, female    DOB: Jan 13, 1956, 68 y.o.   MRN: [id number]  HPI: Dawn Rice is a 68 y.o. female  Chief Complaint  Patient presents with   Medical Management of Chronic Issues   Medication Refill    Discussed the use of AI scribe software for clinical note transcription with the patient, who gave verbal consent to proceed.  History of Present Illness Dawn Rice is a 67 year old female with diabetes, hypertension, coronary artery disease, heart failure, hyperlipidemia, obesity, and seizures who presents for a routine follow-up.  Her blood sugar levels have been stable, with her last A1c in March being 6.8. She ran out of Ozempic  last week and is seeking a refill. She is concerned about gaining weight despite not eating much, noting a recent weight gain of two pounds. Her current weight is 189 pounds, with a BMI of 34.61.  She has a history of iron  deficiency anemia, with a hemoglobin of 11.6, iron  at 34, and ferritin at 6 four months ago. She occasionally feels dazed and is concerned about her iron  levels.  She has mild sleep apnea diagnosed by a sleep study but is not currently using CPAP. She experiences occasional acid reflux, which she manages with medication before bed.  She has a rash on her left arm for which she has been using a steroid cream, but it has not improved.  She has not experienced any seizures recently and tries to avoid the sun and heat, which she finds bothersome.  Her mammogram is due in September.  Current weight: 189 lbs Current BMI: 34.61  Waist Measurement : 46 inches      11/27/2023    1:49 PM 08/22/2023   10:23 AM 07/25/2023    2:02 PM  Depression screen PHQ 2/9  Decreased Interest 0 0 0  Down, Depressed, Hopeless 0 0 0  PHQ - 2 Score 0 0 0   Altered sleeping 0    Tired, decreased energy 0    Change in appetite 0    Feeling bad or failure about yourself  0    Trouble concentrating 0    Moving slowly or fidgety/restless 0    Suicidal thoughts 0    PHQ-9 Score 0      Relevant past medical, surgical, family and social history reviewed and updated as indicated. Interim medical history since our last visit reviewed. Allergies and medications reviewed and updated.  Review of Systems  Constitutional: Negative for fever or weight change.  Respiratory: Negative for cough and shortness of breath.   Cardiovascular: Negative for chest pain or palpitations.  Gastrointestinal: Negative for abdominal pain, no bowel changes.  Musculoskeletal: Negative for gait problem or joint swelling.  Skin: Negative for rash.  Neurological: Negative for dizziness or headache.  No other specific complaints in a complete review of systems (except as listed in HPI above).      Objective:     BP 122/78   Pulse 90   Temp 97.6 F (36.4 C)   Resp 18   Ht 5' 2 (1.575 m)   Wt 189 lb 3.2 oz (85.8 kg)   SpO2 95%   BMI 34.61 kg/m  Wt Readings from Last 3 Encounters:  07/07/24 189 lb 3.2 oz (85.8 kg)  07/01/24 187 lb 12.8 oz (85.2 kg)  07/01/24 186 lb 9.6 oz (84.6 kg)    Physical Exam Physical Exam VITALS: BP- 122/78 MEASUREMENTS: Weight- 189, BMI- 34.61. GENERAL: Alert, cooperative, well developed, no acute distress HEENT: Normocephalic, normal oropharynx, moist mucous membranes CHEST: Clear to auscultation bilaterally, no wheezes, rhonchi, or crackles CARDIOVASCULAR: Normal heart rate and rhythm, S1 and S2 normal without murmurs ABDOMEN: Soft, non-tender, non-distended, without organomegaly, normal bowel sounds EXTREMITIES: No cyanosis or edema, extremities normal NEUROLOGICAL: Cranial nerves grossly intact, moves all extremities without gross motor or sensory deficit   Results for orders placed or performed during the hospital  encounter of 07/01/24  Lipid panel   Collection Time: 07/01/24  3:09 PM  Result Value Ref Range   Cholesterol 147 0 - 200 mg/dL   Triglycerides 876 <849 mg/dL   HDL 48 >59 mg/dL   Total CHOL/HDL Ratio 3.1 RATIO   VLDL 25 0 - 40 mg/dL   LDL Cholesterol 74 0 - 99 mg/dL  Basic Metabolic Panel (BMET)   Collection Time: 07/01/24  3:09 PM  Result Value Ref Range   Sodium 142 135 - 145 mmol/L   Potassium 4.4 3.5 - 5.1 mmol/L   Chloride 104 98 - 111 mmol/L   CO2 26 22 - 32 mmol/L   Glucose, Bld 136 (H) 70 - 99 mg/dL   BUN 16 8 - 23 mg/dL   Creatinine, Ser 9.05 0.44 - 1.00 mg/dL   Calcium  9.6 8.9 - 10.3 mg/dL   GFR, Estimated >39 >39 mL/min   Anion gap 12 5 - 15  B Nat Peptide   Collection Time: 07/01/24  3:09 PM  Result Value Ref Range   B Natriuretic Peptide 11.8 0.0 - 100.0 pg/mL          Assessment & Plan:   Problem List Items Addressed This Visit       Cardiovascular and Mediastinum   Essential hypertension, benign (Chronic)   Relevant Orders   CBC with Differential/Platelet   Coronary artery disease involving native coronary artery of native heart   Chronic congestive heart failure (HCC)     Respiratory   OSA (obstructive sleep apnea)     Endocrine   Diabetes (HCC) - Primary   Relevant Medications   Semaglutide , 1 MG/DOSE, 4 MG/3ML SOPN   Other Relevant Orders   HM Diabetes Foot Exam (Completed)   Urine Microalbumin w/creat. ratio   Hemoglobin A1c     Other   Hyperlipidemia (Chronic)   Seizures (HCC)   Obesity (BMI 30-39.9)   Relevant Medications   Semaglutide , 1 MG/DOSE, 4 MG/3ML SOPN   Other Relevant Orders   TSH   Other Visit Diagnoses       Screening for colon cancer       Relevant Orders   Cologuard     Other iron  deficiency anemia       Relevant Orders   CBC with Differential/Platelet   Iron , TIBC and Ferritin Panel        Assessment and Plan Assessment & Plan Type 2 diabetes mellitus Type 2 diabetes mellitus with recent A1c of 6.8%  in March. Blood sugars typically below 100 mg/dL unless consuming sugary drinks. Currently on Ozempic  0.5 mg weekly, but requires a refill. Discussed weight gain concerns despite low food intake. Agreed to increase Ozempic  to 1 mg weekly to address weight gain. - Increase Ozempic  to 1 mg  weekly - Order A1c test - Perform diabetic foot exam - Order diabetic microalbumin urine test  Obesity Obesity with BMI of 34.61. Reports weight gain despite low food intake and physical activity involving running with her grandchild. Discussed challenges of weight loss and importance of weight-bearing exercises like walking. - Encourage continuation of lifestyle modifications, including dietary management and regular exercise. -continue to increase physical activity, getting at least 150 min of physical activity a week.  Work on including Runner, broadcasting/film/video 2 days a week.  - continue eating at a calorie deficit 1600-1700 cal a day, eating a well balanced diet with whole foods, avoiding processed foods.     Hypertension Hypertension managed with carvedilol . Blood pressure today is 122/78 mmHg, indicating good control.  Coronary artery disease Coronary artery disease. Recent cardiology visit on July 01, 2024, with normal labs including cholesterol and metabolic panel.  Heart failure Heart failure management includes spironolactone , recently increased to 25 mg daily by cardiology. No acute issues discussed.  Iron  deficiency anemia Iron  deficiency anemia with previous hemoglobin of 11.6 g/dL and ferritin of 6 ng/mL. Reports occasional dizziness. Recent labs at cardiology did not include iron  studies. Agreed to check iron  levels due to low ferritin and occasional dizziness. - Order blood counts and iron  studies  Seizure disorder Seizure disorder with no recent seizures. Avoids sun exposure to prevent seizures.  Rash, left arm Persistent rash on left arm. Previous treatment with steroid cream was ineffective.  Has a dermatologist but no upcoming appointment. Advised to see a dermatologist if rash does not improve. - Continue using steroid cream - Consider dermatology referral if no improvement in one week  Gastroesophageal reflux disease (GERD) GERD well-controlled.  Sleep apnea Mild sleep apnea diagnosed by sleep study. Not currently on CPAP therapy. Discussed symptoms and management strategies.        Follow up plan: Return for CPE w/ pap.

## 2024-07-08 ENCOUNTER — Other Ambulatory Visit: Payer: Self-pay | Admitting: Pharmacist

## 2024-07-08 ENCOUNTER — Ambulatory Visit: Payer: Self-pay | Admitting: Nurse Practitioner

## 2024-07-08 DIAGNOSIS — G5613 Other lesions of median nerve, bilateral upper limbs: Secondary | ICD-10-CM | POA: Diagnosis not present

## 2024-07-08 DIAGNOSIS — E1165 Type 2 diabetes mellitus with hyperglycemia: Secondary | ICD-10-CM

## 2024-07-08 LAB — CBC WITH DIFFERENTIAL/PLATELET
Absolute Lymphocytes: 1601 {cells}/uL (ref 850–3900)
Absolute Monocytes: 340 {cells}/uL (ref 200–950)
Basophils Absolute: 32 {cells}/uL (ref 0–200)
Basophils Relative: 0.7 %
Eosinophils Absolute: 373 {cells}/uL (ref 15–500)
Eosinophils Relative: 8.1 %
HCT: 38.4 % (ref 35.0–45.0)
Hemoglobin: 11.4 g/dL — ABNORMAL LOW (ref 11.7–15.5)
MCH: 24.8 pg — ABNORMAL LOW (ref 27.0–33.0)
MCHC: 29.7 g/dL — ABNORMAL LOW (ref 32.0–36.0)
MCV: 83.5 fL (ref 80.0–100.0)
MPV: 11.6 fL (ref 7.5–12.5)
Monocytes Relative: 7.4 %
Neutro Abs: 2254 {cells}/uL (ref 1500–7800)
Neutrophils Relative %: 49 %
Platelets: 316 Thousand/uL (ref 140–400)
RBC: 4.6 Million/uL (ref 3.80–5.10)
RDW: 16.5 % — ABNORMAL HIGH (ref 11.0–15.0)
Total Lymphocyte: 34.8 %
WBC: 4.6 Thousand/uL (ref 3.8–10.8)

## 2024-07-08 LAB — MICROALBUMIN / CREATININE URINE RATIO
Creatinine, Urine: 57 mg/dL (ref 20–275)
Microalb Creat Ratio: 116 mg/g{creat} — ABNORMAL HIGH (ref <30)
Microalb, Ur: 6.6 mg/dL

## 2024-07-08 LAB — IRON,TIBC AND FERRITIN PANEL
%SAT: 9 % — ABNORMAL LOW (ref 16–45)
Ferritin: 4 ng/mL — ABNORMAL LOW (ref 16–288)
Iron: 36 ug/dL — ABNORMAL LOW (ref 45–160)
TIBC: 388 ug/dL (ref 250–450)

## 2024-07-08 LAB — HEMOGLOBIN A1C
Hgb A1c MFr Bld: 6.8 % — ABNORMAL HIGH (ref <5.7)
Mean Plasma Glucose: 148 mg/dL
eAG (mmol/L): 8.2 mmol/L

## 2024-07-08 LAB — TSH: TSH: 0.61 m[IU]/L (ref 0.40–4.50)

## 2024-07-08 NOTE — Patient Instructions (Signed)
 Goals Addressed             This Visit's Progress    Pharmacy Goals       The goal A1c is less than 7%. This is the best way to reduce the risk of the long term complications of diabetes, including heart disease, kidney disease, eye disease, strokes, and nerve damage. An A1c of less than 7% corresponds with fasting sugars less than 130 and 2 hour after meal sugars less than 180.   Estelle Grumbles, PharmD, [facility] [phone]

## 2024-07-08 NOTE — Progress Notes (Signed)
 07/08/2024 Name: Dawn Rice MRN: [id number] DOB: 11-May-1956  Chief Complaint  Patient presents with   Medication Management    Dawn Rice is a 68 y.o. year old female who presented for a telephone visit.   They were referred to the pharmacist by their PCP for assistance in managing medication access.      Subjective:   Care Team: Primary Care Provider: Gareth Mliss FALCON, FNP Cardiologist: Tobb, Kardie, DO Heart Failure Specialist: Rolan Ezra RAMAN, MD; Next Scheduled Visit: 07/01/2024    Medication Access/Adherence  Current Pharmacy:  [id number] [place], [place] - [address] AT [facility] OF [address] [place] [address] Phone: [phone] Fax: [phone]  [id number] [place], [place] - [address] AT [facility] OF [address] & [facility] [address] [place] [address] Phone: [phone] Fax: [phone]  [id number] - [place], [place] - [address] AT [facility] OF [address] [place] [address] Phone: [phone] Fax: [phone]   Patient reports affordability concerns with their medications: No Patient reports access/transportation concerns to their pharmacy: No  Patient reports adherence concerns with their medications:  No     Denies using weekly pillbox   From review of chart, note per lab result message from PCP, provider advised patient Your iron  is still very low. Have you been taking your iron  supplement? If so I recommend going to hematology. - Patient states that she has been taking her OTC iron  when she thinks about it, "but not that often". Will restart taking ferrous sulfate  325 mg EVERY DAY   Diabetes:   Current medications:  - Ozempic  0.5 mg weekly on Mondays.  Plans to increase to Ozempic  1 mg weekly on 7/21 - Jardiance  10 mg daily before breakfast   Medications tried in the past: Rybelsus   (cost), Synjardy    Current glucose readings: morning fasting readings primarily ranging 100-110   Denies recent of hypoglycemia    Statin therapy: rosuvastatin  20 mg daily  Objective:  Lab Results  Component Value Date   HGBA1C 6.8 (H) 07/07/2024    Lab Results  Component Value Date   CREATININE 0.94 07/01/2024   BUN 16 07/01/2024   NA 142 07/01/2024   K 4.4 07/01/2024   CL 104 07/01/2024   CO2 26 07/01/2024    Lab Results  Component Value Date   CHOL 147 07/01/2024   HDL 48 07/01/2024   LDLCALC 74 07/01/2024   TRIG 123 07/01/2024   CHOLHDL 3.1 07/01/2024   BP Readings from Last 3 Encounters:  07/07/24 122/78  07/01/24 (!) 140/80  07/01/24 136/84   Pulse Readings from Last 3 Encounters:  07/07/24 90  07/01/24 84  07/01/24 97    Current Outpatient Medications on File Prior to Visit  Medication Sig Dispense Refill   Aspirin -Caffeine (BC FAST PAIN RELIEF PO) Take 1 packet by mouth daily as needed (pain).     carvedilol  (COREG ) 25 MG tablet Take 1 tablet (25 mg total) by mouth 2 (two) times daily with a meal. 180 tablet 3   cetirizine  (ZYRTEC ) 10 MG tablet Take 1 tablet (10 mg total) by mouth daily. 30 tablet 0   empagliflozin  (JARDIANCE ) 10 MG TABS tablet Take 1 tablet (10 mg total) by mouth daily before breakfast. 90 tablet 3   famotidine  (PEPCID ) 20 MG tablet Take 1 tablet (20 mg total) by mouth 2 (  two) times daily. 60 tablet 0   glucose blood test strip 1 each by Other route as needed for other. Use as instructed     hydrocortisone  1 % ointment Apply 1 Application topically 2 (two) times daily. 30 g 0   Iron , Ferrous Sulfate , 325 (65 Fe) MG TABS Take 325 mg by mouth daily. 90 tablet 1   Ketotifen Fumarate (ALLERGY  EYE DROPS OP) Place 1 drop into both eyes daily as needed (allergies).     rosuvastatin  (CRESTOR ) 20 MG tablet Take 1 tablet (20 mg total) by mouth daily. 90 tablet 3   Semaglutide , 1 MG/DOSE, 4 MG/3ML SOPN Inject 1 mg as directed once a week. 9 mL 1    spironolactone  (ALDACTONE ) 25 MG tablet Take 1 tablet (25 mg total) by mouth daily. 90 tablet 3   triamcinolone  cream (KENALOG ) 0.1 % Apply 1 Application topically 2 (two) times daily. 30 g 0   No current facility-administered medications on file prior to visit.       Assessment/Plan:   Encourage patient to obtain and start using weekly pillbox to aid with medication adherence  Patient to start taking ferrous sulfate  325 mg EVERY DAY as directed by PCP - Will send message to PCP  Diabetes: - Reviewed goal A1c, goal fasting, and goal 2 hour post prandial glucose - Reviewed dietary modifications including importance of having regular well-balanced meals and snacks throughout the day, while controlling carbohydrate portion sizes - Recommend to check glucose, keep log of results and have this record to review at upcoming medical appointments. Patient to contact provider office sooner if needed for readings outside of established parameters or symptoms   Follow Up Plan: Clinical Pharmacist will follow up with patient by telephone in 3 months.   Sharyle Sia, PharmD, [facility] [phone]

## 2024-07-09 DIAGNOSIS — G5603 Carpal tunnel syndrome, bilateral upper limbs: Secondary | ICD-10-CM | POA: Diagnosis not present

## 2024-07-09 DIAGNOSIS — M654 Radial styloid tenosynovitis [de Quervain]: Secondary | ICD-10-CM | POA: Diagnosis not present

## 2024-07-13 ENCOUNTER — Ambulatory Visit (HOSPITAL_COMMUNITY)
Admission: RE | Admit: 2024-07-13 | Discharge: 2024-07-13 | Disposition: A | Discharge status: Home / Self Care | Source: Ambulatory Visit | Attending: Cardiology | Admitting: Cardiology

## 2024-07-13 DIAGNOSIS — I5032 Chronic diastolic (congestive) heart failure: Secondary | ICD-10-CM | POA: Insufficient documentation

## 2024-07-13 LAB — BASIC METABOLIC PANEL WITH GFR
Anion gap: 7 (ref 5–15)
BUN: 11 mg/dL (ref 8–23)
CO2: 23 mmol/L (ref 22–32)
Calcium: 9.4 mg/dL (ref 8.9–10.3)
Chloride: 109 mmol/L (ref 98–111)
Creatinine, Ser: 0.91 mg/dL (ref 0.44–1.00)
GFR, Estimated: >60 mL/min (ref >60)
Glucose, Bld: 139 mg/dL — ABNORMAL HIGH (ref 70–99)
Potassium: 4.1 mmol/L (ref 3.5–5.1)
Sodium: 139 mmol/L (ref 135–145)

## 2024-07-24 ENCOUNTER — Ambulatory Visit (HOSPITAL_COMMUNITY): Attending: Nurse Practitioner

## 2024-07-24 ENCOUNTER — Telehealth (HOSPITAL_COMMUNITY): Payer: Self-pay

## 2024-07-24 NOTE — Telephone Encounter (Signed)
  [facility]   Pre-operative Risk Assessment       Request for Surgical Clearance    Procedure:  Left carpal tunnel release and steroid injection  { Date of Surgery:  Clearance TBD                               { Surgeon:  Ozell Hammed MD Surgeon's Group or Practice Name:  [facility]  Phone number:  [phone] Fax number:  [phone] { Type of Clearance Requested:   - Medical  - Pharmacy:  Hold      { Type of Anesthesia:  not listed  { Additional requests/questions:  Please fax a copy of clearance  to the surgeon's office.  Signed, Britainy Kozub B Amadu Schlageter   07/24/2024, 9:29 AM   [facility]

## 2024-07-27 NOTE — Telephone Encounter (Signed)
 [place], Harlene HERO, FNP to Me (Selected Message)     07/24/24  4:12 PM She is at acceptable CV risk for procedure. RCRI risk = 2 pts (5% risk of major cardiac event)  Copy of clearance with provider recommendations faxed to requesting office via Epic fax function.

## 2024-07-29 ENCOUNTER — Encounter: Payer: Self-pay | Admitting: Nurse Practitioner

## 2024-07-29 ENCOUNTER — Ambulatory Visit: Admitting: Nurse Practitioner

## 2024-07-29 VITALS — BP 150/88 | HR 81 | Resp 16 | Ht 62.0 in | Wt 186.6 lb

## 2024-07-29 DIAGNOSIS — Z780 Asymptomatic menopausal state: Secondary | ICD-10-CM

## 2024-07-29 DIAGNOSIS — I10 Essential (primary) hypertension: Secondary | ICD-10-CM | POA: Diagnosis not present

## 2024-07-29 DIAGNOSIS — Z0001 Encounter for general adult medical examination with abnormal findings: Secondary | ICD-10-CM | POA: Diagnosis not present

## 2024-07-29 DIAGNOSIS — Z1231 Encounter for screening mammogram for malignant neoplasm of breast: Secondary | ICD-10-CM

## 2024-07-29 DIAGNOSIS — Z13820 Encounter for screening for osteoporosis: Secondary | ICD-10-CM

## 2024-07-29 DIAGNOSIS — Z0000 Encounter for general adult medical examination without abnormal findings: Secondary | ICD-10-CM

## 2024-07-29 NOTE — Progress Notes (Signed)
 Name: [name]   MRN: [id number]    DOB: February 21, 1956   Date:07/29/2024       Progress Note  Subjective  Chief Complaint  Chief Complaint  Patient presents with   Annual Exam    Pt skipped BP pill last night    HPI  Patient presents for annual CPE. Discussed the use of AI scribe software for clinical note transcription with the patient, who gave verbal consent to proceed.  History of Present Illness Dawn Rice is a 68 year old female who presents for a routine follow-up visit.  Appetite disturbance - Lack of appetite, stating 'I don't eat at all' and 'I'm not hungry.' - Occasionally eats watermelon, not fond of chicken, rice, or greens. - Attempts to eat a vegetable daily. - Describes herself as not being a big eater.  Fatigue and sleep patterns - Increased fatigue recently, sleeping a lot. - Able to fall asleep easily. - Granddaughter has noticed increased tiredness. - No snoring.  Physical activity - Physically active throughout the day due to caring for her one-year-old and cleaning her home. - No structured exercise, but constantly moving due to home responsibilities.  Bowel and bladder function - No issues with bowel movements. - Able to go to the bathroom without difficulty. - No incontinence.  Medication adherence - Forgot to take blood pressure medication last night before bed.  Sexual activity and safety - Not currently sexually active. - No experience of violence at home.    Diet: patient she will eat watermelon, greens,  tries to eat vegetables. She does not eat much protein Exercise: chasing 68 year old  Sleep: 8 hours, still tired Last dental exam: September has appointment, goes every 6 months  Last eye exam: this year,  wears glasses  Flowsheet Row Office Visit from 07/29/2024 in [facility]  AUDIT-C Score 1   Depression: Phq 9 is  negative    07/29/2024   10:11 AM 11/27/2023    1:49 PM 08/22/2023   10:23  AM 07/25/2023    2:02 PM 06/25/2023    9:59 AM  Depression screen PHQ 2/9  Decreased Interest 0 0 0 0 0  Down, Depressed, Hopeless 0 0 0 0 0  PHQ - 2 Score 0 0 0 0 0  Altered sleeping  0     Tired, decreased energy  0     Change in appetite  0     Feeling bad or failure about yourself   0     Trouble concentrating  0     Moving slowly or fidgety/restless  0     Suicidal thoughts  0     PHQ-9 Score  0      Hypertension: BP Readings from Last 3 Encounters:  07/29/24 (!) 150/88  07/07/24 122/78  07/01/24 (!) 140/80   Obesity: Wt Readings from Last 3 Encounters:  07/29/24 186 lb 9.6 oz (84.6 kg)  07/07/24 189 lb 3.2 oz (85.8 kg)  07/01/24 187 lb 12.8 oz (85.2 kg)   BMI Readings from Last 3 Encounters:  07/29/24 34.13 kg/m  07/07/24 34.61 kg/m  07/01/24 34.35 kg/m     Vaccines:  HPV: up to at age 40 , ask insurance if age between 70-45  Shingrix: 43-64 yo and ask insurance if covered when patient above 82 yo Pneumonia:  educated and discussed with patient. Flu:  educated and discussed with patient.  Hep C Screening: completed STD testing and prevention (HIV/chl/gon/syphilis): completed Intimate  partner violence:none Sexual History : not sexually active Menstrual History/LMP/Abnormal Bleeding: hysterectomy  Incontinence Symptoms: none  Breast cancer:  - Last Mammogram: 09/10/2023 - BRCA gene screening: none  Osteoporosis: Discussed high calcium  and vitamin D  supplementation, weight bearing exercises  Cervical cancer screening: hysterectomy   Skin cancer: Discussed monitoring for atypical lesions  Colorectal cancer: cologuard has been ordered but not completed   Lung cancer:   Low Dose CT Chest recommended if Age 43-80 years, 20 pack-year currently smoking OR have quit w/in 15years. Patient does not qualify.   ECG: 07/01/2024  Advanced Care Planning: A voluntary discussion about advance care planning including the explanation and discussion of advance directives.   Discussed health care proxy and Living will, and the patient was able to identify a health care proxy as children.  Patient does not have a living will at present time. If patient does have living will, I have requested they bring this to the clinic to be scanned in to their chart.  Lipids: Lab Results  Component Value Date   CHOL 147 07/01/2024   CHOL 154 06/25/2023   CHOL 140 03/14/2023   Lab Results  Component Value Date   HDL 48 07/01/2024   HDL 55 06/25/2023   HDL 49 03/14/2023   Lab Results  Component Value Date   LDLCALC 74 07/01/2024   LDLCALC 79 06/25/2023   LDLCALC 69 03/14/2023   Lab Results  Component Value Date   TRIG 123 07/01/2024   TRIG 112 06/25/2023   TRIG 110 03/14/2023   Lab Results  Component Value Date   CHOLHDL 3.1 07/01/2024   CHOLHDL 2.8 06/25/2023   CHOLHDL 2.9 03/14/2023   No results found for: LDLDIRECT  Glucose: Glucose, Bld  Date Value Ref Range Status  07/13/2024 139 (H) 70 - 99 mg/dL Final    Comment:    Glucose reference range applies only to samples taken after fasting for at least 8 hours.  07/01/2024 136 (H) 70 - 99 mg/dL Final    Comment:    Glucose reference range applies only to samples taken after fasting for at least 8 hours.  03/17/2024 138 (H) 70 - 99 mg/dL Final    Comment:    Glucose reference range applies only to samples taken after fasting for at least 8 hours.   Glucose-Capillary  Date Value Ref Range Status  03/29/2023 113 (H) 70 - 99 mg/dL Final    Comment:    Glucose reference range applies only to samples taken after fasting for at least 8 hours.  07/24/2017 91 65 - 99 mg/dL Final  91/98/7981 79 65 - 99 mg/dL Final    Patient Active Problem List   Diagnosis Date Noted   OSA (obstructive sleep apnea) 07/07/2024   Chronic congestive heart failure (HCC) 06/25/2023   Coronary artery disease involving native coronary artery of native heart 09/16/2022   Obesity (BMI 30-39.9) 09/16/2022   House dust mite  allergy  07/08/2019   Essential hypertension, benign    Hyperlipidemia 01/19/2017   Diabetes (HCC) 01/19/2017   Seizures (HCC) 01/19/2017   Gout     Past Surgical History:  Procedure Laterality Date   ABDOMINAL HYSTERECTOMY     APPENDECTOMY     CATARACT EXTRACTION     CATARACT EXTRACTION W/ INTRAOCULAR LENS  IMPLANT, BILATERAL Bilateral 2016-2017   right-left   CHOLECYSTECTOMY  03/2009   lap/notes 04/25/2011   LAPAROSCOPIC APPENDECTOMY N/A 07/21/2017   Procedure: APPENDECTOMY LAPAROSCOPIC;  Surgeon: Belinda Cough, MD;  Location[facility]  OR;  Service: General;  Laterality: N/A;   RIGHT/LEFT HEART CATH AND CORONARY ANGIOGRAPHY N/A 03/29/2023   Procedure: RIGHT/LEFT HEART CATH AND CORONARY ANGIOGRAPHY;  Surgeon: Rolan Ezra RAMAN, MD;  Location: [facility] INVASIVE CV LAB;  Service: Cardiovascular;  Laterality: N/A;   TUBAL LIGATION      Family History  Problem Relation Age of Onset   Diabetes Mother    Hypertension Mother    Heart failure Mother    Cataracts Mother    Diabetes Father    Coronary artery disease Father    Retinitis pigmentosa Sister    Amblyopia Neg Hx    Blindness Neg Hx    Glaucoma Neg Hx    Macular degeneration Neg Hx    Retinal detachment Neg Hx    Strabismus Neg Hx     Social History   Socioeconomic History   Marital status: Widowed    Spouse name: Not on file   Number of children: 4   Years of education: Not on file   Highest education level: Not on file  Occupational History   Not on file  Tobacco Use   Smoking status: Former    Current packs/day: 0.00    Average packs/day: 0.5 packs/day for 20.0 years (10.0 ttl pk-yrs)    Types: Cigarettes    Start date: 30    Quit date: 2009    Years since quitting: 16.6   Smokeless tobacco: Never  Vaping Use   Vaping status: Never Used  Substance and Sexual Activity   Alcohol use: Yes    Comment: 07/22/2017 glass of wine maybe once/month   Drug use: No   Sexual activity: Not Currently  Other Topics Concern    Not on file  Social History Narrative   Widowed mother of 4.   Quit smoking in 2008.   Occasional alcohol.   Social Drivers of Corporate investment banker Strain: Low Risk  (07/29/2024)   Overall Financial Resource Strain (CARDIA)    Difficulty of Paying Living Expenses: Not hard at all  Food Insecurity: No Food Insecurity (07/29/2024)   Hunger Vital Sign    Worried About Running Out of Food in the Last Year: Never true    Ran Out of Food in the Last Year: Never true  Transportation Needs: No Transportation Needs (07/29/2024)   PRAPARE - Administrator, Civil Service (Medical): No    Lack of Transportation (Non-Medical): No  Physical Activity: Inactive (07/29/2024)   Exercise Vital Sign    Days of Exercise per Week: 0 days    Minutes of Exercise per Session: 0 min  Stress: No Stress Concern Present (07/29/2024)   [employer] of Occupational Health - Occupational Stress Questionnaire    Feeling of Stress: Only a little  Social Connections: Moderately Integrated (07/29/2024)   Social Connection and Isolation Panel    Frequency of Communication with Friends and Family: More than three times a week    Frequency of Social Gatherings with Friends and Family: More than three times a week    Attends Religious Services: More than 4 times per year    Active Member of [employer] or Organizations: Yes    Attends Banker Meetings: 1 to 4 times per year    Marital Status: Widowed  Intimate Partner Violence: Not At Risk (07/29/2024)   Humiliation, Afraid, Rape, and Kick questionnaire    Fear of Current or Ex-Partner: No    Emotionally Abused: No    Physically Abused: No  Sexually Abused: No     Current Outpatient Medications:    carvedilol  (COREG ) 25 MG tablet, Take 1 tablet (25 mg total) by mouth 2 (two) times daily with a meal., Disp: 180 tablet, Rfl: 3   cetirizine  (ZYRTEC ) 10 MG tablet, Take 1 tablet (10 mg total) by mouth daily., Disp: 30 tablet, Rfl: 0    empagliflozin  (JARDIANCE ) 10 MG TABS tablet, Take 1 tablet (10 mg total) by mouth daily before breakfast., Disp: 90 tablet, Rfl: 3   famotidine  (PEPCID ) 20 MG tablet, Take 1 tablet (20 mg total) by mouth 2 (two) times daily., Disp: 60 tablet, Rfl: 0   glucose blood test strip, 1 each by Other route as needed for other. Use as instructed, Disp: , Rfl:    hydrocortisone  1 % ointment, Apply 1 Application topically 2 (two) times daily., Disp: 30 g, Rfl: 0   Iron , Ferrous Sulfate , 325 (65 Fe) MG TABS, Take 325 mg by mouth daily., Disp: 90 tablet, Rfl: 1   Ketotifen Fumarate (ALLERGY  EYE DROPS OP), Place 1 drop into both eyes daily as needed (allergies)., Disp: , Rfl:    rosuvastatin  (CRESTOR ) 20 MG tablet, Take 1 tablet (20 mg total) by mouth daily., Disp: 90 tablet, Rfl: 3   Semaglutide , 1 MG/DOSE, 4 MG/3ML SOPN, Inject 1 mg as directed once a week., Disp: 9 mL, Rfl: 1   spironolactone  (ALDACTONE ) 25 MG tablet, Take 1 tablet (25 mg total) by mouth daily., Disp: 90 tablet, Rfl: 3   triamcinolone  cream (KENALOG ) 0.1 %, Apply 1 Application topically 2 (two) times daily., Disp: 30 g, Rfl: 0   Aspirin -Caffeine (BC FAST PAIN RELIEF PO), Take 1 packet by mouth daily as needed (pain). (Patient not taking: Reported on 07/29/2024), Disp: , Rfl:   Allergies  Allergen Reactions   Contrave [Naltrexone-Bupropion Hcl Er] Nausea And Vomiting   Metformin  And Related Nausea Only   Penicillins Itching    Has patient had a PCN reaction causing immediate rash, facial/tongue/throat swelling, SOB or lightheadedness with hypotension: No Has patient had a PCN reaction causing severe rash involving mucus membranes or skin necrosis: No Has patient had a PCN reaction that required hospitalization No Has patient had a PCN reaction occurring within the last 10 years: No If all of the above answers are NO, then may proceed with Cephalosporin use.     ROS  Constitutional: Negative for fever or weight change.  Respiratory:  Negative for cough and shortness of breath.   Cardiovascular: Negative for chest pain or palpitations.  Gastrointestinal: Negative for abdominal pain, no bowel changes.  Musculoskeletal: Negative for gait problem or joint swelling.  Skin: Negative for rash.  Neurological: Negative for dizziness or headache.  No other specific complaints in a complete review of systems (except as listed in HPI above).   Objective  Vitals:   07/29/24 1017 07/29/24 1035  BP: (!) 162/90 (!) 150/88  Pulse: 81   Resp: 16   SpO2: 98%   Weight: 186 lb 9.6 oz (84.6 kg)   Height: 5' 2 (1.575 m)     Body mass index is 34.13 kg/m.  Physical Exam Vitals reviewed.  Constitutional:      Appearance: Normal appearance.  HENT:     Head: Normocephalic.     Right Ear: Tympanic membrane normal.     Left Ear: Tympanic membrane normal.     Nose: Nose normal.  Eyes:     Extraocular Movements: Extraocular movements intact.     Conjunctiva/sclera: Conjunctivae normal.  Pupils: Pupils are equal, round, and reactive to light.  Neck:     Thyroid : No thyroid  mass, thyromegaly or thyroid  tenderness.  Cardiovascular:     Rate and Rhythm: Normal rate and regular rhythm.     Pulses: Normal pulses.     Heart sounds: Normal heart sounds.  Pulmonary:     Effort: Pulmonary effort is normal.     Breath sounds: Normal breath sounds.  Abdominal:     General: Bowel sounds are normal.     Palpations: Abdomen is soft.  Musculoskeletal:        General: Normal range of motion.     Cervical back: Normal range of motion and neck supple.     Right lower leg: No edema.     Left lower leg: No edema.  Skin:    General: Skin is warm and dry.     Capillary Refill: Capillary refill takes less than 2 seconds.  Neurological:     General: No focal deficit present.     Mental Status: She is alert and oriented to person, place, and time. Mental status is at baseline.  Psychiatric:        Mood and Affect: Mood normal.         Behavior: Behavior normal.        Thought Content: Thought content normal.        Judgment: Judgment normal.      Recent Results (from the past 2160 hours)  HM DIABETES EYE EXAM     Status: None   Collection Time: 05/20/24 11:39 AM  Result Value Ref Range   HM Diabetic Eye Exam No Retinopathy No Retinopathy    Comment: ABS BY HIM  Lipid panel     Status: None   Collection Time: 07/01/24  3:09 PM  Result Value Ref Range   Cholesterol 147 0 - 200 mg/dL   Triglycerides 876 <849 mg/dL   HDL 48 >59 mg/dL   Total CHOL/HDL Ratio 3.1 RATIO   VLDL 25 0 - 40 mg/dL   LDL Cholesterol 74 0 - 99 mg/dL    Comment:        Total Cholesterol/HDL:CHD Risk Coronary Heart Disease Risk Table                     Men   Women  1/2 Average Risk   3.4   3.3  Average Risk       5.0   4.4  2 X Average Risk   9.6   7.1  3 X Average Risk  23.4   11.0        Use the calculated Patient Ratio above and the CHD Risk Table to determine the patient's CHD Risk.        ATP III CLASSIFICATION (LDL):  <100     mg/dL   Optimal  899-870  mg/dL   Near or Above                    Optimal  130-159  mg/dL   Borderline  839-810  mg/dL   High  >809     mg/dL   Very High Performed at [facility], [address] [place], [place] [address]   Basic Metabolic Panel (BMET)     Status: Abnormal   Collection Time: 07/01/24  3:09 PM  Result Value Ref Range   Sodium 142 135 - 145 mmol/L   Potassium 4.4 3.5 - 5.1 mmol/L  Chloride 104 98 - 111 mmol/L   CO2 26 22 - 32 mmol/L   Glucose, Bld 136 (H) 70 - 99 mg/dL    Comment: Glucose reference range applies only to samples taken after fasting for at least 8 hours.   BUN 16 8 - 23 mg/dL   Creatinine, Ser 9.05 0.44 - 1.00 mg/dL   Calcium  9.6 8.9 - 10.3 mg/dL   GFR, Estimated >39 >39 mL/min    Comment: (NOTE) Calculated using the CKD-EPI Creatinine Equation (2021)    Anion gap 12 5 - 15    Comment: Performed at [facility], [address] [place],  [place] [address]  B Nat Peptide     Status: None   Collection Time: 07/01/24  3:09 PM  Result Value Ref Range   B Natriuretic Peptide 11.8 0.0 - 100.0 pg/mL    Comment: Performed at [facility], [address] [place], [place] [address]  Urine Microalbumin w/creat. ratio     Status: Abnormal   Collection Time: 07/07/24 11:43 AM  Result Value Ref Range   Creatinine, Urine 57 20 - 275 mg/dL   Microalb, Ur 6.6 mg/dL    Comment: Reference Range Not established    Microalb Creat Ratio 116 (H) <30 mg/g creat    Comment: . The ADA defines abnormalities in albumin excretion as follows: SABRA Albuminuria Category        Result (mg/g creatinine) . Normal to Mildly increased   <30 Moderately increased         30-299  Severely increased           > OR = 300 . The ADA recommends that at least two of three specimens collected within a 3-6 month period be abnormal before considering a patient to be within a diagnostic category.   CBC with Differential/Platelet     Status: Abnormal   Collection Time: 07/07/24 11:43 AM  Result Value Ref Range   WBC 4.6 3.8 - 10.8 Thousand/uL   RBC 4.60 3.80 - 5.10 Million/uL   Hemoglobin 11.4 (L) 11.7 - 15.5 g/dL   HCT 61.5 64.9 - 54.9 %   MCV 83.5 80.0 - 100.0 fL   MCH 24.8 (L) 27.0 - 33.0 pg   MCHC 29.7 (L) 32.0 - 36.0 g/dL    Comment: For adults, a slight decrease in the calculated MCHC value (in the range of 30 to 32 g/dL) is most likely not clinically significant; however, it should be interpreted with caution in correlation with other red cell parameters and the patient's clinical condition.    RDW 16.5 (H) 11.0 - 15.0 %   Platelets 316 140 - 400 Thousand/uL   MPV 11.6 7.5 - 12.5 fL   Neutro Abs 2,254 1,500 - 7,800 cells/uL   Absolute Lymphocytes 1,601 850 - 3,900 cells/uL   Absolute Monocytes 340 200 - 950 cells/uL   Eosinophils Absolute 373 15 - 500 cells/uL   Basophils Absolute 32 0 - 200 cells/uL   Neutrophils Relative % 49 %   Total  Lymphocyte 34.8 %   Monocytes Relative 7.4 %   Eosinophils Relative 8.1 %   Basophils Relative 0.7 %  TSH     Status: None   Collection Time: 07/07/24 11:43 AM  Result Value Ref Range   TSH 0.61 0.40 - 4.50 mIU/L  Iron , TIBC and Ferritin Panel     Status: Abnormal   Collection Time: 07/07/24 11:43 AM  Result Value Ref Range   Iron   36 (L) 45 - 160 mcg/dL   TIBC 611 749 - 549 mcg/dL (calc)   %SAT 9 (L) 16 - 45 % (calc)   Ferritin 4 (L) 16 - 288 ng/mL  Hemoglobin A1c     Status: Abnormal   Collection Time: 07/07/24 11:43 AM  Result Value Ref Range   Hgb A1c MFr Bld 6.8 (H) <5.7 %    Comment: For someone without known diabetes, a hemoglobin A1c value of 6.5% or greater indicates that they may have  diabetes and this should be confirmed with a follow-up  test. . For someone with known diabetes, a value <7% indicates  that their diabetes is well controlled and a value  greater than or equal to 7% indicates suboptimal  control. A1c targets should be individualized based on  duration of diabetes, age, comorbid conditions, and  other considerations. . Currently, no consensus exists regarding use of hemoglobin A1c for diagnosis of diabetes for children. .    Mean Plasma Glucose 148 mg/dL   eAG (mmol/L) 8.2 mmol/L  Basic Metabolic Panel (BMET)     Status: Abnormal   Collection Time: 07/13/24  9:56 AM  Result Value Ref Range   Sodium 139 135 - 145 mmol/L   Potassium 4.1 3.5 - 5.1 mmol/L   Chloride 109 98 - 111 mmol/L   CO2 23 22 - 32 mmol/L   Glucose, Bld 139 (H) 70 - 99 mg/dL    Comment: Glucose reference range applies only to samples taken after fasting for at least 8 hours.   BUN 11 8 - 23 mg/dL   Creatinine, Ser 9.08 0.44 - 1.00 mg/dL   Calcium  9.4 8.9 - 10.3 mg/dL   GFR, Estimated >39 >39 mL/min    Comment: (NOTE) Calculated using the CKD-EPI Creatinine Equation (2021)    Anion gap 7 5 - 15    Comment: Performed at [facility], [address], [place],  [place] [address]      Fall Risk:    07/29/2024   10:11 AM 07/07/2024   10:43 AM 06/01/2024    9:53 AM 03/02/2024    9:44 AM 11/27/2023    1:49 PM  Fall Risk   Falls in the past year? 0 0 0 0 0  Number falls in past yr: 0 0 0 0   Injury with Fall? 0 0 0 0   Risk for fall due to : No Fall Risks  No Fall Risks  No Fall Risks  Follow up Falls evaluation completed Falls evaluation completed Falls evaluation completed Falls evaluation completed Falls prevention discussed     Functional Status Survey: Is the patient deaf or have difficulty hearing?: No Does the patient have difficulty seeing, even when wearing glasses/contacts?: No Does the patient have difficulty concentrating, remembering, or making decisions?: No Does the patient have difficulty walking or climbing stairs?: No Does the patient have difficulty dressing or bathing?: No Does the patient have difficulty doing errands alone such as visiting a doctor's office or shopping?: No   Assessment & Plan  Problem List Items Addressed This Visit       Cardiovascular and Mediastinum   Essential hypertension, benign (Chronic)   Other Visit Diagnoses       Annual physical exam    -  Primary   Relevant Orders   MM 3D SCREENING MAMMOGRAM BILATERAL BREAST     Encounter for screening mammogram for malignant neoplasm of breast       Relevant Orders  MM 3D SCREENING MAMMOGRAM BILATERAL BREAST     Encounter for osteoporosis screening in asymptomatic postmenopausal patient       Relevant Orders   DG Bone Density      Assessment and Plan Assessment & Plan Annual exam She reports lack of appetite and minimal food intake, primarily consuming watermelon. Engages in physical activity by caring for her grandchildren and cleaning. Reports increased sleep and fatigue but denies snoring. Attends regular dental and eye exams. Denies violence at home, sexual activity, and incontinence. - Order DEXA scan and mammogram - Request records of 2022  colonoscopy  Hypertension Blood pressure is elevated at 186 mmHg, possibly due to excitement during the visit. She forgot to take her blood pressure medication last night. - Monitor blood pressure after physical activity    -USPSTF grade A and B recommendations reviewed with patient; age-appropriate recommendations, preventive care, screening tests, etc discussed and encouraged; healthy living encouraged; see AVS for patient education given to patient -Discussed importance of 150 minutes of physical activity weekly, eat two servings of fish weekly, eat one serving of tree nuts ( cashews, pistachios, pecans, almonds.SABRA) every other day, eat 6 servings of fruit/vegetables daily and drink plenty of water and avoid sweet beverages.   -Reviewed Health Maintenance: yes

## 2024-07-30 ENCOUNTER — Encounter: Admitting: Nurse Practitioner

## 2024-08-27 ENCOUNTER — Ambulatory Visit: Payer: Medicare Other

## 2024-08-27 DIAGNOSIS — Z0000 Encounter for general adult medical examination without abnormal findings: Secondary | ICD-10-CM | POA: Diagnosis not present

## 2024-08-27 NOTE — Progress Notes (Signed)
 Subjective:   Dawn Rice is a 68 y.o. who presents for a Medicare Wellness preventive visit.  As a reminder, Annual Wellness Visits don't include a physical exam, and some assessments may be limited, especially if this visit is performed virtually. We may recommend an in-person follow-up visit with your provider if needed.  Visit Complete: Virtual I connected with  Dawn Rice on 08/27/24 by a audio enabled telemedicine application and verified that I am speaking with the correct person using two identifiers.  Patient Location: Home  Provider Location: Home Office  I discussed the limitations of evaluation and management by telemedicine. The patient expressed understanding and agreed to proceed.  Vital Signs: Because this visit was a virtual/telehealth visit, some criteria may be missing or patient reported. Any vitals not documented were not able to be obtained and vitals that have been documented are patient reported.  VideoDeclined- This patient declined Librarian, academic. Therefore the visit was completed with audio only.  Persons Participating in Visit: Patient.  AWV Questionnaire: No: Patient Medicare AWV questionnaire was not completed prior to this visit.  Cardiac Risk Factors include: advanced age (>37men, >71 women);diabetes mellitus;dyslipidemia;hypertension;obesity (BMI >30kg/m2);sedentary lifestyle     Objective:    There were no vitals filed for this visit. There is no height or weight on file to calculate BMI.     08/27/2024   10:22 AM 02/06/2024    2:19 AM 08/22/2023   10:29 AM 03/29/2023    9:25 AM 08/03/2021    5:42 PM 01/18/2020    2:06 PM 03/04/2018   11:45 AM  Advanced Directives  Does Patient Have a Medical Advance Directive? No No No No No No No   Would patient like information on creating a medical advance directive? No - Patient declined   No - Patient declined  No - Patient declined No - Patient declined       Data saved with a previous flowsheet row definition    Current Medications (verified) Outpatient Encounter Medications as of 08/27/2024  Medication Sig   Aspirin -Caffeine (BC FAST PAIN RELIEF PO) Take 1 packet by mouth daily as needed (pain).   carvedilol  (COREG ) 25 MG tablet Take 1 tablet (25 mg total) by mouth 2 (two) times daily with a meal.   cetirizine  (ZYRTEC ) 10 MG tablet Take 1 tablet (10 mg total) by mouth daily.   empagliflozin  (JARDIANCE ) 10 MG TABS tablet Take 1 tablet (10 mg total) by mouth daily before breakfast.   famotidine  (PEPCID ) 20 MG tablet Take 1 tablet (20 mg total) by mouth 2 (two) times daily.   glucose blood test strip 1 each by Other route as needed for other. Use as instructed   hydrocortisone  1 % ointment Apply 1 Application topically 2 (two) times daily.   Iron , Ferrous Sulfate , 325 (65 Fe) MG TABS Take 325 mg by mouth daily.   Ketotifen Fumarate (ALLERGY  EYE DROPS OP) Place 1 drop into both eyes daily as needed (allergies).   rosuvastatin  (CRESTOR ) 20 MG tablet Take 1 tablet (20 mg total) by mouth daily.   Semaglutide , 1 MG/DOSE, 4 MG/3ML SOPN Inject 1 mg as directed once a week.   spironolactone  (ALDACTONE ) 25 MG tablet Take 1 tablet (25 mg total) by mouth daily.   triamcinolone  cream (KENALOG ) 0.1 % Apply 1 Application topically 2 (two) times daily.   No facility-administered encounter medications on file as of 08/27/2024.    Allergies (verified) Contrave [naltrexone-bupropion hcl er], Metformin  and related, and  Penicillins   History: Past Medical History:  Diagnosis Date   Cataract    Epilepsy (HCC)    Gout    History of blood transfusion    when I got my hysterectomy (07/22/2017)   Hyperlipidemia    Hypertension    Seizures (HCC)    from epilepsy; the kind where I space out; last one was ~ 1 yr ago (07/22/2017)   Type II diabetes mellitus (HCC)    Past Surgical History:  Procedure Laterality Date   ABDOMINAL HYSTERECTOMY     APPENDECTOMY      CATARACT EXTRACTION     CATARACT EXTRACTION W/ INTRAOCULAR LENS  IMPLANT, BILATERAL Bilateral 2016-2017   right-left   CHOLECYSTECTOMY  03/2009   lap/notes 04/25/2011   LAPAROSCOPIC APPENDECTOMY N/A 07/21/2017   Procedure: APPENDECTOMY LAPAROSCOPIC;  Surgeon: Belinda Cough, MD;  Location: [facility] OR;  Service: General;  Laterality: N/A;   RIGHT/LEFT HEART CATH AND CORONARY ANGIOGRAPHY N/A 03/29/2023   Procedure: RIGHT/LEFT HEART CATH AND CORONARY ANGIOGRAPHY;  Surgeon: Rolan Ezra RAMAN, MD;  Location: [facility] INVASIVE CV [facility];  Service: Cardiovascular;  Laterality: N/A;   TUBAL LIGATION     Family History  Problem Relation Age of Onset   Diabetes Mother    Hypertension Mother    Heart failure Mother    Cataracts Mother    Diabetes Father    Coronary artery disease Father    Retinitis pigmentosa Sister    Amblyopia Neg Hx    Blindness Neg Hx    Glaucoma Neg Hx    Macular degeneration Neg Hx    Retinal detachment Neg Hx    Strabismus Neg Hx    Social History   Socioeconomic History   Marital status: Widowed    Spouse name: Not on file   Number of children: 4   Years of education: Not on file   Highest education level: Not on file  Occupational History   Not on file  Tobacco Use   Smoking status: Former    Current packs/day: 0.00    Average packs/day: 0.5 packs/day for 20.0 years (10.0 ttl pk-yrs)    Types: Cigarettes    Start date: 58    Quit date: 2009    Years since quitting: 16.6   Smokeless tobacco: Never  Vaping Use   Vaping status: Never Used  Substance and Sexual Activity   Alcohol use: Yes    Comment: 07/22/2017 glass of wine maybe once/month   Drug use: No   Sexual activity: Not Currently  Other Topics Concern   Not on file  Social History Narrative   Widowed mother of 4.   Quit smoking in 2008.   Occasional alcohol.   Social Drivers of Corporate investment banker Strain: Low Risk  (08/27/2024)   Overall Financial Resource Strain (CARDIA)    Difficulty of  Paying Living Expenses: Not hard at all  Food Insecurity: No Food Insecurity (08/27/2024)   Hunger Vital Sign    Worried About Running Out of Food in the Last Year: Never true    Ran Out of Food in the Last Year: Never true  Transportation Needs: No Transportation Needs (08/27/2024)   PRAPARE - Administrator, Civil Service (Medical): No    Lack of Transportation (Non-Medical): No  Physical Activity: Insufficiently Active (08/27/2024)   Exercise Vital Sign    Days of Exercise per Week: 3 days    Minutes of Exercise per Session: 20 min  Stress: No Stress Concern Present (  08/27/2024)   [employer] [employer] - Occupational Stress Questionnaire    Feeling of Stress: Only a little  Social Connections: Moderately Isolated (08/27/2024)   Social Connection and Isolation Panel    Frequency of Communication with Friends and Family: More than three times a week    Frequency of Social Gatherings with Friends and Family: Once a week    Attends Religious Services: More than 4 times per year    Active Member of [employer] or Organizations: No    Attends Banker Meetings: Never    Marital Status: Widowed    Tobacco Counseling Counseling given: Not Answered    Clinical Intake:  Pre-visit preparation completed: Yes  Pain : No/denies pain     BMI - recorded: 34 Nutritional Status: BMI > 30  Obese Nutritional Risks: None Diabetes: Yes CBG done?: No Did pt. bring in CBG monitor from home?: No  Lab Results  Component Value Date   HGBA1C 6.8 (H) 07/07/2024   HGBA1C 6.8 (H) 03/02/2024   HGBA1C 7.2 (A) 11/27/2023     How often do you need to have someone help you when you read instructions, pamphlets, or other written materials from your doctor or pharmacy?: 1 - Never  Interpreter Needed?: No  Information entered by :: JHONNIE DAS, LPN   Activities of Daily Living    08/27/2024   10:23 AM 07/29/2024   10:11 AM  In your present state of health, do you  have any difficulty performing the following activities:  Hearing? 0 0  Vision? 0 0  Difficulty concentrating or making decisions? 0 0  Walking or climbing stairs? 1 0  Comment KNEE PAIN   Dressing or bathing? 0 0  Doing errands, shopping? 0 0  Preparing Food and eating ? N   Using the Toilet? N   In the past six months, have you accidently leaked urine? N   Do you have problems with loss of bowel control? N   Managing your Medications? N   Managing your Finances? N   Housekeeping or managing your Housekeeping? N     Patient Care Team: Gareth Mliss FALCON, FNP as PCP - General (Nurse Practitioner) Tobb, Kardie, DO as PCP - Cardiology (Cardiology) Alana, Sharyle LABOR, RPH-CPP as Pharmacist [facility]  I have updated your Care Teams any recent Medical Services you may have received from other providers in the past year.     Assessment:   This is a routine wellness examination for [place].  Hearing/Vision screen Hearing Screening - Comments:: NO AIDS Vision Screening - Comments:: WEARS GLASSES- DR.GROAT   Goals Addressed             This Visit's Progress    DIET - EAT MORE FRUITS AND VEGETABLES         Depression Screen     08/27/2024   10:19 AM 07/29/2024   10:11 AM 11/27/2023    1:49 PM 08/22/2023   10:23 AM 07/25/2023    2:02 PM 06/25/2023    9:59 AM  PHQ 2/9 Scores  PHQ - 2 Score 0 0 0 0 0 0  PHQ- 9 Score 0  0       Fall Risk     08/27/2024   10:23 AM 07/29/2024   10:11 AM 07/07/2024   10:43 AM 06/01/2024    9:53 AM 03/02/2024    9:44 AM  Fall Risk   Falls in the past year? 0 0 0 0 0  Number falls in past yr: 0 0 0 0 0  Injury with Fall? 0 0 0 0 0  Risk for fall due to : No Fall Risks No Fall Risks  No Fall Risks   Follow up Falls evaluation completed;Falls prevention discussed Falls evaluation completed Falls evaluation completed Falls evaluation completed Falls evaluation completed    MEDICARE RISK AT HOME:  Medicare Risk at Home Any stairs in  or around the home?: Yes If so, are there any without handrails?: No Home free of loose throw rugs in walkways, pet beds, electrical cords, etc?: Yes Adequate lighting in your home to reduce risk of falls?: Yes Life alert?: No Use of a cane, walker or w/c?: No Grab bars in the bathroom?: No Shower chair or bench in shower?: No Elevated toilet seat or a handicapped toilet?: No  TIMED UP AND GO:  Was the test performed?  No  Cognitive Function: 6CIT completed        08/27/2024   10:25 AM 08/22/2023   10:33 AM  6CIT Screen  What Year? 0 points 0 points  What month? 0 points 0 points  What time? 0 points 0 points  Count back from 20 0 points 0 points  Months in reverse 2 points 0 points  Repeat phrase 4 points 0 points  Total Score 6 points 0 points    Immunizations Immunization History  Administered Date(s) Administered   Dtap, Unspecified 04/29/1959, 07/15/1959, 11/11/1959, 08/03/1965   Influenza, Seasonal, Injecte, Preservative Fre 11/27/2023   Influenza,inj,Quad PF,6+ Mos 10/24/2021   Measles 03/10/1971   PFIZER(Purple Top)SARS-COV-2 Vaccination 03/26/2020, 04/16/2020, 05/02/2020   Polio, Unspecified 04/29/1959, 07/15/1959, 07/13/1960, 08/31/1965, 11/01/1966   Rubella 03/10/1971   Smallpox 03/09/1965   Td (Adult),unspecified 02/03/1970    Screening Tests Health Maintenance  Topic Date Due   Fecal DNA (Cologuard)  Never done   DEXA SCAN  Never done   INFLUENZA VACCINE  07/24/2024   COVID-19 Vaccine (4 - 2025-26 season) 08/24/2024   Zoster Vaccines- Shingrix (1 of 2) 10/07/2024 (Originally 11/29/1977)   DTaP/Tdap/Td (5 - Tdap) 03/02/2025 (Originally 02/04/1970)   Pneumococcal Vaccine: 50+ Years (1 of 2 - PCV) 07/29/2025 (Originally 11/29/1977)   MAMMOGRAM  09/09/2024   HEMOGLOBIN A1C  01/07/2025   OPHTHALMOLOGY EXAM  05/20/2025   Diabetic kidney evaluation - Urine ACR  07/07/2025   FOOT EXAM  07/07/2025   Diabetic kidney evaluation - eGFR measurement  07/13/2025    Medicare Annual Wellness (AWV)  08/27/2025   Hepatitis C Screening  Completed   HIV Screening  Completed   Hepatitis B Vaccines 19-59 Average Risk  Aged Out   HPV VACCINES  Aged Out   Meningococcal B Vaccine  Aged Out    Health Maintenance  Health Maintenance Due  Topic Date Due   Fecal DNA (Cologuard)  Never done   DEXA SCAN  Never done   INFLUENZA VACCINE  07/24/2024   COVID-19 Vaccine (4 - 2025-26 season) 08/24/2024   Health Maintenance Items Addressed: NEEDS TDAP, PNA, COVID;HAS APPT FOR MAMMOGRAM; ORDER PLACED FOR DEXA IN AUG; UP TO DATE ON COLONOSCOPY PER PATIENT IN [place]  Additional Screening:  Vision Screening: Recommended annual ophthalmology exams for early detection of glaucoma and other disorders of the eye. Would you like a referral to an eye doctor? No    Dental Screening: Recommended annual dental exams for proper oral hygiene  Community Resource Referral / Chronic Care Management: CRR required this visit?  No   CCM required this visit?  No   Plan:    I have personally reviewed and noted the following in the patient's chart:   Medical and social history Use of alcohol, tobacco or illicit drugs  Current medications and supplements including opioid prescriptions. Patient is not currently taking opioid prescriptions. Functional ability and status Nutritional status Physical activity Advanced directives List of other physicians Hospitalizations, surgeries, and ER visits in previous 12 months Vitals Screenings to include cognitive, depression, and falls Referrals and appointments  In addition, I have reviewed and discussed with patient certain preventive protocols, quality metrics, and best practice recommendations. A written personalized care plan for preventive services as well as general preventive health recommendations were provided to patient.   Jhonnie GORMAN Das, LPN   0/[id number]   After Visit Summary: (MyChart) Due to this being a telephonic  visit, the after visit summary with patients personalized plan was offered to patient via MyChart   Notes: Nothing significant to report at this time.

## 2024-08-27 NOTE — Patient Instructions (Signed)
 Dawn Rice , Thank you for taking time out of your busy schedule to complete your Annual Wellness Visit with me. I enjoyed our conversation and look forward to speaking with you again next year. I, as well as your care team,  appreciate your ongoing commitment to your health goals. Please review the following plan we discussed and let me know if I can assist you in the future.   Follow up Visits: 09/02/25 @ 10:10 AM BY PHONE We will see or speak with you next year for your Next Medicare AWV with our clinical staff Have you seen your provider in the last 6 months (3 months if uncontrolled diabetes)? Yes  Clinician Recommendations:  Aim for 30 minutes of exercise or brisk walking, 6-8 glasses of water, and 5 servings of fruits and vegetables each day. TAKE CARE!      This is a list of the screenings recommended for you:  Health Maintenance  Topic Date Due   Cologuard (Stool DNA test)  Never done   DEXA scan (bone density measurement)  Never done   Flu Shot  07/24/2024   COVID-19 Vaccine (4 - 2025-26 season) 08/24/2024   Zoster (Shingles) Vaccine (1 of 2) 10/07/2024*   DTaP/Tdap/Td vaccine (5 - Tdap) 03/02/2025*   Pneumococcal Vaccine for age over 52 (1 of 2 - PCV) 07/29/2025*   Mammogram  09/09/2024   Hemoglobin A1C  01/07/2025   Eye exam for diabetics  05/20/2025   Yearly kidney health urinalysis for diabetes  07/07/2025   Complete foot exam   07/07/2025   Yearly kidney function blood test for diabetes  07/13/2025   Medicare Annual Wellness Visit  08/27/2025   Hepatitis C Screening  Completed   HIV Screening  Completed   Hepatitis B Vaccine  Aged Out   HPV Vaccine  Aged Out   Meningitis B Vaccine  Aged Out  *Topic was postponed. The date shown is not the original due date.    Advanced directives: (ACP Link)Information on Advanced Care Planning can be found at [place]  Secretary of [facility] Advance Health Care Directives Advance Health Care Directives. [email or web address]  Advance Care  Planning is important because it:  [x]  Makes sure you receive the medical care that is consistent with your values, goals, and preferences  [x]  It provides guidance to your family and loved ones and reduces their decisional burden about whether or not they are making the right decisions based on your wishes.  Follow the link provided in your after visit summary or read over the paperwork we have mailed to you to help you started getting your Advance Directives in place. If you need assistance in completing these, please reach out to us  so that we can help you!

## 2024-09-09 ENCOUNTER — Other Ambulatory Visit: Admitting: Pharmacist

## 2024-09-22 ENCOUNTER — Telehealth: Payer: Self-pay | Admitting: Nurse Practitioner

## 2024-09-22 ENCOUNTER — Ambulatory Visit

## 2024-09-22 NOTE — Telephone Encounter (Signed)
 Copied from CRM [id number]. Topic: Appointments - Appointment Cancel/Reschedule >> Sep 22, 2024  9:09 AM Dawn Rice wrote: Patient/patient representative is calling to cancel or reschedule an appointment. Refer to attachments for appointment information. The appt is with the pharmacist

## 2024-09-22 NOTE — Telephone Encounter (Signed)
 Appt resch for oct 7 at 1pm. Will you check to see if I did it correctly.

## 2024-09-22 NOTE — Progress Notes (Deleted)
 S:     No chief complaint on file.   Reason for visit: ?  Dawn Rice is a 68 y.o. female with a history of diabetes (type 2), who presents today for a follow up diabetes pharmacotherapy visit.? Pertinent PMH also includes HLD, gout, seizures, obesity, CAD, HTN and HFpEF.   Known DM Complications: {DM complications:33329}   Care Team: Primary Care Provider: Gareth Mliss FALCON, FNP  At last visit, ***.   Patient reports Diabetes was diagnosed in ***.   Current diabetes medications include: Ozempic  1 mg weekly, Jardiance  10 mg daily  Previous diabetes medications include: Rybelsus  (cost), metformin  (nausea), Janumet  (***) Current hypertension medications include: carvedilol  25 mg BID, spironolactone  25 mg daily  Current hyperlipidemia medications include: rosuvastatin  20 mg daily   Patient reports adherence to taking all medications as prescribed.  *** Patient denies adherence with medications, reports missing *** medications *** times per week, on average.  Have you been experiencing any side effects to the medications prescribed? {YES NO:22349} Do you have any problems obtaining medications due to transportation or finances? {YES NO:22349} Insurance coverage: [facility] Medicare  Patient {Actions; denies-reports:120008} hypoglycemic events.  Reported home fasting blood sugars: ***  Reported 2 hour post-meal/random blood sugars: ***.  Patient {Actions; denies-reports:120008} nocturia (nighttime urination).  Patient {Actions; denies-reports:120008} neuropathy (nerve pain). Patient {Actions; denies-reports:120008} visual changes. Patient {Actions; denies-reports:120008} self foot exams.   Patient reported dietary habits: Eats *** meals/day Breakfast: *** Lunch: *** Dinner: *** Snacks: *** Drinks: ***  Patient-reported exercise habits: *** DM Prevention:  Statin: Taking; high intensity.?  History of albuminuria? yes, last UACR on 07/07/24 = 116 mg/g Last eye exam: ***;  {lzdmeyeexam:31121} Lab Results  Component Value Date   HMDIABEYEEXA No Retinopathy 05/20/2024   Tobacco Use:  Tobacco Use: Medium Risk (08/27/2024)   Patient History    Smoking Tobacco Use: Former    Smokeless Tobacco Use: Never    Passive Exposure: Not on file   O:  Vitals:  Wt Readings from Last 3 Encounters:  07/29/24 186 lb 9.6 oz (84.6 kg)  07/07/24 189 lb 3.2 oz (85.8 kg)  07/01/24 187 lb 12.8 oz (85.2 kg)   BP Readings from Last 3 Encounters:  07/29/24 (!) 150/88  07/07/24 122/78  07/01/24 (!) 140/80   Pulse Readings from Last 3 Encounters:  07/29/24 81  07/07/24 90  07/01/24 84     Labs:?  Lab Results  Component Value Date   HGBA1C 6.8 (H) 07/07/2024   HGBA1C 6.8 (H) 03/02/2024   HGBA1C 7.2 (A) 11/27/2023   GLUCOSE 139 (H) 07/13/2024   MICRALBCREAT 116 (H) 07/07/2024   MICRALBCREAT 240 (H) 06/25/2023   CREATININE 0.91 07/13/2024   CREATININE 0.94 07/01/2024   CREATININE 1.02 (H) 03/17/2024    Lab Results  Component Value Date   CHOL 147 07/01/2024   LDLCALC 74 07/01/2024   LDLCALC 79 06/25/2023   LDLCALC 69 03/14/2023   HDL 48 07/01/2024   TRIG 123 07/01/2024   TRIG 112 06/25/2023   TRIG 110 03/14/2023   ALT 10 03/02/2024   ALT 13 06/25/2023   AST 18 03/02/2024   AST 17 06/25/2023      Chemistry      Component Value Date/Time   NA 139 07/13/2024 0956   NA 143 11/22/2022 1010   K 4.1 07/13/2024 0956   CL 109 07/13/2024 0956   CO2 23 07/13/2024 0956   BUN 11 07/13/2024 0956   BUN 14 11/22/2022 1010   CREATININE  0.91 07/13/2024 0956   CREATININE 0.83 03/02/2024 1043      Component Value Date/Time   CALCIUM  9.4 07/13/2024 0956   ALKPHOS 60 08/03/2021 1748   AST 18 03/02/2024 1043   ALT 10 03/02/2024 1043   BILITOT 0.3 03/02/2024 1043       The 10-year ASCVD risk score (Arnett DK, et al., 2019) is: 27.8%  Lab Results  Component Value Date   MICRALBCREAT 116 (H) 07/07/2024   MICRALBCREAT 240 (H) 06/25/2023    A/P: Diabetes  currently controlled with a most recent A1c of 6.8% on 07/07/24. Patient is *** able to verbalize appropriate hypoglycemia management plan. Medication adherence appears ***. Control is suboptimal due to ***. -{Meds adjust:18428} GLP-1 {GLP1 options:33572} *** mg .  -{Meds adjust:18428} SGLT2-I {SGLT2i options:33571}*** mg daily.  -{Meds adjust:18428} metformin  ***.  -Patient educated on purpose, proper use, and potential adverse effects of ***.  -Extensively discussed pathophysiology of diabetes, recommended lifestyle interventions, dietary effects on blood sugar control.  -Counseled on s/sx of and management of hypoglycemia.  -Next A1c anticipated ***.   ASCVD risk - secondary prevention in patient with diabetes. Last LDL is 74 mg/dL, not at goal of <44 mg/dL.  -Continued rosuvastatin  20 mg daily.   Hypertension longstanding *** currently ***. Blood pressure goal of <130/80 *** mmHg. Medication adherence ***. Blood pressure control is suboptimal due to ***. -{Meds adjust:18428} *** mg ***.  {pharmacisttime:33368}  Follow-up:  Pharmacist on *** PCP clinic visit on ***  Peyton CHARLENA Ferries, PharmD Clinical Pharmacist [facility] [phone]

## 2024-09-23 NOTE — Telephone Encounter (Signed)
 In person

## 2024-09-28 ENCOUNTER — Telehealth: Payer: Self-pay | Admitting: Nurse Practitioner

## 2024-09-28 NOTE — Telephone Encounter (Unsigned)
 Copied from CRM #[id number]. Topic: Appointments - Scheduling Inquiry for Clinic >> Sep 28, 2024  5:28 PM Donee H wrote: Reason for CRM: Patient called wanting to see if it's possible to change appointment for tomorrow at 1:00pm with Pharmacist to a phone appointment instead of in person. Patient stated having issues with transportation. Please follow up with request.

## 2024-09-29 ENCOUNTER — Other Ambulatory Visit

## 2024-09-29 ENCOUNTER — Other Ambulatory Visit (INDEPENDENT_AMBULATORY_CARE_PROVIDER_SITE_OTHER)

## 2024-09-29 DIAGNOSIS — E785 Hyperlipidemia, unspecified: Secondary | ICD-10-CM

## 2024-09-29 DIAGNOSIS — Z7985 Long-term (current) use of injectable non-insulin antidiabetic drugs: Secondary | ICD-10-CM

## 2024-09-29 DIAGNOSIS — E1165 Type 2 diabetes mellitus with hyperglycemia: Secondary | ICD-10-CM

## 2024-09-29 DIAGNOSIS — Z7984 Long term (current) use of oral hypoglycemic drugs: Secondary | ICD-10-CM

## 2024-09-29 MED ORDER — ROSUVASTATIN CALCIUM 20 MG PO TABS: 20.0000 mg | ORAL_TABLET | Freq: Every day | ORAL | 3 refills | Status: AC

## 2024-09-29 MED ORDER — BLOOD GLUCOSE MONITORING SUPPL DEVI: 0 refills | Status: AC

## 2024-09-29 MED ORDER — LANCET DEVICE MISC: 0 refills | Status: AC

## 2024-09-29 MED ORDER — BLOOD GLUCOSE TEST VI STRP: ORAL_STRIP | 3 refills | Status: AC

## 2024-09-29 MED ORDER — LANCETS MISC. MISC: 3 refills | Status: DC

## 2024-09-29 NOTE — Progress Notes (Signed)
 S:     Chief Complaint  Patient presents with   Medication Management    Reason for visit: ?  Dawn Rice is a 68 y.o. female with a history of diabetes (type 2), who presents today for a follow up diabetes pharmacotherapy visit.? Pertinent PMH also includes HLD, gout, seizures, obesity, CAD, HTN and HFpEF.   Care Team: Primary Care Provider: Gareth Mliss FALCON, FNP  At last visit with PCP on 07/29/24, BP was elevated at 150/88 mmHg. She reported not taking her BP medications the night prior to that appointment.   Current diabetes medications include: Ozempic  1 mg weekly, Jardiance  10 mg daily  Previous diabetes medications include: Rybelsus  (cost), metformin  (nausea), Janumet   Current hypertension medications include: carvedilol  25 mg BID, spironolactone  25 mg daily  Current hyperlipidemia medications include: rosuvastatin  20 mg daily   Patient reports adherence to all of her PM medications. Reports missing doses of her AM medications a couple times per week (carvedilol  and Jardiance )  Have you been experiencing any side effects to the medications prescribed? no Do you have any problems obtaining medications due to transportation or finances? no Insurance coverage: [facility] Medicare  Patient denies hypoglycemic events.  Reported home fasting blood sugars: 90-100s   Patient-reported exercise habits: stays busy with her great-grandchildren  Hypertension: Patient has a home BP cuff Current blood pressure readings readings: not checking   DM Prevention:  Statin: Taking; high intensity.?  History of albuminuria? yes, last UACR on 07/07/24 = 116 mg/g Last eye exam:  Lab Results  Component Value Date   HMDIABEYEEXA No Retinopathy 05/20/2024   Tobacco Use:  Tobacco Use: Medium Risk (08/27/2024)   Patient History    Smoking Tobacco Use: Former    Smokeless Tobacco Use: Never    Passive Exposure: Not on file   O:  Vitals:  Wt Readings from Last 3 Encounters:  07/29/24 186  lb 9.6 oz (84.6 kg)  07/07/24 189 lb 3.2 oz (85.8 kg)  07/01/24 187 lb 12.8 oz (85.2 kg)   BP Readings from Last 3 Encounters:  07/29/24 (!) 150/88  07/07/24 122/78  07/01/24 (!) 140/80   Pulse Readings from Last 3 Encounters:  07/29/24 81  07/07/24 90  07/01/24 84     Labs:?  Lab Results  Component Value Date   HGBA1C 6.8 (H) 07/07/2024   HGBA1C 6.8 (H) 03/02/2024   HGBA1C 7.2 (A) 11/27/2023   GLUCOSE 139 (H) 07/13/2024   MICRALBCREAT 116 (H) 07/07/2024   MICRALBCREAT 240 (H) 06/25/2023   CREATININE 0.91 07/13/2024   CREATININE 0.94 07/01/2024   CREATININE 1.02 (H) 03/17/2024    Lab Results  Component Value Date   CHOL 147 07/01/2024   LDLCALC 74 07/01/2024   LDLCALC 79 06/25/2023   LDLCALC 69 03/14/2023   HDL 48 07/01/2024   TRIG 123 07/01/2024   TRIG 112 06/25/2023   TRIG 110 03/14/2023   ALT 10 03/02/2024   ALT 13 06/25/2023   AST 18 03/02/2024   AST 17 06/25/2023      Chemistry      Component Value Date/Time   NA 139 07/13/2024 0956   NA 143 11/22/2022 1010   K 4.1 07/13/2024 0956   CL 109 07/13/2024 0956   CO2 23 07/13/2024 0956   BUN 11 07/13/2024 0956   BUN 14 11/22/2022 1010   CREATININE 0.91 07/13/2024 0956   CREATININE 0.83 03/02/2024 1043      Component Value Date/Time   CALCIUM  9.4 07/13/2024 0956  ALKPHOS 60 08/03/2021 1748   AST 18 03/02/2024 1043   ALT 10 03/02/2024 1043   BILITOT 0.3 03/02/2024 1043       The 10-year ASCVD risk score (Arnett DK, et al., 2019) is: 23.7%  Lab Results  Component Value Date   MICRALBCREAT 116 (H) 07/07/2024   MICRALBCREAT 240 (H) 06/25/2023    A/P: Hypertension longstanding currently uncontrolled based on recent elevated office BP readings. Blood pressure goal of <130/80 mmHg. Medication adherence suboptimal for carvedilol  AM dose. Patient is not interested in discussing the addition of new medication therapies.  -Continued carvedilol  25 mg BID. Encouraged using phone alarms to remember  medication dosing -Continued spironolactone  25 mg daily -Encouraged monitoring BP 2-3x per week at least an hour after medications  Diabetes currently controlled with a most recent A1c of 6.8% on 07/07/24. Patient is able to verbalize appropriate hypoglycemia management plan. Medication adherence appears suboptimal. Patient reports needing an updated meter and testing supplies -Continued GLP-1 Ozempic  (semaglutide ) 1 mg weekly -Continued SGLT2-I Jardiance  (empagliflozin ) 10 mg daily.  -Extensively discussed pathophysiology of diabetes, recommended lifestyle interventions, dietary effects on blood sugar control.  -Counseled on s/sx of and management of hypoglycemia.  -Sent Rx for BG testing supplies  ASCVD risk - secondary prevention in patient with diabetes. Last LDL is 74 mg/dL, not at goal of <44 mg/dL.  -Continued rosuvastatin  20 mg daily. Refill sent to the pharmacy  MISC: patient reported c/o hot flashes overnight. Encouraged patient to schedule follow up with PCP. Patient vocalized understanding.   Patient verbalized understanding of treatment plan. Total time patient counseling 30 minutes.  Follow-up:  Pharmacist on 01/04/25  Almeta Geisel E. Marsh, PharmD Clinical Pharmacist [facility] [phone]

## 2024-09-30 ENCOUNTER — Other Ambulatory Visit: Admitting: Pharmacist

## 2024-10-01 ENCOUNTER — Encounter: Payer: Self-pay | Admitting: Nurse Practitioner

## 2024-10-01 ENCOUNTER — Encounter (HOSPITAL_COMMUNITY)

## 2024-10-01 ENCOUNTER — Ambulatory Visit: Payer: Self-pay

## 2024-10-01 ENCOUNTER — Ambulatory Visit: Admitting: Nurse Practitioner

## 2024-10-01 VITALS — BP 122/70 | HR 88 | Resp 16 | Ht 62.0 in | Wt 187.0 lb

## 2024-10-01 DIAGNOSIS — M25562 Pain in left knee: Secondary | ICD-10-CM | POA: Diagnosis not present

## 2024-10-01 DIAGNOSIS — R61 Generalized hyperhidrosis: Secondary | ICD-10-CM | POA: Diagnosis not present

## 2024-10-01 DIAGNOSIS — H9319 Tinnitus, unspecified ear: Secondary | ICD-10-CM | POA: Diagnosis not present

## 2024-10-01 DIAGNOSIS — D508 Other iron deficiency anemias: Secondary | ICD-10-CM

## 2024-10-01 DIAGNOSIS — R5383 Other fatigue: Secondary | ICD-10-CM | POA: Diagnosis not present

## 2024-10-01 MED ORDER — MELOXICAM 7.5 MG PO TABS: 7.5000 mg | ORAL_TABLET | Freq: Every day | ORAL | 0 refills | Status: DC

## 2024-10-01 NOTE — Progress Notes (Signed)
 BP 122/70   Pulse 88   Resp 16   Ht 5' 2 (1.575 m)   Wt 187 lb (84.8 kg)   SpO2 98%   BMI 34.20 kg/m    Subjective:    Patient ID: Dawn Rice, female    DOB: 02-10-1956, 68 y.o.   MRN: [id number]  HPI: Dawn Rice is a 68 y.o. female  Chief Complaint  Patient presents with   Night Sweats   Fatigue   Knee Pain    Right, x1 week   Discussed the use of AI scribe software for clinical note transcription with the patient, who gave verbal consent to proceed.  History of Present Illness Dawn Rice is a 68 year old female who presents with night sweats, fatigue, tinnitus, and knee pain.  Night sweats - Night sweats present for an unspecified duration - Wakes up with gown soaking wet, particularly affecting the back and area under the breasts - Symptoms occur exclusively at night  Fatigue - Persistent fatigue and feeling unusually tired - History of low iron  levels, currently taking iron  supplements - No significant lower extremity swelling - No severe pain  Tinnitus - Persistent ringing in the ears - Not localized to one side   Right knee pain - Knee pain present for 1-2 weeks following a stretching incident - Difficulty walking since onset - Diagnosed with right knee arthritis by sports medicine on September 22, 2024 - X-ray confirmed arthritis in the right knee - No anti-inflammatory medication taken as none was provided  Weight gain and glycemic control - Concern about weight gain - Hemoglobin A1c measured at 6.8 - Taking diabetes medication regularly and monitoring diet - Concerned about blood sugar levels and risk of blood clots - No significant swelling or other symptoms indicative of a clot  Nail changes - Fingernails split and do not grow properly - Suspects possible vitamin deficiencies         08/27/2024   10:19 AM 07/29/2024   10:11 AM 11/27/2023    1:49 PM  Depression screen PHQ 2/9  Decreased Interest 0 0 0  Down,  Depressed, Hopeless 0 0 0  PHQ - 2 Score 0 0 0  Altered sleeping 0  0  Tired, decreased energy 0  0  Change in appetite 0  0  Feeling bad or failure about yourself  0  0  Trouble concentrating 0  0  Moving slowly or fidgety/restless 0  0  Suicidal thoughts 0  0  PHQ-9 Score 0  0  Difficult doing work/chores Not difficult at all      Relevant past medical, surgical, family and social history reviewed and updated as indicated. Interim medical history since our last visit reviewed. Allergies and medications reviewed and updated.  Review of Systems  Ten systems reviewed and is negative except as mentioned in HPI      Objective:      BP 122/70   Pulse 88   Resp 16   Ht 5' 2 (1.575 m)   Wt 187 lb (84.8 kg)   SpO2 98%   BMI 34.20 kg/m    Wt Readings from Last 3 Encounters:  10/01/24 187 lb (84.8 kg)  07/29/24 186 lb 9.6 oz (84.6 kg)  07/07/24 189 lb 3.2 oz (85.8 kg)    Physical Exam GENERAL: Alert, cooperative, well developed, no acute distress HEENT: Normocephalic, normal oropharynx, moist mucous membranes CHEST: Clear to auscultation bilaterally, No wheezes, rhonchi, or crackles CARDIOVASCULAR: Normal heart  rate and rhythm, S1 and S2 normal without murmurs ABDOMEN: Soft, non-tender, non-distended, without organomegaly, Normal bowel sounds EXTREMITIES: No cyanosis or edema NEUROLOGICAL: Cranial nerves grossly intact, Moves all extremities without gross motor or sensory deficit  Results for orders placed or performed during the hospital encounter of 07/13/24  Basic Metabolic Panel (BMET)   Collection Time: 07/13/24  9:56 AM  Result Value Ref Range   Sodium 139 135 - 145 mmol/L   Potassium 4.1 3.5 - 5.1 mmol/L   Chloride 109 98 - 111 mmol/L   CO2 23 22 - 32 mmol/L   Glucose, Bld 139 (H) 70 - 99 mg/dL   BUN 11 8 - 23 mg/dL   Creatinine, Ser 9.08 0.44 - 1.00 mg/dL   Calcium  9.4 8.9 - 10.3 mg/dL   GFR, Estimated >39 >39 mL/min   Anion gap 7 5 - 15           Assessment & Plan:   Problem List Items Addressed This Visit   None Visit Diagnoses       Other fatigue    -  Primary   Relevant Orders   Analyzer ANA IFA w/RFLX Titer/Pattern,Systemic Autoimmune Panel 1   CBC with Differential/Platelet   Comprehensive metabolic panel with GFR   TSH   Iron , TIBC and Ferritin Panel   B12 and Folate Panel   VITAMIN D  25 Hydroxy (Vit-D Deficiency, Fractures)   B. burgdorfi antibodies   QuantiFERON-TB Gold Plus   Sed Rate (ESR)   C-reactive protein     Night sweats       Relevant Orders   Analyzer ANA IFA w/RFLX Titer/Pattern,Systemic Autoimmune Panel 1   CBC with Differential/Platelet   Comprehensive metabolic panel with GFR   TSH   Iron , TIBC and Ferritin Panel   B12 and Folate Panel   VITAMIN D  25 Hydroxy (Vit-D Deficiency, Fractures)   B. burgdorfi antibodies   QuantiFERON-TB Gold Plus   Sed Rate (ESR)   C-reactive protein     Tinnitus, unspecified laterality       Relevant Orders   Analyzer ANA IFA w/RFLX Titer/Pattern,Systemic Autoimmune Panel 1   CBC with Differential/Platelet   Comprehensive metabolic panel with GFR   TSH   Iron , TIBC and Ferritin Panel   B12 and Folate Panel   VITAMIN D  25 Hydroxy (Vit-D Deficiency, Fractures)   B. burgdorfi antibodies   QuantiFERON-TB Gold Plus   Sed Rate (ESR)   C-reactive protein     Arthralgia of left knee       Relevant Medications   meloxicam  (MOBIC ) 7.5 MG tablet   Other Relevant Orders   Analyzer ANA IFA w/RFLX Titer/Pattern,Systemic Autoimmune Panel 1   CBC with Differential/Platelet   Comprehensive metabolic panel with GFR   TSH   Iron , TIBC and Ferritin Panel   B12 and Folate Panel   VITAMIN D  25 Hydroxy (Vit-D Deficiency, Fractures)   B. burgdorfi antibodies   QuantiFERON-TB Gold Plus   Sed Rate (ESR)   C-reactive protein     Other iron  deficiency anemia       Relevant Orders   CBC with Differential/Platelet   Iron , TIBC and Ferritin Panel        Assessment  and Plan Assessment & Plan Night sweats, fatigue, and tinnitus (under evaluation) Night sweats, fatigue, and tinnitus have persisted for two to three months. Differential diagnosis includes autoimmune disorders, vitamin deficiencies, and systemic conditions. Iron  deficiency is considered due to low iron  levels. - Order CBC, CMP, TSH,  iron  studies, B12, folate, vitamin D , Lyme disease, TB, sed rate, CRP, and autoimmune panel.  Iron  deficiency (suspected) Suspected iron  deficiency due to previous low iron  levels and current fatigue. She is taking iron  supplements. - Recheck iron  levels as part of the ordered blood work.  Right knee osteoarthritis Diagnosed with right knee osteoarthritis with x-ray showing mild to moderate medial and patellofemoral compartment degenerative changes and a small joint effusion. Symptoms include anterior knee pain and occasional soreness. Physical therapy is scheduled to start on October 14th. - Prescribe meloxicam  7.5 mg once daily with food for pain management. - Encourage continuation with scheduled physical therapy.  Type 2 diabetes mellitus Type 2 diabetes mellitus with recent A1c of 6.8%, previously 7.2%. The condition is well-controlled, but there is concern about weight gain and lack of energy. - Monitor blood glucose levels and continue current diabetes management regimen.        Follow up plan: Return if symptoms worsen or fail to improve.

## 2024-10-01 NOTE — Telephone Encounter (Signed)
 FYI Only or Action Required?: FYI only for provider.  Patient was last seen in primary care on 07/29/2024 by Gareth Mliss FALCON, FNP.  Called Nurse Triage reporting Leg Pain, Night sweats, Ringing in ears and fatigue.  Symptoms began a week ago. Knee pain just over 1 week. Night sweats, and fatigue have been on and off for 2-3 months  Interventions attempted: Other: Seen at sports medicine for knee. No interventions for night sweats, and fatigue.  Symptoms are: stable.  Triage Disposition: See in office Patient/caregiver understands and will follow disposition?: yes                   Copied from CRM #[id number]. Topic: Clinical - Red Word Triage >> Oct 01, 2024  8:20 AM Fonda T wrote: [facility] that prompted transfer to Nurse Triage: Patient calling states she is having right leg pain, waking up wet/sweaty, and fatigues, per patient states just not feeling right. Reason for Disposition  [1] MODERATE pain (e.g., interferes with normal activities, limping) AND [2] present > 3 days  Answer Assessment - Initial Assessment Questions Pt has several complaints. She is experiencing knee/leg apin. She has been seen for this. Diagnosed as arthritis. Also Night sweats, ringing in ears and fatigue which has been on and off for several months.   1. ONSET: When did the pain start?      1 week 2. LOCATION: Where is the pain located?      Right knee - and sometimes to her ankle 3. PAIN: How bad is the pain?    (Scale 1-10; or mild, moderate, severe)     moderate 4. WORK OR EXERCISE: Has there been any recent work or exercise that involved this part of the body?      Stretched in bed 5. CAUSE: What do you think is causing the leg pain?     Was told it was arthritis 6. OTHER SYMPTOMS: Do you have any other symptoms? (e.g., chest pain, back pain, breathing difficulty, swelling, rash, fever, numbness, weakness)     Ringing in ear and weakness, and night sweats  Protocols used:  Leg Pain-A-AH

## 2024-10-02 ENCOUNTER — Ambulatory Visit: Payer: Self-pay | Admitting: Nurse Practitioner

## 2024-10-06 ENCOUNTER — Encounter: Admitting: Genetic Counselor

## 2024-10-08 ENCOUNTER — Telehealth: Payer: Self-pay

## 2024-10-08 LAB — QUANTIFERON-TB GOLD PLUS
Mitogen-NIL: 7.11 [IU]/mL
NIL: 0.01 [IU]/mL
QuantiFERON-TB Gold Plus: NEGATIVE
TB1-NIL: 0 [IU]/mL
TB2-NIL: 0 [IU]/mL

## 2024-10-08 LAB — ANALYZER(R)ANA IFA WITH REFLEX TITER/PATTRN,SYS AUTOIMM PNL1
Anti Nuclear Antibody (ANA): NEGATIVE
Anticardiolipin IgA: <2 [APL'U]/mL
Anticardiolipin IgG: <2 [GPL'U]/mL
Anticardiolipin IgM: <2 [MPL'U]/mL
Beta-2 Glyco 1 IgA: <2 U/mL
Beta-2 Glyco 1 IgM: <2 U/mL
Beta-2 Glyco I IgG: <2 U/mL
C3 Complement: 138 mg/dL (ref 83–193)
C4 Complement: 25 mg/dL (ref 15–57)
Centromere Ab Screen: <1 AI
Chromatin (Nucleosomal) Antibody: <1 AI
Cyclic Citrullin Peptide Ab: <16 U
DNA Ab (DS) Crithidia, IFA: NEGATIVE
ENA SM Ab Ser-aCnc: <1 AI
Jo-1 Autoabs: <1 AI
MUTATED CITRULLINATED VIMENTIN (MCV) AB: <20 U/mL (ref <20)
Rheumatoid Factor (IgA): <5 U
Rheumatoid Factor (IgG): <5 U
Rheumatoid Factor (IgM): <5 U
Ribonucleic Protein(ENA) Antibody, IgG: <1 AI
SM/RNP: <1 AI
SSA (Ro) (ENA) Antibody, IgG: <1 AI
SSB (La) (ENA) Antibody, IgG: <1 AI
Scleroderma (Scl-70) (ENA) Antibody, IgG: <1 AI
Thyroperoxidase Ab SerPl-aCnc: <1 [IU]/mL (ref <9)

## 2024-10-08 LAB — CBC WITH DIFFERENTIAL/PLATELET
Absolute Lymphocytes: 1954 {cells}/uL (ref 850–3900)
Absolute Monocytes: 360 {cells}/uL (ref 200–950)
Basophils Absolute: 29 {cells}/uL (ref 0–200)
Basophils Relative: 0.6 %
Eosinophils Absolute: 259 {cells}/uL (ref 15–500)
Eosinophils Relative: 5.4 %
HCT: 41.1 % (ref 35.0–45.0)
Hemoglobin: 13 g/dL (ref 11.7–15.5)
MCH: 26.5 pg — ABNORMAL LOW (ref 27.0–33.0)
MCHC: 31.6 g/dL — ABNORMAL LOW (ref 32.0–36.0)
MCV: 83.7 fL (ref 80.0–100.0)
MPV: 11.6 fL (ref 7.5–12.5)
Monocytes Relative: 7.5 %
Neutro Abs: 2198 {cells}/uL (ref 1500–7800)
Neutrophils Relative %: 45.8 %
Platelets: 314 Thousand/uL (ref 140–400)
RBC: 4.91 Million/uL (ref 3.80–5.10)
RDW: 15.8 % — ABNORMAL HIGH (ref 11.0–15.0)
Total Lymphocyte: 40.7 %
WBC: 4.8 Thousand/uL (ref 3.8–10.8)

## 2024-10-08 LAB — COMPREHENSIVE METABOLIC PANEL WITH GFR
AG Ratio: 1.7 (calc) (ref 1.0–2.5)
ALT: 9 U/L (ref 6–29)
AST: 16 U/L (ref 10–35)
Albumin: 4.8 g/dL (ref 3.6–5.1)
Alkaline phosphatase (APISO): 68 U/L (ref 37–153)
BUN/Creatinine Ratio: 13 (calc) (ref 6–22)
BUN: 17 mg/dL (ref 7–25)
CO2: 26 mmol/L (ref 20–32)
Calcium: 9.7 mg/dL (ref 8.6–10.4)
Chloride: 109 mmol/L (ref 98–110)
Creat: 1.27 mg/dL — ABNORMAL HIGH (ref 0.50–1.05)
Globulin: 2.9 g/dL (ref 1.9–3.7)
Glucose, Bld: 125 mg/dL — ABNORMAL HIGH (ref 65–99)
Potassium: 4.8 mmol/L (ref 3.5–5.3)
Sodium: 143 mmol/L (ref 135–146)
Total Bilirubin: 0.2 mg/dL (ref 0.2–1.2)
Total Protein: 7.7 g/dL (ref 6.1–8.1)
eGFR: 47 mL/min/1.73m2 — ABNORMAL LOW (ref >=60)

## 2024-10-08 LAB — IRON,TIBC AND FERRITIN PANEL
%SAT: 12 % — ABNORMAL LOW (ref 16–45)
Ferritin: 5 ng/mL — ABNORMAL LOW (ref 16–288)
Iron: 50 ug/dL (ref 45–160)
TIBC: 409 ug/dL (ref 250–450)

## 2024-10-08 LAB — C-REACTIVE PROTEIN: CRP: <3 mg/L (ref <8.0)

## 2024-10-08 LAB — B12 AND FOLATE PANEL
Folate: 10.6 ng/mL
Vitamin B-12: 280 pg/mL (ref 200–1100)

## 2024-10-08 LAB — TSH: TSH: 0.8 m[IU]/L (ref 0.40–4.50)

## 2024-10-08 LAB — B. BURGDORFI ANTIBODIES: B burgdorferi Ab IgG+IgM: <0.9 {index}

## 2024-10-08 LAB — VITAMIN D 25 HYDROXY (VIT D DEFICIENCY, FRACTURES): Vit D, 25-Hydroxy: 26 ng/mL — ABNORMAL LOW (ref 30–100)

## 2024-10-08 LAB — SEDIMENTATION RATE: Sed Rate: 6 mm/h (ref 0–30)

## 2024-10-08 NOTE — Telephone Encounter (Signed)
 Gave pt a call, pt is coming up due for re-enrollment for 2026 on BICARES Synjardy ,will mail out pap pt portion and faxed provider portion,left a HIPAA VM.

## 2024-10-09 NOTE — Telephone Encounter (Signed)
 Received provider portion back from provider office on BICARES synrady.

## 2024-10-13 ENCOUNTER — Ambulatory Visit

## 2024-10-13 DIAGNOSIS — E538 Deficiency of other specified B group vitamins: Secondary | ICD-10-CM

## 2024-10-13 DIAGNOSIS — Z23 Encounter for immunization: Secondary | ICD-10-CM

## 2024-10-13 MED ORDER — CYANOCOBALAMIN 1000 MCG/ML IJ SOLN
1000.0000 ug | Freq: Once | INTRAMUSCULAR | Status: AC
  Administered 2024-10-13: 1000 ug via INTRAMUSCULAR

## 2024-10-14 ENCOUNTER — Telehealth (INDEPENDENT_AMBULATORY_CARE_PROVIDER_SITE_OTHER): Admitting: Nurse Practitioner

## 2024-10-14 DIAGNOSIS — D508 Other iron deficiency anemias: Secondary | ICD-10-CM | POA: Insufficient documentation

## 2024-10-14 NOTE — Progress Notes (Signed)
 Name: Dawn Rice   MRN: [id number]    DOB: 09-12-1956   Date:10/14/2024       Progress Note  Subjective  Chief Complaint  Chief Complaint  Patient presents with   Labs Only    Discuss lab results in detail, and what they mean    I connected with  Rock Dawn Rice  on 10/14/24 at 10:20 AM EDT by a video enabled telemedicine application and verified that I am speaking with the correct person using two identifiers.  I discussed the limitations of evaluation and management by telemedicine and the availability of in person appointments. The patient expressed understanding and agreed to proceed with a virtual visit  Staff also discussed with the patient that there may be a patient responsible charge related to this service. Patient Location: home Provider Location: [facility] Additional Individuals present: alone  HPI   Discussed the use of AI scribe software for clinical note transcription with the patient, who gave verbal consent to proceed.  History of Present Illness Dawn Rice is a 68 year old female with iron  deficiency anemia who presents with night sweats and fatigue.  Constitutional symptoms - Night sweats present - Fatigue and sleepiness persist despite daily iron  supplementation - Difficulty initiating and maintaining sleep at night: 'I be tired, but then I can't sleep at night.'  Iron  deficiency and anemia - Iron  studies show normal iron  levels but low ferritin - Ongoing iron  supplementation daily - Fatigue persists despite treatment  Vitamin and nutritional deficiencies - Vitamin D  level was slightly low but has improved - Vitamin B12 level low at 280; acknowledges B12 is 'really, really low' - Low B12 may contribute to numbness, tingling, and fatigue  Renal function abnormality - Kidney function tests show slightly low GFR, a new finding - Expresses concern due to family history of kidney disease ('just lost my sister behind kidneys') - Currently  taking Jardiance     Latest Ref Rng & Units 10/01/2024   12:15 PM 07/13/2024    9:56 AM 07/01/2024    3:09 PM  CMP  Glucose 65 - 99 mg/dL 874  860  863   BUN 7 - 25 mg/dL 17  11  16    Creatinine 0.50 - 1.05 mg/dL 8.72  9.08  9.05   Sodium 135 - 146 mmol/L 143  139  142   Potassium 3.5 - 5.3 mmol/L 4.8  4.1  4.4   Chloride 98 - 110 mmol/L 109  109  104   CO2 20 - 32 mmol/L 26  23  26    Calcium  8.6 - 10.4 mg/dL 9.7  9.4  9.6   Total Protein 6.1 - 8.1 g/dL 7.7     Total Bilirubin 0.2 - 1.2 mg/dL 0.2     AST 10 - 35 U/L 16     ALT 6 - 29 U/L 9        Laboratory and infectious disease evaluation - Inflammatory markers (CRP, sed rate) normal - Tuberculosis and Lyme disease tests negative  Thyroid  function - Thyroid  function tests normal    Patient Active Problem List   Diagnosis Date Noted   Iron  deficiency anemia secondary to inadequate dietary iron  intake 10/14/2024   OSA (obstructive sleep apnea) 07/07/2024   Chronic congestive heart failure (HCC) 06/25/2023   Coronary artery disease involving native coronary artery of native heart 09/16/2022   Obesity (BMI 30-39.9) 09/16/2022   House dust mite allergy  07/08/2019   Essential hypertension, benign    Hyperlipidemia  01/19/2017   Diabetes (HCC) 01/19/2017   Seizures (HCC) 01/19/2017   Gout     Social History   Tobacco Use   Smoking status: Former    Current packs/day: 0.00    Average packs/day: 0.5 packs/day for 20.0 years (10.0 ttl pk-yrs)    Types: Cigarettes    Start date: 45    Quit date: 2009    Years since quitting: 16.8   Smokeless tobacco: Never  Substance Use Topics   Alcohol use: Yes    Comment: 07/22/2017 glass of wine maybe once/month     Current Outpatient Medications:    Aspirin -Caffeine (BC FAST PAIN RELIEF PO), Take 1 packet by mouth daily as needed (pain)., Disp: , Rfl:    Blood Glucose Monitoring Suppl DEVI, Use to check blood sugar up to 3 times a day. May substitute to any manufacturer covered  by patient's insurance., Disp: 1 each, Rfl: 0   carvedilol  (COREG ) 25 MG tablet, Take 1 tablet (25 mg total) by mouth 2 (two) times daily with a meal., Disp: 180 tablet, Rfl: 3   cetirizine  (ZYRTEC ) 10 MG tablet, Take 1 tablet (10 mg total) by mouth daily., Disp: 30 tablet, Rfl: 0   empagliflozin  (JARDIANCE ) 10 MG TABS tablet, Take 1 tablet (10 mg total) by mouth daily before breakfast., Disp: 90 tablet, Rfl: 3   famotidine  (PEPCID ) 20 MG tablet, Take 1 tablet (20 mg total) by mouth 2 (two) times daily., Disp: 60 tablet, Rfl: 0   Glucose Blood (BLOOD GLUCOSE TEST STRIPS) STRP, Use to check blood sugar up to 3 times a day. May substitute to any manufacturer covered by patient's insurance., Disp: 100 strip, Rfl: 3   hydrocortisone  1 % ointment, Apply 1 Application topically 2 (two) times daily., Disp: 30 g, Rfl: 0   Iron , Ferrous Sulfate , 325 (65 Fe) MG TABS, Take 325 mg by mouth daily., Disp: 90 tablet, Rfl: 1   Ketotifen Fumarate (ALLERGY  EYE DROPS OP), Place 1 drop into both eyes daily as needed (allergies)., Disp: , Rfl:    Lancet Device MISC, Use to check blood sugar up to 3 times a day. May substitute to any manufacturer covered by patient's insurance., Disp: 1 each, Rfl: 0   Lancets Misc. MISC, Use to check blood sugar up to 3 times a day. May substitute to any manufacturer covered by patient's insurance., Disp: 100 each, Rfl: 3   rosuvastatin  (CRESTOR ) 20 MG tablet, Take 1 tablet (20 mg total) by mouth daily., Disp: 90 tablet, Rfl: 3   Semaglutide , 1 MG/DOSE, 4 MG/3ML SOPN, Inject 1 mg as directed once a week., Disp: 9 mL, Rfl: 1   spironolactone  (ALDACTONE ) 25 MG tablet, Take 1 tablet (25 mg total) by mouth daily., Disp: 90 tablet, Rfl: 3   triamcinolone  cream (KENALOG ) 0.1 %, Apply 1 Application topically 2 (two) times daily., Disp: 30 g, Rfl: 0  Allergies  Allergen Reactions   Contrave [Naltrexone-Bupropion Hcl Er] Nausea And Vomiting   Metformin  And Related Nausea Only   Penicillins  Itching    Has patient had a PCN reaction causing immediate rash, facial/tongue/throat swelling, SOB or lightheadedness with hypotension: No Has patient had a PCN reaction causing severe rash involving mucus membranes or skin necrosis: No Has patient had a PCN reaction that required hospitalization No Has patient had a PCN reaction occurring within the last 10 years: No If all of the above answers are NO, then may proceed with Cephalosporin use.    I personally reviewed active problem  list, medication list, allergies, notes from last encounter, lab results with the patient/caregiver today.  ROS  Ten systems reviewed and is negative except as mentioned in HPI   Objective  Virtual encounter, vitals not obtained.  There is no height or weight on file to calculate BMI.  Nursing Note and Vital Signs reviewed.  Physical Exam  Awake, alert and oriented, speaking in complete sentences   No results found for this or any previous visit (from the past 72 hours).  Assessment & Plan  Assessment and Plan Assessment & Plan Iron  deficiency anemia Chronic iron  deficiency anemia with low ferritin levels, contributing to fatigue. Despite daily iron  supplementation, ferritin remains low, indicating possible need for further intervention. - Refer to hematology for evaluation and potential iron  infusions.  Vitamin B12 deficiency Vitamin B12 level is significantly low at 280, which can contribute to neurological symptoms and fatigue. - Administer B12 injections as needed.  Decreased kidney function Slightly decreased GFR, a new finding. Potential contributing factors include NSAID use. - Recheck kidney function in three months (January 9th). - Perform microalbumin test at next visit. - Advise to increase fluid intake and avoid NSAIDs, including meloxicam . - Discontinue meloxicam . - Continue Jardiance  for kidney protection.  Vitamin D  insufficiency Vitamin D  level is slightly low but  improved from previous levels, indicating insufficiency rather than deficiency. Not the primary cause of fatigue.      -Red flags and when to present for emergency care or RTC including fever >101.68F, chest pain, shortness of breath, new/worsening/un-resolving symptoms,  reviewed with patient at time of visit. Follow up and care instructions discussed and provided in AVS. - I discussed the assessment and treatment plan with the patient. The patient was provided an opportunity to ask questions and all were answered. The patient agreed with the plan and demonstrated an understanding of the instructions.  I provided 15 minutes of non-face-to-face time during this encounter.  Mliss JULIANNA Spray, FNP

## 2024-10-21 ENCOUNTER — Telehealth (HOSPITAL_COMMUNITY): Payer: Self-pay

## 2024-10-21 NOTE — Telephone Encounter (Signed)
 Called to confirm/remind patient of their appointment [facility] on 10/22/[facility].   Appointment:   [x] Confirmed  [] Left mess   [] No answer/No voice mail  [] VM Full/unable to leave message  [] Phone not in service  Patient reminded to bring all medications and/or complete list.  Confirmed patient has transportation. Gave directions, instructed to utilize valet parking.

## 2024-10-22 ENCOUNTER — Ambulatory Visit (HOSPITAL_COMMUNITY)
Admission: RE | Admit: 2024-10-22 | Discharge: 2024-10-22 | Disposition: A | Discharge status: Home / Self Care | Source: Ambulatory Visit | Attending: Physician Assistant | Admitting: Physician Assistant

## 2024-10-22 ENCOUNTER — Encounter (HOSPITAL_COMMUNITY): Payer: Self-pay

## 2024-10-22 VITALS — BP 126/88 | HR 87 | Ht 62.0 in | Wt 186.6 lb

## 2024-10-22 DIAGNOSIS — I110 Hypertensive heart disease with heart failure: Secondary | ICD-10-CM | POA: Diagnosis not present

## 2024-10-22 DIAGNOSIS — D509 Iron deficiency anemia, unspecified: Secondary | ICD-10-CM | POA: Insufficient documentation

## 2024-10-22 DIAGNOSIS — D508 Other iron deficiency anemias: Secondary | ICD-10-CM

## 2024-10-22 DIAGNOSIS — E119 Type 2 diabetes mellitus without complications: Secondary | ICD-10-CM | POA: Diagnosis present

## 2024-10-22 DIAGNOSIS — G4733 Obstructive sleep apnea (adult) (pediatric): Secondary | ICD-10-CM | POA: Insufficient documentation

## 2024-10-22 DIAGNOSIS — Z79899 Other long term (current) drug therapy: Secondary | ICD-10-CM | POA: Diagnosis not present

## 2024-10-22 DIAGNOSIS — Z7984 Long term (current) use of oral hypoglycemic drugs: Secondary | ICD-10-CM | POA: Insufficient documentation

## 2024-10-22 DIAGNOSIS — I10 Essential (primary) hypertension: Secondary | ICD-10-CM

## 2024-10-22 DIAGNOSIS — Z7982 Long term (current) use of aspirin: Secondary | ICD-10-CM | POA: Insufficient documentation

## 2024-10-22 DIAGNOSIS — I2510 Atherosclerotic heart disease of native coronary artery without angina pectoris: Secondary | ICD-10-CM | POA: Diagnosis not present

## 2024-10-22 DIAGNOSIS — I272 Pulmonary hypertension, unspecified: Secondary | ICD-10-CM | POA: Diagnosis not present

## 2024-10-22 DIAGNOSIS — I422 Other hypertrophic cardiomyopathy: Secondary | ICD-10-CM | POA: Diagnosis not present

## 2024-10-22 DIAGNOSIS — I5032 Chronic diastolic (congestive) heart failure: Secondary | ICD-10-CM | POA: Diagnosis not present

## 2024-10-22 DIAGNOSIS — E785 Hyperlipidemia, unspecified: Secondary | ICD-10-CM | POA: Diagnosis present

## 2024-10-22 DIAGNOSIS — Z6834 Body mass index (BMI) 34.0-34.9, adult: Secondary | ICD-10-CM | POA: Diagnosis not present

## 2024-10-22 DIAGNOSIS — E669 Obesity, unspecified: Secondary | ICD-10-CM | POA: Diagnosis not present

## 2024-10-22 NOTE — Patient Instructions (Signed)
 No change in medications. Echo has been scheduled - see below. Referral to Dr. Fairy (Cardiology) re-sent. They will call you to schedule your first appointment. If not, please call them at number below. Return to see Dr. Rolan in 4 months. Call [phone] in February to schedule this appointment.  Please call us  at [phone] if any questions or concerns prior to your next appointment.

## 2024-10-22 NOTE — Progress Notes (Signed)
 ADVANCED HEART FAILURE CLINIC NOTE  PCP: Gareth Mliss FALCON, FNP Cardiology: Dr. Sheena HF Cardiology: Dr. Rolan  CC: HF follow up  HPI: 68 y.o. with history of HTN, hyperlipidemia, and diabetes was referred by Dr. Sheena for evaluation of diastolic CHF/pulmonary hypertension.  Patient has had some exertional dyspnea for a year.  4/23 echo showed EF 70-75%, severe LVH with some asymmetry of the basal septum, LV mid cavitary gradient peak 29 at rest and 51 with Valsalva, no SAM, normal RV.  Coronary CTA in 4/23 showed a moderate proximal LCx stenosis that did not appear hemodynamically significant by FFR. Her beta blocker was titrated up with LV mid-cavity gradient.  Stress echo was done in 12/23, showing that LV mid cavity gradient increased from 12 peak to 26 peak with exercise, PASP increased from 30 to 48 with exercise.  No wall motion abnormalities with exercise.   Also of note, patient had Fe deficiency anemia.  She was seen by GI and found to be guaiac negative. No BRBPR/melena. Given IV iron  after initial appointment.   Patient had RHC/LHC in 4/24 showing nonobstructive CAD, mildly elevated RA pressure and normal PCWP.   Cardiac MRI in 7/24 showed EF 65%, moderate asymmetric septal hypertrophy with no mitral valve systolic anterior motion, normal RV EF 53%, no delayed enhancement.   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   Here today for CHF follow-up. Stable from HF standpoint. No chest pain, shortness of breath or syncope. Main limitation is fatigue. Gets wiped out with chores and short walks. No orthopnea, PND or lower extremity edema. Reports her PCP recently obtained labs and her iron  and B12 levels are low. On Ozempic , reports weight loss has reached a plateau.  Denies ETOH and tobacco use.    Labs (11/23): hgb 7.6, K 4.6, creatinine 0.8 Labs (3/24): BNP 18.5, LDL 69, ferritin 7, transferrin saturation 8%, TSH normal, hgb 9.3, K 4.3, creatinine 0.92 Labs  (7/24): LDL 79, K 4.7, creatinine 9.15 Labs (3/25): K 4.6, Scr 0.83 =>1.02, A1C 6.8, TSH 0.84, hgb 11.6 Labs (10/25): T sat 12%, B-12 280, Hgb 13, TSH 0.8, Scr 1.27  PMH: 1. HTN 2. Type 2 diabetes 3. Hyperlipidemia 4. Epilepsy with h/o seizures. 5. Fe deficiency anemia 6. Remote tobacco use (quit 2009).  7. Chronic diastolic CHF:  ?Variant of hypertrophic cardiomyopathy.  Echo (4/23) with EF 70-75%, severe LVH with some asymmetry of the basal septum, LV mid cavitary gradient peak 29 at rest and 51 with Valsalva, no SAM, normal RV.  - Stress echo (12/23): LV mid cavity gradient increased from 12 peak to 26 peak with exercise, PASP increased from 30 to 48 with exercise.  No wall motion abnormalities with exercise.  - RHC (4/24): mean RA 9, PA 31/11 mean 22, mean PCWP 13, CI 3.39 - Cardiac MRI (7/24): EF 65%, moderate asymmetric septal hypertrophy with no mitral valve systolic anterior motion, normal RV EF 53%, no delayed enhancement. - TTN gene mutation of uncertain significance.  8. CAD: Coronary CTA in 4/23 showed 50-69% proximal LCx stenosis, FFR negative.  - Stress echo in 12/23 as above was negative for ischemia.  - LHC (4/24): Mild nonobstructive CAD.  9. OSA: Sleep study in 9/24 with OSA.   Social History   Socioeconomic History   Marital status: Widowed    Spouse name: Not on file   Number of children: 4   Years of education: Not on file   Highest education level: Not on file  Occupational History   Not on file  Tobacco Use   Smoking status: Former    Current packs/day: 0.00    Average packs/day: 0.5 packs/day for 20.0 years (10.0 ttl pk-yrs)    Types: Cigarettes    Start date: 60    Quit date: 2009    Years since quitting: 16.8   Smokeless tobacco: Never  Vaping Use   Vaping status: Never Used  Substance and Sexual Activity   Alcohol use: Yes    Comment: 07/22/2017 glass of wine maybe once/month   Drug use: No   Sexual activity: Not Currently  Other Topics  Concern   Not on file  Social History Narrative   Widowed mother of 4.   Quit smoking in 2008.   Occasional alcohol.   Social Drivers of Corporate Investment Banker Strain: Low Risk  (08/27/2024)   Overall Financial Resource Strain (CARDIA)    Difficulty of Paying Living Expenses: Not hard at all  Food Insecurity: No Food Insecurity (08/27/2024)   Hunger Vital Sign    Worried About Running Out of Food in the Last Year: Never true    Ran Out of Food in the Last Year: Never true  Transportation Needs: No Transportation Needs (08/27/2024)   PRAPARE - Administrator, Civil Service (Medical): No    Lack of Transportation (Non-Medical): No  Physical Activity: Insufficiently Active (08/27/2024)   Exercise Vital Sign    Days of Exercise per Week: 3 days    Minutes of Exercise per Session: 20 min  Stress: No Stress Concern Present (08/27/2024)   [employer] of Occupational Health - Occupational Stress Questionnaire    Feeling of Stress: Only a little  Social Connections: Moderately Isolated (08/27/2024)   Social Connection and Isolation Panel    Frequency of Communication with Friends and Family: More than three times a week    Frequency of Social Gatherings with Friends and Family: Once a week    Attends Religious Services: More than 4 times per year    Active Member of [employer] or Organizations: No    Attends Banker Meetings: Never    Marital Status: Widowed  Intimate Partner Violence: Not At Risk (08/27/2024)   Humiliation, Afraid, Rape, and Kick questionnaire    Fear of Current or Ex-Partner: No    Emotionally Abused: No    Physically Abused: No    Sexually Abused: No   FH: Mother with hole in her heart, father with CAD.   ROS: All systems reviewed and negative except as per HPI.   Current Outpatient Medications  Medication Sig Dispense Refill   Blood Glucose Monitoring Suppl DEVI Use to check blood sugar up to 3 times a day. May substitute to any  manufacturer covered by patient's insurance. 1 each 0   carvedilol  (COREG ) 25 MG tablet Take 1 tablet (25 mg total) by mouth 2 (two) times daily with a meal. 180 tablet 3   cetirizine  (ZYRTEC ) 10 MG tablet Take 1 tablet (10 mg total) by mouth daily. 30 tablet 0   empagliflozin  (JARDIANCE ) 10 MG TABS tablet Take 1 tablet (10 mg total) by mouth daily before breakfast. 90 tablet 3   famotidine  (PEPCID ) 20 MG tablet Take 1 tablet (20 mg total) by mouth 2 (two) times daily. (Patient taking differently: Take 20 mg by mouth daily as needed for heartburn or indigestion.) 60 tablet 0   Glucose Blood (BLOOD GLUCOSE TEST STRIPS) STRP Use to check blood sugar up to  3 times a day. May substitute to any manufacturer covered by patient's insurance. 100 strip 3   hydrocortisone  1 % ointment Apply 1 Application topically 2 (two) times daily. 30 g 0   Iron , Ferrous Sulfate , 325 (65 Fe) MG TABS Take 325 mg by mouth daily. 90 tablet 1   Ketotifen Fumarate (ALLERGY  EYE DROPS OP) Place 1 drop into both eyes daily as needed (allergies).     Lancet Device MISC Use to check blood sugar up to 3 times a day. May substitute to any manufacturer covered by patient's insurance. 1 each 0   Lancets Misc. MISC Use to check blood sugar up to 3 times a day. May substitute to any manufacturer covered by patient's insurance. 100 each 3   rosuvastatin  (CRESTOR ) 20 MG tablet Take 1 tablet (20 mg total) by mouth daily. 90 tablet 3   Semaglutide , 1 MG/DOSE, 4 MG/3ML SOPN Inject 1 mg as directed once a week. 9 mL 1   spironolactone  (ALDACTONE ) 25 MG tablet Take 1 tablet (25 mg total) by mouth daily. 90 tablet 3   triamcinolone  cream (KENALOG ) 0.1 % Apply 1 Application topically 2 (two) times daily. 30 g 0   Aspirin -Caffeine (BC FAST PAIN RELIEF PO) Take 1 packet by mouth daily as needed (pain). (Patient not taking: Reported on 10/22/2024)     No current facility-administered medications for this encounter.   BP 126/88   Pulse 87   Ht 5'  2 (1.575 m)   Wt 84.6 kg (186 lb 9.6 oz)   SpO2 97%   BMI 34.13 kg/m   Wt Readings from Last 3 Encounters:  10/22/24 84.6 kg (186 lb 9.6 oz)  10/01/24 84.8 kg (187 lb)  07/29/24 84.6 kg (186 lb 9.6 oz)   Physical Exam General:  Well appearing.  Cor: Regular rate & rhythm. No rubs, gallops or murmurs. Lungs: clear Abdomen: soft, nontender, nondistended.Good bowel sounds. Extremities: no cyanosis, clubbing, rash, edema Neuro: alert & orientedx3, cranial nerves grossly intact. moves all 4 extremities w/o difficulty. Affect pleasant   Assessment/Plan: 1. Chronic diastolic CHF/HCM:  Echo in 4/23 showed EF 70-75%, severe LVH with some asymmetry of the basal septum, LV mid cavitary gradient peak 29 at rest and 51 with Valsalva, no SAM, normal RV.  Coronary CTA in 4/23 showed a moderate proximal LCx stenosis that did not appear hemodynamically significant by FFR. Her beta blocker was titrated up with LV mid-cavity gradient.  Stress echo was done in 12/23, showing that LV mid cavity gradient increased from 12 peak to 26 peak with exercise, PASP increased from 30 to 48 with exercise.  No wall motion abnormalities with exercise.  LHC/RHC in 4/24 showed no significant coronary disease and mildly elevated RA pressure.  Cardiac MRI in 7/24 showed EF 65%, moderate asymmetric septal hypertrophy with no mitral valve systolic anterior motion, normal RV EF 53%, no delayed enhancement.  She could have a lower risk variant of hypertrophic cardiomyopathy.  She does not have evidence by MRI for cardiac amyloidosis. Genetic testing showed TTN gene mutation of uncertain significance, rarely TTN mutations can be associated with HCM.  She has a very soft outflow murmur on exam.  She is euvolemic on exam, NYHA class II symptoms. - Unable to get insurance approval for finerenone, she is on spironolactone . Recent CMET with Scr 1.27 and K 4.9. Scr slightly above baseline, PCP planning to follow. - Continue Coreg  25 mg  bid. - Unable to take diltiazem  CD (negative inotropy with mild mid-cavity  gradient) due to rash.  - Referred for her to see [facility] for evaluation of TTN gene mutation of uncertain significance. She cancelled the appointment d/t confusion, she is willing to reschedule. -Arrange repeat echo with attention to assess for significant LVOT gradient. - Continue Jardiance  10 mg daily 2. CAD: Coronary CTA in 4/23 with moderate LCx stenosis, not hemodynamically significant by FFR. LHC in 4/24 showed nonobstructive CAD. No chest apin. - Continue ASA + rosuvastatin .  3. Fe deficiency anemia: She has had IV Fe in the past.  - Recent iron  stores are low - Defer management to PCP >> considering referral to hematology for IV iron  infusions 4. HTN:  BP stable - Continues meds as above 5. OSA: Mild by sleep study, not on CPAP. - Needs weight loss. 6. Obesity: Body mass index is 34.13 kg/m. - She is on semaglutide , weight loss has reached plateau  Followup 4 months with Dr. Rolan   [facility], MANUELITA N 10/22/24

## 2024-10-28 ENCOUNTER — Inpatient Hospital Stay: Admitting: Oncology

## 2024-11-03 ENCOUNTER — Ambulatory Visit (HOSPITAL_COMMUNITY)

## 2024-11-09 LAB — HM DEXA SCAN

## 2024-11-09 LAB — HM MAMMOGRAPHY

## 2024-11-10 ENCOUNTER — Encounter: Payer: Self-pay | Admitting: Nurse Practitioner

## 2024-11-11 ENCOUNTER — Ambulatory Visit: Payer: Self-pay | Admitting: Nurse Practitioner

## 2024-11-17 ENCOUNTER — Ambulatory Visit

## 2024-11-20 ENCOUNTER — Ambulatory Visit (HOSPITAL_COMMUNITY)
Admission: RE | Admit: 2024-11-20 | Discharge: 2024-11-20 | Disposition: A | Source: Ambulatory Visit | Attending: Cardiology | Admitting: Cardiology

## 2024-11-20 DIAGNOSIS — I5032 Chronic diastolic (congestive) heart failure: Secondary | ICD-10-CM

## 2024-11-20 DIAGNOSIS — I082 Rheumatic disorders of both aortic and tricuspid valves: Secondary | ICD-10-CM | POA: Diagnosis not present

## 2024-11-20 DIAGNOSIS — I110 Hypertensive heart disease with heart failure: Secondary | ICD-10-CM | POA: Insufficient documentation

## 2024-11-20 LAB — ECHOCARDIOGRAM COMPLETE
Area-P 1/2: 4.89 cm2
S' Lateral: 2.8 cm

## 2024-12-02 ENCOUNTER — Ambulatory Visit: Payer: Self-pay

## 2024-12-02 NOTE — Telephone Encounter (Signed)
 Spoke with pt regarding PAP Bicares Syjardy),spoke with pt today explain she is no longer on this medication,pt does not need any pt's assistance.

## 2024-12-02 NOTE — Telephone Encounter (Signed)
 FYI Only or Action Required?: FYI only for provider: appointment scheduled on 12/12.  Patient was last seen in primary care on 10/14/2024 by Gareth Mliss FALCON, FNP.  Called Nurse Triage reporting Leg Pain.  Symptoms began several weeks ago.  Interventions attempted: OTC medications: tylenol , ibuprofen .  Symptoms are: gradually worsening.  Triage Disposition: See HCP Within 4 Hours (Or PCP Triage)  Patient/caregiver understands and will follow disposition?: Yes  Copied from CRM #[id number]. Topic: Clinical - Red Word Triage >> Dec 02, 2024  1:31 PM Emylou G wrote: [facility] that prompted transfer to Nurse Triage: legs are sore.. especially right leg.. sometimes hurts to walk - struggling when turning and getting up >> Dec 02, 2024  1:47 PM Emylou G wrote: Patient had to take another call.SABRA LILLETTE fitzpatrick NT will call her for an appt Reason for Disposition  [1] SEVERE pain (e.g., excruciating, unable to do any normal activities) AND [2] not improved after 2 hours of pain medicine  Answer Assessment - Initial Assessment Questions Back of her thigh down her leg. Limps on it due to the pain. Denies paresthesias, weakness, color or temp change, swelling, Fever, SOB, CP.  Denies low back pain and has never had sciatica. Denies hot hard knot. Diabetic- A1C is fine  Mild dizzy from iron  a little low.  Otc with no improvement  Appt made with PCP office 12/12 to assess. ED precautions understood.  1. ONSET: When did the pain start?      2-3 weeks  2. LOCATION: Where is the pain located?      Right leg 3. PAIN: How bad is the pain?    (Scale 1-10; or mild, moderate, severe)     Can't move her leg due to pain 8/10- hard time walking on it  4. WORK OR EXERCISE: Has there been any recent work or exercise that involved this part of the body?      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 CAUSE: What do you think is causing the leg pain?     Unsure  6. OTHER SYMPTOMS: Do  you have any other symptoms? (e.g., chest pain, back pain, breathing difficulty, swelling, rash, fever, numbness, weakness)  Protocols used: Leg Pain-A-AH

## 2024-12-04 ENCOUNTER — Ambulatory Visit: Admitting: Family Medicine

## 2024-12-04 NOTE — Telephone Encounter (Signed)
 Reason for Disposition  [1] Follow-up call to recent contact AND [2] information only call, no triage required  Answer Assessment - Initial Assessment Questions 1. REASON FOR CALL: What is the main reason for your call? or How can I best help you?     Patient had to cancel appt today- Daughter unable to get off work to bring her in. She states her pain has eased up and she has had no new concerning symptoms. She will call back to reschedule once she knows she will have a ride. Denies numbness, tingling, color or temp changes, fever, or hot hard knot.  ED precautions understood.  Protocols used: Information Only Call - No Triage-A-AH

## 2024-12-04 NOTE — Progress Notes (Deleted)
 [facility] CONSULT NOTE  Patient Care Team: Gareth Mliss FALCON, FNP as PCP - General (Nurse Practitioner) Sheena Pugh, DO as PCP - Cardiology (Cardiology) [facility] Marsh, Allyson E, RPH-CPP (Pharmacist)  ASSESSMENT & PLAN:  Dawn Rice is a 68 y.o. female with history of hypertension, hyperlipidemia, diabetes, seizure disorder, heart failure with preserved EF, CAD, OSA, iron  deficiency being seen for iron  deficiency anemia.  Ferritin was 5 and iron  saturation 12% in October 2025.  Relevant history: History of *** Last colonoscopy: Last EGD: Periods:   The mechanism of IDA is due to either blood loss or decreased absorptive mechanism or both. We discussed some of the risks, benefits, and alternatives of intravenous iron  infusions. The patient is symptomatic from anemia and the iron  level is critically low. She tolerated oral iron  supplement poorly and desires to achieved higher levels of iron  faster for adequate hematopoesis. Some of the side-effects to be expected including risks of infusion reactions, phlebitis, headaches, nausea and fatigue.  The patient is willing to proceed. Patient education material was dispensed.   Ordering iv iron  to be given at [address]  IV iron  *** ordered. CBC, Iron , TIBC, ferritin Referral for colonoscopy and EGD  Assessment & Plan   No orders of the defined types were placed in this encounter.     All questions were answered. The patient knows to call the clinic with any problems, questions or concerns.  Pauletta JAYSON Chihuahua, MD 12/12/20254:54 PM   CHIEF COMPLAINTS/PURPOSE OF CONSULTATION:  Anemia  HISTORY OF PRESENTING ILLNESS:  Dawn Rice 68 y.o. female is here because of anemia.  Patient's records show iron  deficiency anemia on several occasions this year.  Most recent lab in October showed ferritin of 5, iron  saturation 12%.  Normal folic acid .  B12 was 280.  MCV was 83.  Hemoglobin was  13.  Records show patient has been getting B12 injection 4 times over the past year with last one in October 2025.  She received 1 dose of IV Feraheme in April 2024.  Hemoglobin improved to 12 in July 2024.  Hemoglobin decreased again December 20 24-10.4, 11.3 in February, 11.6 in March, 11.4 in July and 13 in October 2025.  Last colonoscopy reported in 2022 with diverticula.  Report from biopsy from EGD show fragments of gastric hyperplastic polyp with erosion.  No malignancy.  Antrice had not noticed any recent bleeding such as melena, hematuria or hematochezia Her last colonoscopy was *** ***She had no prior history or diagnosis of cancer. Her age appropriate screening programs are up-to-date. ***She denies any pica and eats a variety of diet. ***She denies blood donation or received blood transfusion ***The patient was prescribed oral iron  supplements and she takes ***  MEDICAL HISTORY:  Past Medical History:  Diagnosis Date   Cataract    Epilepsy (HCC)    Gout    History of blood transfusion    when I got my hysterectomy (07/22/2017)   Hyperlipidemia    Hypertension    Seizures (HCC)    from epilepsy; the kind where I space out; last one was ~ 1 yr ago (07/22/2017)   Type II diabetes mellitus (HCC)     SURGICAL HISTORY: Past Surgical History:  Procedure Laterality Date   ABDOMINAL HYSTERECTOMY     APPENDECTOMY     CATARACT EXTRACTION     CATARACT EXTRACTION W/ INTRAOCULAR LENS  IMPLANT, BILATERAL Bilateral 2016-2017   right-left   CHOLECYSTECTOMY  03/2009  lap/notes 04/25/2011   LAPAROSCOPIC APPENDECTOMY N/A 07/21/2017   Procedure: APPENDECTOMY LAPAROSCOPIC;  Surgeon: Belinda Cough, MD;  Location: [facility] OR;  Service: General;  Laterality: N/A;   RIGHT/LEFT HEART CATH AND CORONARY ANGIOGRAPHY N/A 03/29/2023   Procedure: RIGHT/LEFT HEART CATH AND CORONARY ANGIOGRAPHY;  Surgeon: Rolan Ezra RAMAN, MD;  Location: [facility] INVASIVE CV LAB;  Service: Cardiovascular;  Laterality: N/A;    TUBAL LIGATION      SOCIAL HISTORY: Social History   Socioeconomic History   Marital status: Widowed    Spouse name: Not on file   Number of children: 4   Years of education: Not on file   Highest education level: Not on file  Occupational History   Not on file  Tobacco Use   Smoking status: Former    Current packs/day: 0.00    Average packs/day: 0.5 packs/day for 20.0 years (10.0 ttl pk-yrs)    Types: Cigarettes    Start date: 8    Quit date: 2009    Years since quitting: 16.9   Smokeless tobacco: Never  Vaping Use   Vaping status: Never Used  Substance and Sexual Activity   Alcohol use: Yes    Comment: 07/22/2017 glass of wine maybe once/month   Drug use: No   Sexual activity: Not Currently  Other Topics Concern   Not on file  Social History Narrative   Widowed mother of 4.   Quit smoking in 2008.   Occasional alcohol.   Social Drivers of Health   Tobacco Use: Medium Risk (10/22/2024)   Patient History    Smoking Tobacco Use: Former    Smokeless Tobacco Use: Never    Passive Exposure: Not on file  Financial Resource Strain: Low Risk (08/27/2024)   Overall Financial Resource Strain (CARDIA)    Difficulty of Paying Living Expenses: Not hard at all  Food Insecurity: No Food Insecurity (08/27/2024)   Epic    Worried About Programme Researcher, Broadcasting/film/video in the Last Year: Never true    Ran Out of Food in the Last Year: Never true  Transportation Needs: No Transportation Needs (08/27/2024)   Epic    Lack of Transportation (Medical): No    Lack of Transportation (Non-Medical): No  Physical Activity: Insufficiently Active (08/27/2024)   Exercise Vital Sign    Days of Exercise per Week: 3 days    Minutes of Exercise per Session: 20 min  Stress: No Stress Concern Present (08/27/2024)   [employer] of Occupational Health - Occupational Stress Questionnaire    Feeling of Stress: Only a little  Social Connections: Moderately Isolated (08/27/2024)   Social Connection and  Isolation Panel    Frequency of Communication with Friends and Family: More than three times a week    Frequency of Social Gatherings with Friends and Family: Once a week    Attends Religious Services: More than 4 times per year    Active Member of [employer] or Organizations: No    Attends Banker Meetings: Never    Marital Status: Widowed  Intimate Partner Violence: Not At Risk (08/27/2024)   Epic    Fear of Current or Ex-Partner: No    Emotionally Abused: No    Physically Abused: No    Sexually Abused: No  Depression (PHQ2-9): Low Risk (08/27/2024)   Depression (PHQ2-9)    PHQ-2 Score: 0  Alcohol Screen: Low Risk (08/27/2024)   Alcohol Screen    Last Alcohol Screening Score (AUDIT): 1  Housing: Unknown (08/27/2024)   Epic  Unable to Pay for Housing in the Last Year: No    Number of Times Moved in the Last Year: Not on file    Homeless in the Last Year: No  Utilities: Not At Risk (08/27/2024)   Epic    Threatened with loss of utilities: No  Health Literacy: Adequate Health Literacy (08/27/2024)   B1300 Health Literacy    Frequency of need for help with medical instructions: Never    FAMILY HISTORY: Family History  Problem Relation Age of Onset   Diabetes Mother    Hypertension Mother    Heart failure Mother    Cataracts Mother    Diabetes Father    Coronary artery disease Father    Retinitis pigmentosa Sister    Amblyopia Neg Hx    Blindness Neg Hx    Glaucoma Neg Hx    Macular degeneration Neg Hx    Retinal detachment Neg Hx    Strabismus Neg Hx     ALLERGIES:  is allergic to contrave [naltrexone-bupropion hcl er], metformin  and related, and penicillins.  MEDICATIONS:  Current Outpatient Medications  Medication Sig Dispense Refill   Aspirin -Caffeine (BC FAST PAIN RELIEF PO) Take 1 packet by mouth daily as needed (pain). (Patient not taking: Reported on 10/22/2024)     Blood Glucose Monitoring Suppl DEVI Use to check blood sugar up to 3 times a day. May  substitute to any manufacturer covered by patient's insurance. 1 each 0   carvedilol  (COREG ) 25 MG tablet Take 1 tablet (25 mg total) by mouth 2 (two) times daily with a meal. 180 tablet 3   cetirizine  (ZYRTEC ) 10 MG tablet Take 1 tablet (10 mg total) by mouth daily. 30 tablet 0   empagliflozin  (JARDIANCE ) 10 MG TABS tablet Take 1 tablet (10 mg total) by mouth daily before breakfast. 90 tablet 3   famotidine  (PEPCID ) 20 MG tablet Take 1 tablet (20 mg total) by mouth 2 (two) times daily. (Patient taking differently: Take 20 mg by mouth daily as needed for heartburn or indigestion.) 60 tablet 0   Glucose Blood (BLOOD GLUCOSE TEST STRIPS) STRP Use to check blood sugar up to 3 times a day. May substitute to any manufacturer covered by patient's insurance. 100 strip 3   hydrocortisone  1 % ointment Apply 1 Application topically 2 (two) times daily. 30 g 0   Iron , Ferrous Sulfate , 325 (65 Fe) MG TABS Take 325 mg by mouth daily. 90 tablet 1   Ketotifen Fumarate (ALLERGY  EYE DROPS OP) Place 1 drop into both eyes daily as needed (allergies).     Lancet Device MISC Use to check blood sugar up to 3 times a day. May substitute to any manufacturer covered by patient's insurance. 1 each 0   Lancets Misc. MISC Use to check blood sugar up to 3 times a day. May substitute to any manufacturer covered by patient's insurance. 100 each 3   rosuvastatin  (CRESTOR ) 20 MG tablet Take 1 tablet (20 mg total) by mouth daily. 90 tablet 3   Semaglutide , 1 MG/DOSE, 4 MG/3ML SOPN Inject 1 mg as directed once a week. 9 mL 1   spironolactone  (ALDACTONE ) 25 MG tablet Take 1 tablet (25 mg total) by mouth daily. 90 tablet 3   triamcinolone  cream (KENALOG ) 0.1 % Apply 1 Application topically 2 (two) times daily. 30 g 0   No current facility-administered medications for this visit.    REVIEW OF SYSTEMS:   All relevant systems were reviewed with the patient and are negative.  PHYSICAL EXAMINATION: ECOG PERFORMANCE STATUS: {CHL ONC  ECOG PS:[id number]}  There were no vitals filed for this visit. There were no vitals filed for this visit.  GENERAL: alert, no distress and comfortable SKIN: skin color normal EYES: normal conjunctiva, sclera clear LUNGS: normal breathing effort HEART: regular rate & rhythm ABDOMEN: abdomen soft, non-tender and nondistended  RADIOGRAPHIC STUDIES: I have personally reviewed the radiological images as listed and agreed with the findings in the report. ECHOCARDIOGRAM COMPLETE Result Date: 11/20/2024    ECHOCARDIOGRAM REPORT   Patient Name:   TIFFANI KADOW Date of Exam: 11/20/2024 Medical Rec #:  [id number]          Height:       62.0 in Accession #:    [id number]         Weight:       186.6 lb Date of Birth:  [birth year]          BSA:          1.856 m Patient Age:    67 years           BP:           126/88 mmHg Patient Gender: F                  HR:           71 bpm. Exam Location:  Outpatient Procedure: 2D Echo and Strain Analysis (Both Spectral and Color Flow Doppler            were utilized during procedure). Indications:    CHF  History:        Patient has prior history of Echocardiogram examinations. CHF;                 Risk Factors:Hypertension.  Sonographer:    Charmaine Gaskins Referring Phys: [phone] DALTON S MCLEAN IMPRESSIONS  1. There is no outflow tract obstruction at rest or following the Valsalva maneuver. Left ventricular ejection fraction, by estimation, is 50 to 55%. The left ventricle has low normal function. The left ventricle has no regional wall motion abnormalities. There is moderate concentric left ventricular hypertrophy. Left ventricular diastolic parameters are consistent with Grade I diastolic dysfunction (impaired relaxation).  2. Right ventricular systolic function is normal. The right ventricular size is normal. There is normal pulmonary artery systolic pressure. The estimated right ventricular systolic pressure is 23.6 mmHg.  3. Left atrial size was mild to moderately  dilated.  4. The mitral valve is degenerative. Trivial mitral valve regurgitation. No evidence of mitral stenosis.  5. The aortic valve is tricuspid. There is mild calcification of the aortic valve. Aortic valve regurgitation is not visualized. Aortic valve sclerosis/calcification is present, without any evidence of aortic stenosis.  6. The inferior vena cava is normal in size with greater than 50% respiratory variability, suggesting right atrial pressure of 3 mmHg. FINDINGS  Left Ventricle: There is no outflow tract obstruction at rest or following the Valsalva maneuver. Left ventricular ejection fraction, by estimation, is 50 to 55%. The left ventricle has low normal function. The left ventricle has no regional wall motion  abnormalities. The left ventricular internal cavity size was normal in size. There is moderate concentric left ventricular hypertrophy. Left ventricular diastolic parameters are consistent with Grade I diastolic dysfunction (impaired relaxation). Normal  left ventricular filling pressure. Right Ventricle: The right ventricular size is normal. No increase in right ventricular wall thickness. Right ventricular systolic function is normal. There is normal pulmonary artery  systolic pressure. The tricuspid regurgitant velocity is 2.27 m/s, and  with an assumed right atrial pressure of 3 mmHg, the estimated right ventricular systolic pressure is 23.6 mmHg. Left Atrium: Left atrial size was mild to moderately dilated. Right Atrium: Right atrial size was normal in size. Pericardium: There is no evidence of pericardial effusion. Mitral Valve: The mitral valve is degenerative in appearance. There is mild thickening of the mitral valve leaflet(s). Trivial mitral valve regurgitation. No evidence of mitral valve stenosis. Tricuspid Valve: The tricuspid valve is normal in structure. Tricuspid valve regurgitation is mild. Aortic Valve: The aortic valve is tricuspid. There is mild calcification of the aortic  valve. Aortic valve regurgitation is not visualized. Aortic valve sclerosis/calcification is present, without any evidence of aortic stenosis. Pulmonic Valve: The pulmonic valve was grossly normal. Pulmonic valve regurgitation is not visualized. No evidence of pulmonic stenosis. Aorta: The aortic root and ascending aorta are structurally normal, with no evidence of dilitation. Venous: The inferior vena cava is normal in size with greater than 50% respiratory variability, suggesting right atrial pressure of 3 mmHg. IAS/Shunts: No atrial level shunt detected by color flow Doppler.  LEFT VENTRICLE PLAX 2D LVIDd:         3.90 cm   Diastology LVIDs:         2.80 cm   LV e' medial:    5.66 cm/s LV PW:         1.37 cm   LV E/e' medial:  8.0 LV IVS:        1.65 cm   LV e' lateral:   6.74 cm/s LVOT diam:     2.12 cm   LV E/e' lateral: 6.7 LVOT Area:     3.53 cm  RIGHT VENTRICLE RV Basal diam:  1.81 cm RV Mid diam:    2.02 cm RV S prime:     11.30 cm/s TAPSE (M-mode): 1.8 cm LEFT ATRIUM             Index        RIGHT ATRIUM          Index LA diam:        3.38 cm 1.82 cm/m   RA Area:     9.77 cm LA Vol (A2C):   66.6 ml 35.88 ml/m  RA Volume:   15.60 ml 8.40 ml/m LA Vol (A4C):   48.9 ml 26.34 ml/m LA Biplane Vol: 58.6 ml 31.57 ml/m   AORTA Ao Root diam: 3.50 cm Ao Asc diam:  3.32 cm MITRAL VALVE                TRICUSPID VALVE MV Area (PHT): 4.89 cm     TR Peak grad:   20.6 mmHg MV Decel Time: 155 msec     TR Vmax:        227.00 cm/s MV E velocity: 45.10 cm/s MV A velocity: 104.00 cm/s  SHUNTS MV E/A ratio:  0.43         Systemic Diam: 2.12 cm Jerel Croitoru MD Electronically signed by Jerel Balding MD Signature Date/Time: 11/20/2024/3:28:02 PM    Final

## 2024-12-04 NOTE — Telephone Encounter (Signed)
 Patient had to cancel appt today- Daughter unable to get off work to bring her in. She states her pain has eased up and she has had no new concerning symptoms. She will call back to reschedule once she knows she will have a ride. Denies numbness, tingling, color or temp changes, fever, or hot hard knot.  ED precautions understood.

## 2024-12-07 ENCOUNTER — Inpatient Hospital Stay

## 2024-12-10 ENCOUNTER — Ambulatory Visit

## 2024-12-21 ENCOUNTER — Encounter: Payer: Self-pay | Admitting: Nurse Practitioner

## 2024-12-21 ENCOUNTER — Ambulatory Visit (INDEPENDENT_AMBULATORY_CARE_PROVIDER_SITE_OTHER): Admitting: Nurse Practitioner

## 2024-12-21 ENCOUNTER — Ambulatory Visit
Admission: RE | Admit: 2024-12-21 | Discharge: 2024-12-21 | Disposition: A | Discharge status: Home / Self Care | Source: Ambulatory Visit | Attending: Nurse Practitioner | Admitting: Nurse Practitioner

## 2024-12-21 VITALS — BP 135/80 | HR 83 | Temp 98.0°F | Ht 62.0 in | Wt 188.0 lb

## 2024-12-21 DIAGNOSIS — M79661 Pain in right lower leg: Secondary | ICD-10-CM | POA: Diagnosis not present

## 2024-12-21 NOTE — Progress Notes (Signed)
 "  BP 135/80   Pulse 83   Temp 98 F (36.7 C)   Ht 5' 2 (1.575 m)   Wt 188 lb (85.3 kg)   SpO2 95%   BMI 34.39 kg/m    Subjective:    Patient ID: Dawn Rice, female    DOB: September 26, 1956, 68 y.o.   MRN: [id number]  HPI: Dawn Rice is a 68 y.o. female  Chief Complaint  Patient presents with   Leg Pain    Pt c/o chronic right leg pain.    Discussed the use of AI scribe software for clinical note transcription with the patient, who gave verbal consent to proceed.  History of Present Illness Dawn Rice is a 68 year old female with diabetes who presents with right leg pain.  Right leg pain - Severe pain originating behind the right knee and radiating down the leg - Pain intensity interferes with ambulation; requires gait adjustment to relieve pressure - No history of recent trauma, falls, or injury to the leg - Pain has persisted for an unspecified duration - Pain worsened approximately 2-3 weeks after arthritis diagnosis - Pain is distinct from prior arthritis pain, which is described as 'achy' - No associated swelling - No numbness or tingling; able to feel toes - No pain in back or buttocks  Response to treatment - Ibuprofen  provides no relief - Uses a massage machine for symptomatic relief  Arthritis - Diagnosed with arthritis; x-ray in October showed arthritic changes in right leg - Current pain is different from previous arthritis symptoms  Diabetes - Diabetes present; concerned about its impact on leg pain  Physical therapy history - Previously attended physical therapy for right leg arthritis - Current pain perceived as unrelated to prior arthritis diagnosis         08/27/2024   10:19 AM 07/29/2024   10:11 AM 11/27/2023    1:49 PM  Depression screen PHQ 2/9  Decreased Interest 0 0 0  Down, Depressed, Hopeless 0 0 0  PHQ - 2 Score 0 0 0  Altered sleeping 0  0  Tired, decreased energy 0  0  Change in appetite 0  0  Feeling bad or  failure about yourself  0  0  Trouble concentrating 0  0  Moving slowly or fidgety/restless 0  0  Suicidal thoughts 0  0  PHQ-9 Score 0   0   Difficult doing work/chores Not difficult at all       Data saved with a previous flowsheet row definition    Relevant past medical, surgical, family and social history reviewed and updated as indicated. Interim medical history since our last visit reviewed. Allergies and medications reviewed and updated.  Review of Systems  Ten systems reviewed and is negative except as mentioned in HPI      Objective:      BP 135/80   Pulse 83   Temp 98 F (36.7 C)   Ht 5' 2 (1.575 m)   Wt 188 lb (85.3 kg)   SpO2 95%   BMI 34.39 kg/m    Wt Readings from Last 3 Encounters:  12/21/24 188 lb (85.3 kg)  10/22/24 186 lb 9.6 oz (84.6 kg)  10/01/24 187 lb (84.8 kg)    Physical Exam GENERAL: Alert, cooperative, well developed, no acute distress HEENT: Normocephalic, normal oropharynx, moist mucous membranes CHEST: Clear to auscultation bilaterally, no wheezes, rhonchi, or crackles CARDIOVASCULAR: Normal heart rate and rhythm, S1 and S2 normal without murmurs  ABDOMEN: Soft, non-tender, non-distended, without organomegaly, normal bowel sounds EXTREMITIES: No cyanosis or edema MUSCULOSKELETAL: Homan's sign positive on the right lower extremity NEUROLOGICAL: Cranial nerves grossly intact, moves all extremities without gross motor or sensory deficit  Results for orders placed or performed during the hospital encounter of 11/20/24  ECHOCARDIOGRAM COMPLETE   Collection Time: 11/20/24  2:30 PM  Result Value Ref Range   S' Lateral 2.80 cm   Area-P 1/2 4.89 cm2   Est EF 50 - 55%           Assessment & Plan:   Problem List Items Addressed This Visit   None Visit Diagnoses       Pain in right lower leg    -  Primary   Relevant Orders   US  Venous Img Lower Unilateral Right        Assessment and Plan Assessment & Plan Right lower extremity  pain, rule out deep vein thrombosis Chronic right lower extremity pain with radiation from the thigh to the leg, exacerbated by movement, without swelling. Differential diagnosis includes deep vein thrombosis (DVT) and possible cyst causing nerve compression. Positive Homan's test on the right lower leg raises concern for DVT. Pain is distinct from previous arthritis diagnosis, described as non-achy and severe, impacting mobility. - Ordered ultrasound of the right lower extremity to evaluate for DVT and other potential causes of pain.        Follow up plan: No follow-ups on file. "

## 2024-12-22 ENCOUNTER — Ambulatory Visit: Payer: Self-pay | Admitting: Nurse Practitioner

## 2024-12-22 DIAGNOSIS — M79661 Pain in right lower leg: Secondary | ICD-10-CM

## 2024-12-23 ENCOUNTER — Ambulatory Visit: Admitting: Family

## 2024-12-23 ENCOUNTER — Other Ambulatory Visit

## 2024-12-23 ENCOUNTER — Other Ambulatory Visit (INDEPENDENT_AMBULATORY_CARE_PROVIDER_SITE_OTHER): Payer: Self-pay

## 2024-12-23 DIAGNOSIS — M5417 Radiculopathy, lumbosacral region: Secondary | ICD-10-CM | POA: Diagnosis not present

## 2024-12-23 DIAGNOSIS — M1711 Unilateral primary osteoarthritis, right knee: Secondary | ICD-10-CM | POA: Diagnosis not present

## 2024-12-23 MED ORDER — PREDNISONE 50 MG PO TABS: ORAL_TABLET | ORAL | 0 refills | Status: DC

## 2024-12-23 NOTE — Progress Notes (Signed)
 "  Office Visit Note   Patient: Dawn Rice           Date of Birth: 1956/01/08           MRN: [id number] Visit Date: 12/23/2024              Requested by: Gareth Mliss FALCON, FNP [address] [place],  [place] [address] PCP: Gareth Mliss FALCON, FNP  Chief Complaint  Patient presents with   Right Leg - Pain      HPI: The patient is a 68 year old woman who is seen today for initial evaluation of right lower extremity pain.  She reports that she originally began having knee pain several weeks ago and about 2 weeks later began having throbbing and shooting pain in the anterior aspect of the entire right lower extremity denies any associated numbness or tingling or weakness does endorse some heaviness but has no difficulty with ambulation.  She has had some sensation of giving way of the right lower extremity difficulty sleeping on her sides   She reports a history of osteoarthritis bilateral knees worse on the right  Has had a visit to the ED for this concern and did have a ultrasound rule out DVT on December 29 which was negative Unable to view radiographs of the knees from that day  Assessment & Plan: Visit Diagnoses: No diagnosis found.  Plan: Suspect lumbar radiculopathy right sided placed on a prednisone  burst.  She will follow-up in the office in 4 weeks if she fails to improve.  Follow-Up Instructions: No follow-ups on file.   Right Knee Exam   Tenderness  The patient is experiencing tenderness in the medial joint line.  Range of Motion  The patient has normal right knee ROM.  Other  Erythema: absent Effusion: no effusion present   Back Exam   Tenderness  The patient is experiencing no tenderness.   Muscle Strength  The patient has normal back strength.  Tests  Straight leg raise right: positive Straight leg raise left: negative  Other  Gait: normal       Patient is alert, oriented, no adenopathy, well-dressed, normal affect, normal  respiratory effort.     Imaging: No results found. No images are attached to the encounter.  Labs: Lab Results  Component Value Date   HGBA1C 6.8 (H) 07/07/2024   HGBA1C 6.8 (H) 03/02/2024   HGBA1C 7.2 (A) 11/27/2023   ESRSEDRATE 6 10/01/2024   ESRSEDRATE 8 04/19/2008   CRP <3.0 10/01/2024   CRP 1.0 (H) 03/01/2010   REPTSTATUS 07/28/2010 FINAL 07/22/2010   CULT NO GROWTH 5 DAYS 07/22/2010     Lab Results  Component Value Date   ALBUMIN 3.9 08/03/2021   ALBUMIN 3.7 07/21/2017   ALBUMIN 3.5 01/19/2017    Lab Results  Component Value Date   MG 1.6 11/22/2022   MG 1.6 09/05/2010   MG 2.1 01/25/2008   Lab Results  Component Value Date   VD25OH 26 (L) 10/01/2024   VD25OH 7 (L) 03/02/2024   VD25OH 8 (L) 11/27/2023    No results found for: PREALBUMIN    Latest Ref Rng & Units 10/01/2024   12:15 PM 07/07/2024   11:43 AM 03/02/2024   10:43 AM  CBC EXTENDED  WBC 3.8 - 10.8 Thousand/uL 4.8  4.6  6.4   RBC 3.80 - 5.10 Million/uL 4.91  4.60  4.94   Hemoglobin 11.7 - 15.5 g/dL 86.9  88.5  88.3   HCT 35.0 -  45.0 % 41.1  38.4  38.8   Platelets 140 - 400 Thousand/uL 314  316  360   NEUT# 1,500 - 7,800 cells/uL 2,198  2,254  3,328      There is no height or weight on file to calculate BMI.  Orders:  No orders of the defined types were placed in this encounter.  No orders of the defined types were placed in this encounter.    Procedures: No procedures performed  Clinical Data: No additional findings.  ROS:  All other systems negative, except as noted in the HPI. Review of Systems  Objective: Vital Signs: There were no vitals taken for this visit.  Specialty Comments:  No specialty comments available.  PMFS History: Patient Active Problem List   Diagnosis Date Noted   Iron  deficiency anemia secondary to inadequate dietary iron  intake 10/14/2024   OSA (obstructive sleep apnea) 07/07/2024   Chronic congestive heart failure (HCC) 06/25/2023   Coronary  artery disease involving native coronary artery of native heart 09/16/2022   Obesity (BMI 30-39.9) 09/16/2022   House dust mite allergy  07/08/2019   Essential hypertension, benign    Hyperlipidemia 01/19/2017   Diabetes (HCC) 01/19/2017   Seizures (HCC) 01/19/2017   Gout    Past Medical History:  Diagnosis Date   Cataract    Epilepsy (HCC)    Gout    History of blood transfusion    when I got my hysterectomy (07/22/2017)   Hyperlipidemia    Hypertension    Seizures (HCC)    from epilepsy; the kind where I space out; last one was ~ 1 yr ago (07/22/2017)   Type II diabetes mellitus (HCC)     Family History  Problem Relation Age of Onset   Diabetes Mother    Hypertension Mother    Heart failure Mother    Cataracts Mother    Diabetes Father    Coronary artery disease Father    Retinitis pigmentosa Sister    Amblyopia Neg Hx    Blindness Neg Hx    Glaucoma Neg Hx    Macular degeneration Neg Hx    Retinal detachment Neg Hx    Strabismus Neg Hx     Past Surgical History:  Procedure Laterality Date   ABDOMINAL HYSTERECTOMY     APPENDECTOMY     CATARACT EXTRACTION     CATARACT EXTRACTION W/ INTRAOCULAR LENS  IMPLANT, BILATERAL Bilateral 2016-2017   right-left   CHOLECYSTECTOMY  03/2009   lap/notes 04/25/2011   LAPAROSCOPIC APPENDECTOMY N/A 07/21/2017   Procedure: APPENDECTOMY LAPAROSCOPIC;  Surgeon: Belinda Cough, MD;  Location: [facility] OR;  Service: General;  Laterality: N/A;   RIGHT/LEFT HEART CATH AND CORONARY ANGIOGRAPHY N/A 03/29/2023   Procedure: RIGHT/LEFT HEART CATH AND CORONARY ANGIOGRAPHY;  Surgeon: Rolan Ezra RAMAN, MD;  Location: [facility];  Service: Cardiovascular;  Laterality: N/A;   TUBAL LIGATION     Social History   Occupational History   Not on file  Tobacco Use   Smoking status: Former    Current packs/day: 0.00    Average packs/day: 0.5 packs/day for 20.0 years (10.0 ttl pk-yrs)    Types: Cigarettes    Start date: 49    Quit date: 2009     Years since quitting: 17.0   Smokeless tobacco: Never  Vaping Use   Vaping status: Never Used  Substance and Sexual Activity   Alcohol use: Yes    Comment: 07/22/2017 glass of wine maybe once/month   Drug use: No  Sexual activity: Not Currently       "

## 2025-01-01 ENCOUNTER — Encounter: Payer: Self-pay | Admitting: Family

## 2025-01-01 NOTE — Addendum Note (Signed)
 Addended by: Jewelianna Pancoast R on: 01/01/2025 09:44 AM   Modules accepted: Level of Service

## 2025-01-04 ENCOUNTER — Ambulatory Visit

## 2025-01-04 NOTE — Progress Notes (Unsigned)
 "  S:     Reason for visit: ?  Dawn Rice is a 69 y.o. female with a history of diabetes (type 2), who presents today for a follow up diabetes pharmacotherapy visit.? Pertinent PMH also includes HLD, gout, seizures, obesity, CAD, HTN and HFpEF.   They were referred to the pharmacist by their PCP for assistance in managing diabetes.  Care Team: Primary Care Provider: Gareth Mliss FALCON, FNP  At last visit with PCP on 07/29/24, BP was elevated at 150/88 mmHg. She reported not taking her BP medications the night prior to that appointment.   Current diabetes medications include: Ozempic  1 mg weekly, Jardiance  10 mg daily  Previous diabetes medications include: Rybelsus  (cost), metformin  (nausea), Janumet   Current hypertension medications include: carvedilol  25 mg BID, spironolactone  25 mg daily  Current hyperlipidemia medications include: rosuvastatin  20 mg daily   Patient reports adherence to all of her PM medications. Reports missing doses of her AM medications a couple times per week (carvedilol  and Jardiance )  Have you been experiencing any side effects to the medications prescribed? no Do you have any problems obtaining medications due to transportation or finances? no Insurance coverage: [facility] Medicare  Patient denies hypoglycemic events.  Reported home fasting blood sugars: 90-100s   Patient-reported exercise habits: stays busy with her great-grandchildren  Hypertension: Patient has a home BP cuff Current blood pressure readings readings: not checking   DM Prevention:  Statin: Taking; high intensity.?  History of albuminuria? yes, last UACR on 07/07/24 = 116 mg/g Last eye exam:  Lab Results  Component Value Date   HMDIABEYEEXA No Retinopathy 05/20/2024   Tobacco Use:  Tobacco Use: Medium Risk (01/01/2025)   Patient History    Smoking Tobacco Use: Former    Smokeless Tobacco Use: Never    Passive Exposure: Not on file   O:  Vitals:  Wt Readings from Last 3  Encounters:  12/21/24 188 lb (85.3 kg)  10/22/24 186 lb 9.6 oz (84.6 kg)  10/01/24 187 lb (84.8 kg)   BP Readings from Last 3 Encounters:  12/21/24 135/80  10/22/24 126/88  10/01/24 122/70   Pulse Readings from Last 3 Encounters:  12/21/24 83  10/22/24 87  10/01/24 88     Labs:?  Lab Results  Component Value Date   HGBA1C 6.8 (H) 07/07/2024   HGBA1C 6.8 (H) 03/02/2024   HGBA1C 7.2 (A) 11/27/2023   GLUCOSE 125 (H) 10/01/2024   MICRALBCREAT 116 (H) 07/07/2024   MICRALBCREAT 240 (H) 06/25/2023   CREATININE 1.27 (H) 10/01/2024   CREATININE 0.91 07/13/2024   CREATININE 0.94 07/01/2024    Lab Results  Component Value Date   CHOL 147 07/01/2024   LDLCALC 74 07/01/2024   LDLCALC 79 06/25/2023   LDLCALC 69 03/14/2023   HDL 48 07/01/2024   TRIG 123 07/01/2024   TRIG 112 06/25/2023   TRIG 110 03/14/2023   ALT 9 10/01/2024   ALT 10 03/02/2024   AST 16 10/01/2024   AST 18 03/02/2024      Chemistry      Component Value Date/Time   NA 143 10/01/2024 1215   NA 143 11/22/2022 1010   K 4.8 10/01/2024 1215   CL 109 10/01/2024 1215   CO2 26 10/01/2024 1215   BUN 17 10/01/2024 1215   BUN 14 11/22/2022 1010   CREATININE 1.27 (H) 10/01/2024 1215      Component Value Date/Time   CALCIUM  9.7 10/01/2024 1215   ALKPHOS 60 08/03/2021 1748   AST  16 10/01/2024 1215   ALT 9 10/01/2024 1215   BILITOT 0.2 10/01/2024 1215       The 10-year ASCVD risk score (Arnett DK, et al., 2019) is: 19.1%  Lab Results  Component Value Date   MICRALBCREAT 116 (H) 07/07/2024   MICRALBCREAT 240 (H) 06/25/2023    A/P: Hypertension longstanding currently uncontrolled based on recent elevated office BP readings. Blood pressure goal of <130/80 mmHg. Medication adherence suboptimal for carvedilol  AM dose. Patient is not interested in discussing the addition of new medication therapies.  -Continued carvedilol  25 mg BID.  -Continued spironolactone  25 mg daily -Encouraged monitoring BP 2-3x per  week at least an hour after medications  Diabetes currently controlled with a most recent A1c of 6.8% on 07/07/24. Patient is able to verbalize appropriate hypoglycemia management plan. Medication adherence appears suboptimal. Patient reports needing an updated meter and testing supplies -Continued GLP-1 Ozempic  (semaglutide ) 1 mg weekly -Continued SGLT2-I Jardiance  (empagliflozin ) 10 mg daily.  -Extensively discussed pathophysiology of diabetes, recommended lifestyle interventions, dietary effects on blood sugar control.  -Counseled on s/sx of and management of hypoglycemia.  -Sent Rx for BG testing supplies  ASCVD risk - secondary prevention in patient with diabetes. Last LDL is 74 mg/dL, not at goal of <44 mg/dL.  -Continued rosuvastatin  20 mg daily. Refill sent to the pharmacy  MISC: patient reported c/o hot flashes overnight. Encouraged patient to schedule follow up with PCP. Patient vocalized understanding.   Patient verbalized understanding of treatment plan. Total time patient counseling 30 minutes.  Follow-up:  Pharmacist on 01/04/25  Peyton CHARLENA Ferries, PharmD, BCACP, CPP Clinical Pharmacist [facility] [phone]    "

## 2025-01-06 ENCOUNTER — Encounter: Payer: Self-pay | Admitting: Family

## 2025-01-06 ENCOUNTER — Ambulatory Visit: Admitting: Family

## 2025-01-06 DIAGNOSIS — M25561 Pain in right knee: Secondary | ICD-10-CM

## 2025-01-06 DIAGNOSIS — R202 Paresthesia of skin: Secondary | ICD-10-CM | POA: Diagnosis not present

## 2025-01-06 DIAGNOSIS — G8929 Other chronic pain: Secondary | ICD-10-CM

## 2025-01-06 NOTE — Progress Notes (Signed)
 "  Office Visit Note   Patient: Dawn Rice           Date of Birth: 06-10-1956           MRN: [id number] Visit Date: 01/06/2025              Requested by: Gareth Mliss FALCON, FNP [address] [place],  [place] [address] PCP: Gareth Mliss FALCON, FNP  No chief complaint on file.     HPI: The patient is a 69 year old woman who returns for ongoing right lower extremity pain specifically the posterior knee she is having some radiating pain in the posterior thigh and calf with giving way at the knee she has worse pain and the sensation of giving way with weightbearing on the right  Difficulty getting comfortable for sleep  While she was on prednisone  she had full relief of her symptoms however when she stopped taking it she had return of her pain on the right lower extremity  History of type 2 diabetes  Assessment & Plan: Visit Diagnoses: No diagnosis found.  Plan: Will proceed with lumbar MRI. followed by referral to Dr. Eldonna.  Follow-Up Instructions: No follow-ups on file.   Right Knee Exam   Muscle Strength  The patient has normal right knee strength.  Tenderness  The patient is experiencing no tenderness.   Range of Motion  The patient has normal right knee ROM.  Tests  Varus: negative Valgus: negative  Other  Erythema: absent Swelling: none Effusion: no effusion present   Back Exam   Tenderness  The patient is experiencing no tenderness.   Muscle Strength  The patient has normal back strength.  Tests  Straight leg raise right: positive  Other  Gait: antalgic       Patient is alert, oriented, no adenopathy, well-dressed, normal affect, normal respiratory effort.     Imaging: No results found. No images are attached to the encounter.  Labs: Lab Results  Component Value Date   HGBA1C 6.8 (H) 07/07/2024   HGBA1C 6.8 (H) 03/02/2024   HGBA1C 7.2 (A) 11/27/2023   ESRSEDRATE 6 10/01/2024   ESRSEDRATE 8 04/19/2008   CRP  <3.0 10/01/2024   CRP 1.0 (H) 03/01/2010   REPTSTATUS 07/28/2010 FINAL 07/22/2010   CULT NO GROWTH 5 DAYS 07/22/2010     Lab Results  Component Value Date   ALBUMIN 3.9 08/03/2021   ALBUMIN 3.7 07/21/2017   ALBUMIN 3.5 01/19/2017    Lab Results  Component Value Date   MG 1.6 11/22/2022   MG 1.6 09/05/2010   MG 2.1 01/25/2008   Lab Results  Component Value Date   VD25OH 26 (L) 10/01/2024   VD25OH 7 (L) 03/02/2024   VD25OH 8 (L) 11/27/2023    No results found for: PREALBUMIN    Latest Ref Rng & Units 10/01/2024   12:15 PM 07/07/2024   11:43 AM 03/02/2024   10:43 AM  CBC EXTENDED  WBC 3.8 - 10.8 Thousand/uL 4.8  4.6  6.4   RBC 3.80 - 5.10 Million/uL 4.91  4.60  4.94   Hemoglobin 11.7 - 15.5 g/dL 86.9  88.5  88.3   HCT 35.0 - 45.0 % 41.1  38.4  38.8   Platelets 140 - 400 Thousand/uL 314  316  360   NEUT# 1,500 - 7,800 cells/uL 2,198  2,254  3,328      There is no height or weight on file to calculate BMI.  Orders:  No orders of the defined  types were placed in this encounter.  No orders of the defined types were placed in this encounter.    Procedures: No procedures performed  Clinical Data: No additional findings.  ROS:  All other systems negative, except as noted in the HPI. Review of Systems  Objective: Vital Signs: There were no vitals taken for this visit.  Specialty Comments:  No specialty comments available.  PMFS History: Patient Active Problem List   Diagnosis Date Noted   Iron  deficiency anemia secondary to inadequate dietary iron  intake 10/14/2024   OSA (obstructive sleep apnea) 07/07/2024   Chronic congestive heart failure (HCC) 06/25/2023   Coronary artery disease involving native coronary artery of native heart 09/16/2022   Obesity (BMI 30-39.9) 09/16/2022   House dust mite allergy  07/08/2019   Essential hypertension, benign    Hyperlipidemia 01/19/2017   Diabetes (HCC) 01/19/2017   Seizures (HCC) 01/19/2017   Gout    Past  Medical History:  Diagnosis Date   Cataract    Epilepsy (HCC)    Gout    History of blood transfusion    when I got my hysterectomy (07/22/2017)   Hyperlipidemia    Hypertension    Seizures (HCC)    from epilepsy; the kind where I space out; last one was ~ 1 yr ago (07/22/2017)   Type II diabetes mellitus (HCC)     Family History  Problem Relation Age of Onset   Diabetes Mother    Hypertension Mother    Heart failure Mother    Cataracts Mother    Diabetes Father    Coronary artery disease Father    Retinitis pigmentosa Sister    Amblyopia Neg Hx    Blindness Neg Hx    Glaucoma Neg Hx    Macular degeneration Neg Hx    Retinal detachment Neg Hx    Strabismus Neg Hx     Past Surgical History:  Procedure Laterality Date   ABDOMINAL HYSTERECTOMY     APPENDECTOMY     CATARACT EXTRACTION     CATARACT EXTRACTION W/ INTRAOCULAR LENS  IMPLANT, BILATERAL Bilateral 2016-2017   right-left   CHOLECYSTECTOMY  03/2009   lap/notes 04/25/2011   LAPAROSCOPIC APPENDECTOMY N/A 07/21/2017   Procedure: APPENDECTOMY LAPAROSCOPIC;  Surgeon: Belinda Cough, MD;  Location: [facility] OR;  Service: General;  Laterality: N/A;   RIGHT/LEFT HEART CATH AND CORONARY ANGIOGRAPHY N/A 03/29/2023   Procedure: RIGHT/LEFT HEART CATH AND CORONARY ANGIOGRAPHY;  Surgeon: Rolan Ezra RAMAN, MD;  Location: [facility];  Service: Cardiovascular;  Laterality: N/A;   TUBAL LIGATION     Social History   Occupational History   Not on file  Tobacco Use   Smoking status: Former    Current packs/day: 0.00    Average packs/day: 0.5 packs/day for 20.0 years (10.0 ttl pk-yrs)    Types: Cigarettes    Start date: 68    Quit date: 2009    Years since quitting: 17.0   Smokeless tobacco: Never  Vaping Use   Vaping status: Never Used  Substance and Sexual Activity   Alcohol use: Yes    Comment: 07/22/2017 glass of wine maybe once/month   Drug use: No   Sexual activity: Not Currently       "

## 2025-01-12 ENCOUNTER — Other Ambulatory Visit: Payer: Self-pay | Admitting: Nurse Practitioner

## 2025-01-12 DIAGNOSIS — E1165 Type 2 diabetes mellitus with hyperglycemia: Secondary | ICD-10-CM

## 2025-01-13 ENCOUNTER — Inpatient Hospital Stay

## 2025-01-13 ENCOUNTER — Inpatient Hospital Stay: Admitting: Oncology

## 2025-01-13 VITALS — BP 137/93 | HR 72 | Temp 97.8°F | Resp 18 | Wt 185.5 lb

## 2025-01-13 DIAGNOSIS — D509 Iron deficiency anemia, unspecified: Secondary | ICD-10-CM | POA: Diagnosis not present

## 2025-01-13 DIAGNOSIS — D508 Other iron deficiency anemias: Secondary | ICD-10-CM

## 2025-01-13 LAB — CBC WITH DIFFERENTIAL (CANCER CENTER ONLY)
Abs Immature Granulocytes: 0.01 K/uL (ref 0.00–0.07)
Basophils Absolute: 0 K/uL (ref 0.0–0.1)
Basophils Relative: 1 %
Eosinophils Absolute: 0.3 K/uL (ref 0.0–0.5)
Eosinophils Relative: 7 %
HCT: 41.4 % (ref 36.0–46.0)
Hemoglobin: 13.3 g/dL (ref 12.0–15.0)
Immature Granulocytes: 0 %
Lymphocytes Relative: 39 %
Lymphs Abs: 2 K/uL (ref 0.7–4.0)
MCH: 27 pg (ref 26.0–34.0)
MCHC: 32.1 g/dL (ref 30.0–36.0)
MCV: 84.1 fL (ref 80.0–100.0)
Monocytes Absolute: 0.5 K/uL (ref 0.1–1.0)
Monocytes Relative: 10 %
Neutro Abs: 2.2 K/uL (ref 1.7–7.7)
Neutrophils Relative %: 43 %
Platelet Count: 256 K/uL (ref 150–400)
RBC: 4.92 MIL/uL (ref 3.87–5.11)
RDW: 15.3 % (ref 11.5–15.5)
WBC Count: 5.2 K/uL (ref 4.0–10.5)
nRBC: 0 % (ref 0.0–0.2)

## 2025-01-13 LAB — IRON AND IRON BINDING CAPACITY (CC-WL,HP ONLY)
Iron: 66 ug/dL (ref 28–170)
Saturation Ratios: 16 % (ref 10.4–31.8)
TIBC: 409 ug/dL (ref 250–450)
UIBC: 343 ug/dL

## 2025-01-13 LAB — CMP (CANCER CENTER ONLY)
ALT: 17 U/L (ref 0–44)
AST: 26 U/L (ref 15–41)
Albumin: 4.6 g/dL (ref 3.5–5.0)
Alkaline Phosphatase: 71 U/L (ref 38–126)
Anion gap: 12 (ref 5–15)
BUN: 17 mg/dL (ref 8–23)
CO2: 25 mmol/L (ref 22–32)
Calcium: 9.9 mg/dL (ref 8.9–10.3)
Chloride: 105 mmol/L (ref 98–111)
Creatinine: 1.03 mg/dL — ABNORMAL HIGH (ref 0.44–1.00)
GFR, Estimated: 60 mL/min — ABNORMAL LOW
Glucose, Bld: 77 mg/dL (ref 70–99)
Potassium: 5 mmol/L (ref 3.5–5.1)
Sodium: 143 mmol/L (ref 135–145)
Total Bilirubin: 0.2 mg/dL (ref 0.0–1.2)
Total Protein: 7.8 g/dL (ref 6.5–8.1)

## 2025-01-13 LAB — FERRITIN: Ferritin: 14 ng/mL (ref 11–307)

## 2025-01-13 LAB — FOLATE: Folate: 12.5 ng/mL

## 2025-01-13 LAB — VITAMIN B12: Vitamin B-12: 395 pg/mL (ref 180–914)

## 2025-01-13 NOTE — Telephone Encounter (Signed)
 Requested by interface surescripts.  Requested Prescriptions  Pending Prescriptions Disp Refills   OZEMPIC , 1 MG/DOSE, 4 MG/3ML SOPN [Pharmacy Med Name: OZEMPIC  1MG  PER DOSE (4MG /3ML) PFP] 9 mL 0    Sig: INJECT 1 MG UNDER THE SKIN ONE DAY A WEEK     Endocrinology:  Diabetes - GLP-1 Receptor Agonists - semaglutide  Failed - 01/13/2025 11:57 AM      Failed - HBA1C in normal range and within 180 days    Hgb A1c MFr Bld  Date Value Ref Range Status  07/07/2024 6.8 (H) <5.7 % Final    Comment:    For someone without known diabetes, a hemoglobin A1c value of 6.5% or greater indicates that they may have  diabetes and this should be confirmed with a follow-up  test. . For someone with known diabetes, a value <7% indicates  that their diabetes is well controlled and a value  greater than or equal to 7% indicates suboptimal  control. A1c targets should be individualized based on  duration of diabetes, age, comorbid conditions, and  other considerations. . Currently, no consensus exists regarding use of hemoglobin A1c for diagnosis of diabetes for children. .          Failed - Cr in normal range and within 360 days    Creat  Date Value Ref Range Status  10/01/2024 1.27 (H) 0.50 - 1.05 mg/dL Final   Creatinine, Urine  Date Value Ref Range Status  07/07/2024 57 20 - 275 mg/dL Final         Passed - Valid encounter within last 6 months    Recent Outpatient Visits           3 weeks ago Pain in right lower leg   [facility] Gareth Clarity F, FNP   3 months ago Iron  deficiency anemia secondary to inadequate dietary iron  intake   [facility] Gareth Clarity FALCON, FNP   3 months ago Other fatigue   [facility] Gareth Clarity FALCON, FNP   5 months ago Annual physical exam   [facility] Gareth Clarity F, FNP   6 months ago Type 2 diabetes mellitus with hyperglycemia, without long-term current  use of insulin  [facility])   [facility] Gareth Clarity FALCON, [place]

## 2025-01-13 NOTE — Progress Notes (Signed)
 "  [facility]  HEMATOLOGY CLINIC CONSULTATION NOTE   PATIENT NAME: Dawn Rice   MR#: [id number] DOB: 09-28-1956  DATE OF SERVICE: 01/13/2025  Patient Care Team: Gareth Mliss FALCON, FNP as PCP - General (Nurse Practitioner) Sheena Pugh, DO as PCP - Cardiology (Cardiology) [facility] Marsh, Allyson E, RPH-CPP (Pharmacist)  REASON FOR CONSULTATION/ CHIEF COMPLAINT:  Evaluation of anemia.  ASSESSMENT & PLAN:   Dawn Rice is a 69 y.o. lady with a past medical history of hypertension, dyslipidemia, type 2 diabetes mellitus, seizure disorder, congestive heart failure, gout, was referred to our service for evaluation of iron  deficiency anemia.    No problem-specific Assessment & Plan notes found for this encounter.   Since the cause of anemia seems to be obvious from iron  deficiency, I am not pursuing extensive workup at this time.  If inadequate response to iron  replacement is noted, we will pursue workup to rule out other etiologies.  I reviewed lab results and outside records for this visit and discussed relevant results with the patient. Diagnosis, plan of care and treatment options were also discussed in detail with the patient. Opportunity provided to ask questions and answers provided to her apparent satisfaction. Provided instructions to call our clinic with any problems, questions or concerns prior to return visit. I recommended to continue follow-up with PCP and sub-specialists. She verbalized understanding and agreed with the plan. No barriers to learning was detected.  Scarlettrose Costilow, MD [facility] - A DEPT OF [facility] [address] [place] [address] Dept: [phone] Dept Fax: [phone]  01/13/2025 3:00 PM  HISTORY OF PRESENT ILLNESS:  Discussed the use of AI scribe software for clinical note transcription with the patient, who gave verbal consent  to proceed.  History of Present Illness Dawn Rice is a 69 year old female with persistent iron  deficiency anemia who presents for hematology evaluation of refractory anemia.  She was referred in October 2025 for evaluation of iron  deficiency anemia. Despite daily oral iron  supplementation and a prior intravenous iron  infusion, she reports no significant improvement in iron  levels. She experiences constipation related to iron  therapy and intermittent lightheadedness, which she believes may be related to her iron  levels. Over the past several weeks, she has developed increased fatigue, characterized by hypersomnia and persistent tiredness, noted by her granddaughter as a decline from her baseline activity.  She denies gastrointestinal bleeding, including hematochezia or melena, and has not had a colonoscopy since her last normal study approximately one year ago. She reports no epistaxis, gingival bleeding, or other abnormal bleeding. She has not received blood transfusions in recent years, though she did receive one many years ago. She previously received an intravenous iron  infusion arranged by her cardiologist, but is unsure of the timing. She denies chest pain, palpitations, and dyspnea, but reports intermittent lightheadedness. She does not currently experience pica, though she previously craved oatmeal in large quantities, which resolved after starting iron  supplementation.  She takes oral iron  once daily and occasionally uses vitamin C drinks to aid absorption, but avoids orange juice due to concerns about hyperglycemia. She also takes a hair, skin, and nail supplement, and inquired about the relationship between iron  levels and changes in her fingernails, including discoloration. She does not use alcohol or tobacco.   MEDICAL HISTORY:  Past Medical History:  Diagnosis Date   Cataract    Epilepsy (HCC)    Gout  History of blood transfusion    when I got my hysterectomy (07/22/2017)    Hyperlipidemia    Hypertension    Seizures (HCC)    from epilepsy; the kind where I space out; last one was ~ 1 yr ago (07/22/2017)   Type II diabetes mellitus (HCC)     SURGICAL HISTORY: Past Surgical History:  Procedure Laterality Date   ABDOMINAL HYSTERECTOMY     APPENDECTOMY     CATARACT EXTRACTION     CATARACT EXTRACTION W/ INTRAOCULAR LENS  IMPLANT, BILATERAL Bilateral 2016-2017   right-left   CHOLECYSTECTOMY  03/2009   lap/notes 04/25/2011   LAPAROSCOPIC APPENDECTOMY N/A 07/21/2017   Procedure: APPENDECTOMY LAPAROSCOPIC;  Surgeon: Belinda Cough, MD;  Location: [facility] OR;  Service: General;  Laterality: N/A;   RIGHT/LEFT HEART CATH AND CORONARY ANGIOGRAPHY N/A 03/29/2023   Procedure: RIGHT/LEFT HEART CATH AND CORONARY ANGIOGRAPHY;  Surgeon: Rolan Ezra RAMAN, MD;  Location: [facility] INVASIVE CV LAB;  Service: Cardiovascular;  Laterality: N/A;   TUBAL LIGATION      SOCIAL HISTORY: She reports that she quit smoking about 17 years ago. Her smoking use included cigarettes. She started smoking about 37 years ago. She has a 10 pack-year smoking history. She has never used smokeless tobacco. She reports current alcohol use. She reports that she does not use drugs. Social History   Socioeconomic History   Marital status: Widowed    Spouse name: Not on file   Number of children: 4   Years of education: Not on file   Highest education level: Not on file  Occupational History   Not on file  Tobacco Use   Smoking status: Former    Current packs/day: 0.00    Average packs/day: 0.5 packs/day for 20.0 years (10.0 ttl pk-yrs)    Types: Cigarettes    Start date: 40    Quit date: 2009    Years since quitting: 17.0   Smokeless tobacco: Never  Vaping Use   Vaping status: Never Used  Substance and Sexual Activity   Alcohol use: Yes    Comment: 07/22/2017 glass of wine maybe once/month   Drug use: No   Sexual activity: Not Currently  Other Topics Concern   Not on file  Social History  Narrative   Widowed mother of 4.   Quit smoking in 2008.   Occasional alcohol.   Social Drivers of Health   Tobacco Use: Medium Risk (01/06/2025)   Patient History    Smoking Tobacco Use: Former    Smokeless Tobacco Use: Never    Passive Exposure: Not on file  Financial Resource Strain: Low Risk (08/27/2024)   Overall Financial Resource Strain (CARDIA)    Difficulty of Paying Living Expenses: Not hard at all  Food Insecurity: No Food Insecurity (01/13/2025)   Epic    Worried About Programme Researcher, Broadcasting/film/video in the Last Year: Never true    Ran Out of Food in the Last Year: Never true  Transportation Needs: No Transportation Needs (01/13/2025)   Epic    Lack of Transportation (Medical): No    Lack of Transportation (Non-Medical): No  Physical Activity: Insufficiently Active (08/27/2024)   Exercise Vital Sign    Days of Exercise per Week: 3 days    Minutes of Exercise per Session: 20 min  Stress: No Stress Concern Present (08/27/2024)   [employer] of Occupational Health - Occupational Stress Questionnaire    Feeling of Stress: Only a little  Social Connections: Moderately Isolated (08/27/2024)   Social Connection  and Isolation Panel    Frequency of Communication with Friends and Family: More than three times a week    Frequency of Social Gatherings with Friends and Family: Once a week    Attends Religious Services: More than 4 times per year    Active Member of [employer] or Organizations: No    Attends Banker Meetings: Never    Marital Status: Widowed  Intimate Partner Violence: Not At Risk (01/13/2025)   Epic    Fear of Current or Ex-Partner: No    Emotionally Abused: No    Physically Abused: No    Sexually Abused: No  Depression (PHQ2-9): Low Risk (01/13/2025)   Depression (PHQ2-9)    PHQ-2 Score: 0  Alcohol Screen: Low Risk (08/27/2024)   Alcohol Screen    Last Alcohol Screening Score (AUDIT): 1  Housing: Low Risk (01/13/2025)   Epic    Unable to Pay for Housing in the  Last Year: No    Number of Times Moved in the Last Year: 0    Homeless in the Last Year: No  Utilities: Not At Risk (01/13/2025)   Epic    Threatened with loss of utilities: No  Health Literacy: Adequate Health Literacy (08/27/2024)   B1300 Health Literacy    Frequency of need for help with medical instructions: Never    FAMILY HISTORY: Family History  Problem Relation Age of Onset   Diabetes Mother    Hypertension Mother    Heart failure Mother    Cataracts Mother    Diabetes Father    Coronary artery disease Father    Retinitis pigmentosa Sister    Amblyopia Neg Hx    Blindness Neg Hx    Glaucoma Neg Hx    Macular degeneration Neg Hx    Retinal detachment Neg Hx    Strabismus Neg Hx     ALLERGIES:  She is allergic to contrave [naltrexone-bupropion hcl er], metformin  and related, and penicillins.  MEDICATIONS:  Current Outpatient Medications  Medication Sig Dispense Refill   Blood Glucose Monitoring Suppl DEVI Use to check blood sugar up to 3 times a day. May substitute to any manufacturer covered by patient's insurance. 1 each 0   carvedilol  (COREG ) 25 MG tablet Take 1 tablet (25 mg total) by mouth 2 (two) times daily with a meal. 180 tablet 3   cetirizine  (ZYRTEC ) 10 MG tablet Take 1 tablet (10 mg total) by mouth daily. 30 tablet 0   empagliflozin  (JARDIANCE ) 10 MG TABS tablet Take 1 tablet (10 mg total) by mouth daily before breakfast. 90 tablet 3   famotidine  (PEPCID ) 20 MG tablet Take 1 tablet (20 mg total) by mouth 2 (two) times daily. (Patient taking differently: Take 20 mg by mouth daily as needed for heartburn or indigestion.) 60 tablet 0   Glucose Blood (BLOOD GLUCOSE TEST STRIPS) STRP Use to check blood sugar up to 3 times a day. May substitute to any manufacturer covered by patient's insurance. 100 strip 3   Iron , Ferrous Sulfate , 325 (65 Fe) MG TABS Take 325 mg by mouth daily. 90 tablet 1   Lancet Device MISC Use to check blood sugar up to 3 times a day. May  substitute to any manufacturer covered by patient's insurance. 1 each 0   rosuvastatin  (CRESTOR ) 20 MG tablet Take 1 tablet (20 mg total) by mouth daily. 90 tablet 3   Semaglutide , 1 MG/DOSE, (OZEMPIC , 1 MG/DOSE,) 4 MG/3ML SOPN INJECT 1 MG UNDER THE SKIN ONE DAY A WEEK  9 mL 0   spironolactone  (ALDACTONE ) 25 MG tablet Take 1 tablet (25 mg total) by mouth daily. 90 tablet 3   No current facility-administered medications for this visit.    REVIEW OF SYSTEMS:    Review of Systems - Oncology  All other pertinent systems were reviewed and were negative except as mentioned above.  PHYSICAL EXAMINATION:   Onc Performance Status - 01/13/25 1436       ECOG Perf Status   ECOG Perf Status Restricted in physically strenuous activity but ambulatory and able to carry out work of a light or sedentary nature, e.g., light house work, office work      KPS SCALE   KPS % SCORE Normal activity with effort, some s/s of disease          Vitals:   01/13/25 1410  BP: (!) 137/93  Pulse: 72  Resp: 18  Temp: 97.8 F (36.6 C)  SpO2: 97%   Filed Weights   01/13/25 1410  Weight: 185 lb 8 oz (84.1 kg)    Physical Exam Constitutional:      General: She is not in acute distress.    Appearance: Normal appearance.  HENT:     Head: Normocephalic and atraumatic.  Cardiovascular:     Rate and Rhythm: Normal rate.  Pulmonary:     Effort: Pulmonary effort is normal. No respiratory distress.  Abdominal:     General: There is no distension.  Neurological:     General: No focal deficit present.     Mental Status: She is alert and oriented to person, place, and time.  Psychiatric:        Mood and Affect: Mood normal.        Behavior: Behavior normal.     LABORATORY DATA:   I have reviewed the data as listed.  Results for orders placed or performed in visit on 01/13/25  CBC with Differential (Cancer Center Only)  Result Value Ref Range   WBC Count 5.2 4.0 - 10.5 K/uL   RBC 4.92 3.87 - 5.11  MIL/uL   Hemoglobin 13.3 12.0 - 15.0 g/dL   HCT 58.5 63.9 - 53.9 %   MCV 84.1 80.0 - 100.0 fL   MCH 27.0 26.0 - 34.0 pg   MCHC 32.1 30.0 - 36.0 g/dL   RDW 84.6 88.4 - 84.4 %   Platelet Count 256 150 - 400 K/uL   nRBC 0.0 0.0 - 0.2 %   Neutrophils Relative % 43 %   Neutro Abs 2.2 1.7 - 7.7 K/uL   Lymphocytes Relative 39 %   Lymphs Abs 2.0 0.7 - 4.0 K/uL   Monocytes Relative 10 %   Monocytes Absolute 0.5 0.1 - 1.0 K/uL   Eosinophils Relative 7 %   Eosinophils Absolute 0.3 0.0 - 0.5 K/uL   Basophils Relative 1 %   Basophils Absolute 0.0 0.0 - 0.1 K/uL   Immature Granulocytes 0 %   Abs Immature Granulocytes 0.01 0.00 - 0.07 K/uL  CMP (Cancer Center only)  Result Value Ref Range   Sodium 143 135 - 145 mmol/L   Potassium 5.0 3.5 - 5.1 mmol/L   Chloride 105 98 - 111 mmol/L   CO2 25 22 - 32 mmol/L   Glucose, Bld 77 70 - 99 mg/dL   BUN 17 8 - 23 mg/dL   Creatinine 8.96 (H) 9.55 - 1.00 mg/dL   Calcium  9.9 8.9 - 10.3 mg/dL   Total Protein 7.8 6.5 - 8.1 g/dL   Albumin  4.6 3.5 - 5.0 g/dL   AST 26 15 - 41 U/L   ALT 17 0 - 44 U/L   Alkaline Phosphatase 71 38 - 126 U/L   Total Bilirubin 0.2 0.0 - 1.2 mg/dL   GFR, Estimated 60 (L) >60 mL/min   Anion gap 12 5 - 15  Iron  and Iron  Binding Capacity ([facility] only)  Result Value Ref Range   Iron  66 28 - 170 ug/dL   TIBC 590 749 - 549 ug/dL   Saturation Ratios 16 10.4 - 31.8 %   UIBC 343 ug/dL  Ferritin  Result Value Ref Range   Ferritin 14 11 - 307 ng/mL  Vitamin B12  Result Value Ref Range   Vitamin B-12 395 180 - 914 pg/mL  Folate  Result Value Ref Range   Folate 12.5 >5.9 ng/mL     RADIOGRAPHIC STUDIES:  I have personally reviewed the radiological images as listed and agree with the findings in the report.  XR Knee 1-2 Views Right Result Date: 01/01/2025 Radiographs of the right knee show moderate medial joint space narrowing with mild patellar spurring.  No acute finding.  XR Lumbar Spine 2-3 Views Result Date:  01/01/2025 Radiographs of the lumbar spine are without spondylosis or spondylolisthesis.  No acute finding  US  Venous Img Lower Unilateral Right Result Date: 12/21/2024 CLINICAL DATA:  Right lower leg pain EXAM: RIGHT LOWER EXTREMITY VENOUS DOPPLER ULTRASOUND TECHNIQUE: Gray-scale sonography with compression, as well as color and duplex ultrasound, were performed to evaluate the deep venous system(s) from the level of the common femoral vein through the popliteal and proximal calf veins. COMPARISON:  None Available. FINDINGS: VENOUS Normal compressibility of the common femoral, superficial femoral, and popliteal veins, as well as the visualized calf veins. Visualized portions of profunda femoral vein and great saphenous vein unremarkable. No filling defects to suggest DVT on grayscale or color Doppler imaging. Doppler waveforms show normal direction of venous flow, normal respiratory plasticity and response to augmentation. Limited views of the contralateral common femoral vein are unremarkable. OTHER None. Limitations: none IMPRESSION: Negative. Electronically Signed   By: Wilkie Lent M.D.   On: 12/21/2024 17:22    Orders Placed This Encounter  Procedures   CBC with Differential (Cancer Center Only)    Standing Status:   Future    Expiration Date:   01/13/2026   CMP (Cancer Center only)    Standing Status:   Future    Expiration Date:   01/13/2026   Iron  and Iron  Binding Capacity ([facility] only)    Standing Status:   Future    Expiration Date:   01/13/2026   Ferritin    Standing Status:   Future    Expiration Date:   01/13/2026   Vitamin B12    Standing Status:   Future    Expiration Date:   01/13/2026   Folate    Standing Status:   Future    Expiration Date:   01/13/2026    Future Appointments  Date Time Provider Department Center  01/22/2025  5:30 PM [facility] MR 1 [id number] [facility]  01/28/2025  3:20 PM Rolan Ezra RAMAN, MD [facility] None  02/04/2025  3:30 PM Trudy Duwaine BRAVO, NP  [facility] None  05/19/2025  2:00 PM [facility] None  05/19/2025  2:30 PM Hubbard Seldon, Chinita, MD [facility] None  09/02/2025 10:10 AM [facility]AL WE[facility] VISIT [facility]     I spent a total of 42 minutes during this encounter with the patient including  review of chart and various tests results, discussions about plan of care and coordination of care plan.  This document was completed utilizing speech recognition software. Grammatical errors, random word insertions, pronoun errors, and incomplete sentences are an occasional consequence of this system due to software limitations, ambient noise, and hardware issues. Any formal questions or concerns about the content, text or information contained within the body of this dictation should be directly addressed to the provider for clarification.  "

## 2025-01-16 ENCOUNTER — Encounter: Payer: Self-pay | Admitting: Oncology

## 2025-01-16 ENCOUNTER — Emergency Department (HOSPITAL_COMMUNITY)
Admission: EM | Admit: 2025-01-16 | Discharge: 2025-01-16 | Disposition: A | Discharge status: Home / Self Care | Attending: Emergency Medicine | Admitting: Emergency Medicine

## 2025-01-16 ENCOUNTER — Emergency Department (HOSPITAL_COMMUNITY)

## 2025-01-16 ENCOUNTER — Encounter (HOSPITAL_COMMUNITY): Payer: Self-pay | Admitting: Emergency Medicine

## 2025-01-16 ENCOUNTER — Other Ambulatory Visit: Payer: Self-pay

## 2025-01-16 DIAGNOSIS — I10 Essential (primary) hypertension: Secondary | ICD-10-CM | POA: Insufficient documentation

## 2025-01-16 DIAGNOSIS — M79661 Pain in right lower leg: Secondary | ICD-10-CM

## 2025-01-16 DIAGNOSIS — G8929 Other chronic pain: Secondary | ICD-10-CM | POA: Insufficient documentation

## 2025-01-16 DIAGNOSIS — R030 Elevated blood-pressure reading, without diagnosis of hypertension: Secondary | ICD-10-CM

## 2025-01-16 DIAGNOSIS — I2510 Atherosclerotic heart disease of native coronary artery without angina pectoris: Secondary | ICD-10-CM | POA: Diagnosis not present

## 2025-01-16 DIAGNOSIS — M25561 Pain in right knee: Secondary | ICD-10-CM | POA: Diagnosis present

## 2025-01-16 DIAGNOSIS — E119 Type 2 diabetes mellitus without complications: Secondary | ICD-10-CM | POA: Diagnosis not present

## 2025-01-16 MED ORDER — OXYCODONE HCL 5 MG PO CAPS: 5.0000 mg | ORAL_CAPSULE | Freq: Four times a day (QID) | ORAL | 0 refills | Status: AC | PRN

## 2025-01-16 MED ORDER — SPIRONOLACTONE 12.5 MG HALF TABLET
25.0000 mg | ORAL_TABLET | Freq: Every day | ORAL | Status: DC
  Administered 2025-01-16: 25 mg via ORAL
  Filled 2025-01-16: qty 2

## 2025-01-16 MED ORDER — CARVEDILOL 12.5 MG PO TABS
25.0000 mg | ORAL_TABLET | Freq: Two times a day (BID) | ORAL | Status: DC
  Administered 2025-01-16: 25 mg via ORAL
  Filled 2025-01-16: qty 2

## 2025-01-16 MED ORDER — ALUM & MAG HYDROXIDE-SIMETH 200-200-20 MG/5ML PO SUSP
30.0000 mL | Freq: Once | ORAL | Status: AC
  Administered 2025-01-16: 30 mL via ORAL
  Filled 2025-01-16: qty 30

## 2025-01-16 MED ORDER — OXYCODONE HCL 5 MG PO TABS
5.0000 mg | ORAL_TABLET | Freq: Once | ORAL | Status: AC
  Administered 2025-01-16: 5 mg via ORAL
  Filled 2025-01-16: qty 1

## 2025-01-16 MED ORDER — FAMOTIDINE 20 MG PO TABS
20.0000 mg | ORAL_TABLET | Freq: Every day | ORAL | Status: DC | PRN
  Administered 2025-01-16: 20 mg via ORAL
  Filled 2025-01-16: qty 1

## 2025-01-16 NOTE — ED Provider Notes (Signed)
 " [facility] Provider Note   CSN: [id number] Arrival date & time: 01/16/25  9383     Patient presents with: Leg Pain and Hypertension   Dawn Rice is a 69 y.o. female patient with past medical history of HLD, HTN, DM, OSA reporting to ER with complaint of right knee/leg pain. Patient reports that she deals with chronic right knee pain that got worse yesterday, yesterday while walking outside her right knee suddenly gave out give her significant right knee pain. She primarily localizes pain to behind her right knee and radiating down right leg she reports mild swelling. Knee hurts worse with ROM. No fever, rash, numbness/tingling or back pain. No CP, SOB, cough. Found to be HTN, has not taken BP Rx today and reporting pain.    Leg Pain Hypertension       Prior to Admission medications  Medication Sig Start Date End Date Taking? Authorizing Provider  Blood Glucose Monitoring Suppl DEVI Use to check blood sugar up to 3 times a day. May substitute to any manufacturer covered by patient's insurance. 09/29/24   Gareth Mliss FALCON, FNP  carvedilol  (COREG ) 25 MG tablet Take 1 tablet (25 mg total) by mouth 2 (two) times daily with a meal. 03/10/24   Milford, Harlene HERO, FNP  cetirizine  (ZYRTEC ) 10 MG tablet Take 1 tablet (10 mg total) by mouth daily. 07/01/24   Pender, Julie F, FNP  empagliflozin  (JARDIANCE ) 10 MG TABS tablet Take 1 tablet (10 mg total) by mouth daily before breakfast. 07/01/24   Rolan Ezra RAMAN, MD  famotidine  (PEPCID ) 20 MG tablet Take 1 tablet (20 mg total) by mouth 2 (two) times daily. Patient taking differently: Take 20 mg by mouth daily as needed for heartburn or indigestion. 07/01/24   Pender, Julie F, FNP  Glucose Blood (BLOOD GLUCOSE TEST STRIPS) STRP Use to check blood sugar up to 3 times a day. May substitute to any manufacturer covered by patient's insurance. 09/29/24   Gareth Mliss FALCON, FNP  Iron , Ferrous Sulfate , 325 (65 Fe)  MG TABS Take 325 mg by mouth daily. 11/28/23   Gareth Mliss FALCON, FNP  Lancet Device MISC Use to check blood sugar up to 3 times a day. May substitute to any manufacturer covered by patient's insurance. 09/29/24   Gareth Mliss FALCON, FNP  rosuvastatin  (CRESTOR ) 20 MG tablet Take 1 tablet (20 mg total) by mouth daily. 09/29/24   Gareth Mliss FALCON, FNP  Semaglutide , 1 MG/DOSE, (OZEMPIC , 1 MG/DOSE,) 4 MG/3ML SOPN INJECT 1 MG UNDER THE SKIN ONE DAY A WEEK 01/13/25   Pender, Julie F, FNP  spironolactone  (ALDACTONE ) 25 MG tablet Take 1 tablet (25 mg total) by mouth daily. 07/01/24   Rolan Ezra RAMAN, MD    Allergies: Contrave [naltrexone-bupropion hcl er], Metformin  and related, and Penicillins    Review of Systems  Musculoskeletal:  Positive for arthralgias.    Updated Vital Signs BP (!) 180/100 (BP Location: Right Arm)   Pulse 67   Temp 98.1 F (36.7 C) (Oral)   Resp 20   Ht 5' 2 (1.575 m)   Wt 84.1 kg   SpO2 100%   BMI 33.91 kg/m   Physical Exam Vitals and nursing note reviewed.  Constitutional:      General: She is not in acute distress.    Appearance: She is not toxic-appearing.  HENT:     Head: Normocephalic and atraumatic.  Eyes:     General: No scleral icterus.  Conjunctiva/sclera: Conjunctivae normal.  Cardiovascular:     Rate and Rhythm: Normal rate and regular rhythm.     Pulses: Normal pulses.     Heart sounds: Normal heart sounds.  Pulmonary:     Effort: Pulmonary effort is normal. No respiratory distress.     Breath sounds: Normal breath sounds.  Abdominal:     General: Abdomen is flat. Bowel sounds are normal.     Palpations: Abdomen is soft.     Tenderness: There is no abdominal tenderness.  Musculoskeletal:     Right lower leg: Edema present.     Left lower leg: No edema.     Comments: Strong DP pulse. Compartments soft. No right knee rash/swelling. Good passive ROM with initial knee flexion. Significant TTP behind right knee and right calf.   Skin:    General:  Skin is warm and dry.     Findings: No lesion.  Neurological:     General: No focal deficit present.     Mental Status: She is alert and oriented to person, place, and time. Mental status is at baseline.     (all labs ordered are listed, but only abnormal results are displayed) Labs Reviewed - No data to display  EKG: None  Radiology: No results found.   Procedures   Medications Ordered in the ED  oxyCODONE  (Oxy IR/ROXICODONE ) immediate release tablet 5 mg (5 mg Oral Incomplete 01/16/25 0716)    Clinical Course as of 01/16/25 1002  Sat Jan 16, 2025  0932 Requesting home medications for GERD. Ordered. [JB]    Clinical Course User Index [JB] Dayne Chait, Warren SAILOR, PA-C                                 Medical Decision Making Amount and/or Complexity of Data Reviewed Radiology: ordered.  Risk OTC drugs. Prescription drug management.   This patient presents to the ED for concern of right leg pain, this involves an extensive number of treatment options, and is a complaint that carries with it a high risk of complications and morbidity.  The differential diagnosis includes DVT, CHF, cellulitis, tendinopathy, fracture, septic joint   Co morbidities that complicate the patient evaluation  Obstructive sleep apnea, coronary artery disease, hypertension, hyperlipidemia   Additional history obtained:  Additional history obtained from patient was seen January 06, 2025 with orthopedic surgery for complaint of chronic right knee pain.  She had complained of similar symptoms with ongoing right lower extremity pain specifically behind the posterior knee radiating down her posterior calf.  Worse when sleeping.  This is very similar to her current complaint today.    Imaging Studies ordered:  I ordered imaging studies including Right DVT study, Right knee x-ray  I independently visualized and interpreted imaging which showed no acute findings I agree with the radiologist  interpretation   Cardiac Monitoring: / EKG:  The patient was maintained on a cardiac monitor.      Problem List / ED Course / Critical interventions / Medication management  Patient presenting to emergency room after acute worsening of her chronic right knee pain.  She reports that yesterday when she was walking her right knee seemingly gave out.  Since then she has had pretty significant posterior right knee pain.  She has no severe pain with passive range of motion.  Mild swelling but no joint effusion or sign of trauma.  She is neurovascularly intact distally.  Given the  chronic nature will obtain DVT study to rule out blood clot.  Also obtaining x-ray to rule out acute fracture. I ordered medication including Oxycodone  for pain control. Home BP Rx for asymptomatic hypertension.  Reevaluation of the patient after these medicines showed that the patient improved I have reviewed the patients home medicines and have made adjustments as needed. Patient was provided with a walking assistive device and knee brace. Vitals are stable, patient is well appearing.  Suspect appropriate for discharge with close orthopedic follow-up.  Given return precautions.        Final diagnoses:  Chronic pain of right knee  Elevated blood pressure reading    ED Discharge Orders     None          Shermon Warren SAILOR, PA-C 01/16/25 1002    Tegeler, Lonni PARAS, MD 01/16/25 1528  "

## 2025-01-16 NOTE — ED Notes (Signed)
 Ortho tech called. Message left

## 2025-01-16 NOTE — ED Triage Notes (Signed)
 Per EMS pt from home, c/o 10/10 right knee pain that radiates down the leg when she bends the leg.  Pt reports on obvious injury.    Pt is also hypertensive, initially 210/122, 178/98 upon arrival.  EMT reports a 30 point bp difference in each arm.  Pt denies any chest pain.    86HR 18RR CBG 127

## 2025-01-16 NOTE — Discharge Instructions (Addendum)
 Please call your orthopedic doctor for continued right knee pain and further follow-up.  In the meantime I would recommend knee brace and pain control with primarily Tylenol  and ibuprofen .  You can also use ice and heat over the area of pain.  Your blood pressure is quite elevated here I recommend continuing your home medications and following up closely with your primary care.  Return to the ED with new or worsening symptoms.

## 2025-01-16 NOTE — Assessment & Plan Note (Signed)
 Chronic iron  deficiency anemia with persistent fatigue and lightheadedness despite daily oral iron  supplementation.   No evidence of overt blood loss or recent gastrointestinal bleeding.  Last colonoscopy was normal and not due for repeat for another year.   She experiences constipation with oral iron . Prior IV iron  infusions, but none recently. No recent transfusions.  Pica symptoms (oatmeal craving) improved with iron  therapy.   Persistent fatigue continues to impact daily activities.  Labs today actually showed normal hemoglobin of 13.3, hematocrit 41.4, MCV normal at 84.1.  White count and platelet count are also normal.  CMP unremarkable.  Iron  studies showed no evidence of iron  deficiency.  Ferritin is borderline low at 14.  Vitamin B12 and folic acid  were all within normal limits.  Currently I do not see any indication for IV iron .  She was advised to continue oral iron  supplements once daily with vitamin C to enhance iron  absorption.  - Provided education on vitamin C to enhance iron  absorption and discussed alternatives to orange juice due to concerns about glycemic control. - Recommended continuation of over-the-counter hair, skin, and nail supplements.  I will plan to see her in 4 months for follow-up with repeat labs.

## 2025-01-16 NOTE — Progress Notes (Signed)
 VASCULAR LAB    Right lower extremity venous duplex has been performed.  See CV proc for preliminary results.  Gave verbal report to Warren Shad, PA-C  Thomasa Heidler, RVT 01/16/2025, 9:58 AM

## 2025-01-16 NOTE — Care Management (Signed)
 Transition of Care [facility]) - Emergency Department Mini Assessment   Patient Details  Name: Dawn Rice MRN: [id number] Date of Birth: [birth year]  [facility] CM/SW Contact:    Corean JAYSON Canary, RN Phone Number: 01/16/2025, 10:28 AM   Clinical Narrative: Patient presented with knee pain. Knee sleeve placed by Ortho tech. The patient is requesting a walker. Walker ordered via [employer] for delivery to the ED   ED Mini Assessment: What brought you to the Emergency Department? : knee pain  Barriers to Discharge: ED No Barriers        Interventions which prevented an admission or readmission: DME Provided    Patient Contact and Communications        ,                 Admission diagnosis:  leg pain Patient Active Problem List   Diagnosis Date Noted   Iron  deficiency anemia secondary to inadequate dietary iron  intake 10/14/2024   OSA (obstructive sleep apnea) 07/07/2024   Chronic congestive heart failure (HCC) 06/25/2023   Coronary artery disease involving native coronary artery of native heart 09/16/2022   Obesity (BMI 30-39.9) 09/16/2022   House dust mite allergy  07/08/2019   Essential hypertension, benign    Hyperlipidemia 01/19/2017   Diabetes (HCC) 01/19/2017   Seizures (HCC) 01/19/2017   Gout    PCP:  Gareth Mliss FALCON, FNP Pharmacy:   [id number] - [place], [place] - [address] AT [facility] OF [address] [place] [address] Phone: [phone] Fax: [phone]  [id number] [place], [place] - [address] AT [facility] OF [address] & [facility] [address] [place] [address] Phone: [phone] Fax: [phone]  [id number] - [place], [place] - [address] AT [facility] [address] [place] [address] Phone: [phone] Fax: [phone]

## 2025-01-16 NOTE — Progress Notes (Signed)
 Orthopedic Tech Progress Note Patient Details:  Dawn Rice [birth year] [id number]  Ortho Devices Type of Ortho Device: Knee Sleeve Ortho Device/Splint Location: Right knee Ortho Device/Splint Interventions: Application   Post Interventions Patient Tolerated: Well  Dawn Rice [name] 01/16/2025, 10:10 AM

## 2025-01-16 NOTE — ED Notes (Signed)
 Patient transported to X-ray

## 2025-01-22 ENCOUNTER — Other Ambulatory Visit

## 2025-01-28 ENCOUNTER — Ambulatory Visit (HOSPITAL_COMMUNITY): Admission: RE | Admit: 2025-01-28 | Source: Ambulatory Visit | Admitting: Cardiology

## 2025-02-04 ENCOUNTER — Ambulatory Visit: Admitting: Physical Medicine and Rehabilitation

## 2025-02-05 ENCOUNTER — Other Ambulatory Visit

## 2025-04-01 ENCOUNTER — Ambulatory Visit (HOSPITAL_COMMUNITY): Admitting: Cardiology

## 2025-05-19 ENCOUNTER — Inpatient Hospital Stay: Admitting: Oncology

## 2025-05-19 ENCOUNTER — Inpatient Hospital Stay

## 2025-09-02 ENCOUNTER — Ambulatory Visit
